# Patient Record
Sex: Male | Born: 1935 | Race: Black or African American | Hispanic: No | Marital: Married | Smoking: Former smoker
Health system: Southern US, Community
[De-identification: ages and names within clinical notes are randomized; demographics above are authoritative.]

## PROBLEM LIST (undated history)

## (undated) DIAGNOSIS — K219 Gastro-esophageal reflux disease without esophagitis: Secondary | ICD-10-CM

## (undated) DIAGNOSIS — R059 Cough, unspecified: Secondary | ICD-10-CM

## (undated) DIAGNOSIS — T8859XA Other complications of anesthesia, initial encounter: Secondary | ICD-10-CM

## (undated) DIAGNOSIS — R05 Cough: Secondary | ICD-10-CM

## (undated) DIAGNOSIS — T4145XA Adverse effect of unspecified anesthetic, initial encounter: Secondary | ICD-10-CM

## (undated) DIAGNOSIS — M1990 Unspecified osteoarthritis, unspecified site: Secondary | ICD-10-CM

## (undated) DIAGNOSIS — E039 Hypothyroidism, unspecified: Secondary | ICD-10-CM

## (undated) DIAGNOSIS — C61 Malignant neoplasm of prostate: Secondary | ICD-10-CM

## (undated) DIAGNOSIS — E079 Disorder of thyroid, unspecified: Secondary | ICD-10-CM

## (undated) DIAGNOSIS — I10 Essential (primary) hypertension: Secondary | ICD-10-CM

## (undated) HISTORY — PX: KNEE ARTHROSCOPY: SHX127

## (undated) HISTORY — PX: CERVICAL SPINE SURGERY: SHX589

## (undated) HISTORY — PX: BACK SURGERY: SHX140

## (undated) HISTORY — PX: SHOULDER SURGERY: SHX246

## (undated) HISTORY — PX: APPENDECTOMY: SHX54

## (undated) HISTORY — PX: TONSILLECTOMY: SUR1361

---

## 1998-03-03 ENCOUNTER — Ambulatory Visit (HOSPITAL_COMMUNITY): Admission: RE | Admit: 1998-03-03 | Discharge: 1998-03-03 | Payer: Self-pay | Admitting: Internal Medicine

## 1999-12-12 ENCOUNTER — Ambulatory Visit (HOSPITAL_COMMUNITY): Admission: RE | Admit: 1999-12-12 | Discharge: 1999-12-12 | Payer: Self-pay | Admitting: Neurosurgery

## 1999-12-12 ENCOUNTER — Encounter: Payer: Self-pay | Admitting: Neurosurgery

## 1999-12-27 ENCOUNTER — Ambulatory Visit (HOSPITAL_COMMUNITY): Admission: RE | Admit: 1999-12-27 | Discharge: 1999-12-27 | Payer: Self-pay | Admitting: Neurosurgery

## 1999-12-27 ENCOUNTER — Encounter: Payer: Self-pay | Admitting: Neurosurgery

## 2000-01-14 ENCOUNTER — Encounter: Payer: Self-pay | Admitting: Neurosurgery

## 2000-01-14 ENCOUNTER — Ambulatory Visit (HOSPITAL_COMMUNITY): Admission: RE | Admit: 2000-01-14 | Discharge: 2000-01-14 | Payer: Self-pay | Admitting: Neurosurgery

## 2000-01-24 ENCOUNTER — Encounter: Payer: Self-pay | Admitting: Neurosurgery

## 2000-01-24 ENCOUNTER — Ambulatory Visit (HOSPITAL_COMMUNITY): Admission: RE | Admit: 2000-01-24 | Discharge: 2000-01-24 | Payer: Self-pay | Admitting: Neurosurgery

## 2000-04-11 ENCOUNTER — Encounter: Payer: Self-pay | Admitting: Neurosurgery

## 2000-04-15 ENCOUNTER — Encounter: Payer: Self-pay | Admitting: Neurosurgery

## 2000-04-15 ENCOUNTER — Inpatient Hospital Stay (HOSPITAL_COMMUNITY): Admission: RE | Admit: 2000-04-15 | Discharge: 2000-04-17 | Payer: Self-pay | Admitting: Neurosurgery

## 2001-01-14 ENCOUNTER — Other Ambulatory Visit: Admission: RE | Admit: 2001-01-14 | Discharge: 2001-01-14 | Payer: Self-pay | Admitting: Gastroenterology

## 2001-01-14 ENCOUNTER — Encounter (INDEPENDENT_AMBULATORY_CARE_PROVIDER_SITE_OTHER): Payer: Self-pay | Admitting: Specialist

## 2001-04-29 ENCOUNTER — Encounter: Payer: Self-pay | Admitting: Neurosurgery

## 2001-04-29 ENCOUNTER — Encounter: Admission: RE | Admit: 2001-04-29 | Discharge: 2001-04-29 | Payer: Self-pay | Admitting: Neurosurgery

## 2003-03-16 ENCOUNTER — Encounter: Payer: Self-pay | Admitting: Neurosurgery

## 2003-03-16 ENCOUNTER — Ambulatory Visit (HOSPITAL_COMMUNITY): Admission: RE | Admit: 2003-03-16 | Discharge: 2003-03-16 | Payer: Self-pay | Admitting: Neurosurgery

## 2003-03-29 ENCOUNTER — Encounter: Admission: RE | Admit: 2003-03-29 | Discharge: 2003-03-29 | Payer: Self-pay | Admitting: Neurosurgery

## 2003-03-29 ENCOUNTER — Encounter: Payer: Self-pay | Admitting: Neurosurgery

## 2003-03-31 ENCOUNTER — Encounter: Payer: Self-pay | Admitting: Neurosurgery

## 2003-03-31 ENCOUNTER — Ambulatory Visit (HOSPITAL_COMMUNITY): Admission: RE | Admit: 2003-03-31 | Discharge: 2003-03-31 | Payer: Self-pay | Admitting: Neurosurgery

## 2003-04-11 ENCOUNTER — Encounter: Payer: Self-pay | Admitting: Neurosurgery

## 2003-04-11 ENCOUNTER — Encounter: Admission: RE | Admit: 2003-04-11 | Discharge: 2003-04-11 | Payer: Self-pay | Admitting: Neurosurgery

## 2003-04-25 ENCOUNTER — Encounter: Payer: Self-pay | Admitting: Internal Medicine

## 2003-04-25 ENCOUNTER — Ambulatory Visit (HOSPITAL_COMMUNITY): Admission: RE | Admit: 2003-04-25 | Discharge: 2003-04-25 | Payer: Self-pay | Admitting: Internal Medicine

## 2003-06-20 ENCOUNTER — Ambulatory Visit (HOSPITAL_COMMUNITY): Admission: RE | Admit: 2003-06-20 | Discharge: 2003-06-20 | Payer: Self-pay | Admitting: Neurology

## 2003-06-29 ENCOUNTER — Encounter: Admission: RE | Admit: 2003-06-29 | Discharge: 2003-06-29 | Payer: Self-pay | Admitting: Neurosurgery

## 2003-07-26 ENCOUNTER — Inpatient Hospital Stay (HOSPITAL_COMMUNITY): Admission: RE | Admit: 2003-07-26 | Discharge: 2003-08-01 | Payer: Self-pay | Admitting: Neurosurgery

## 2003-10-12 ENCOUNTER — Ambulatory Visit (HOSPITAL_COMMUNITY): Admission: RE | Admit: 2003-10-12 | Discharge: 2003-10-12 | Payer: Self-pay | Admitting: Internal Medicine

## 2003-10-18 ENCOUNTER — Ambulatory Visit (HOSPITAL_BASED_OUTPATIENT_CLINIC_OR_DEPARTMENT_OTHER): Admission: RE | Admit: 2003-10-18 | Discharge: 2003-10-18 | Payer: Self-pay | Admitting: Internal Medicine

## 2003-10-27 ENCOUNTER — Inpatient Hospital Stay (HOSPITAL_COMMUNITY): Admission: RE | Admit: 2003-10-27 | Discharge: 2003-10-28 | Payer: Self-pay | Admitting: Urology

## 2003-10-27 ENCOUNTER — Encounter (INDEPENDENT_AMBULATORY_CARE_PROVIDER_SITE_OTHER): Payer: Self-pay | Admitting: Specialist

## 2004-04-10 ENCOUNTER — Ambulatory Visit (HOSPITAL_COMMUNITY): Admission: RE | Admit: 2004-04-10 | Discharge: 2004-04-10 | Payer: Self-pay | Admitting: Gastroenterology

## 2004-11-22 ENCOUNTER — Encounter: Admission: RE | Admit: 2004-11-22 | Discharge: 2004-11-22 | Payer: Self-pay | Admitting: Neurology

## 2005-03-03 ENCOUNTER — Encounter: Admission: RE | Admit: 2005-03-03 | Discharge: 2005-03-03 | Payer: Self-pay | Admitting: Neurology

## 2005-06-25 ENCOUNTER — Encounter: Admission: RE | Admit: 2005-06-25 | Discharge: 2005-06-25 | Payer: Self-pay | Admitting: Internal Medicine

## 2005-07-06 ENCOUNTER — Encounter: Admission: RE | Admit: 2005-07-06 | Discharge: 2005-07-06 | Payer: Self-pay | Admitting: Internal Medicine

## 2005-07-11 ENCOUNTER — Ambulatory Visit (HOSPITAL_COMMUNITY): Admission: RE | Admit: 2005-07-11 | Discharge: 2005-07-12 | Payer: Self-pay | Admitting: Orthopedic Surgery

## 2006-01-14 ENCOUNTER — Encounter: Admission: RE | Admit: 2006-01-14 | Discharge: 2006-01-14 | Payer: Self-pay | Admitting: Anesthesiology

## 2006-05-30 ENCOUNTER — Encounter: Admission: RE | Admit: 2006-05-30 | Discharge: 2006-05-30 | Payer: Self-pay | Admitting: Internal Medicine

## 2006-06-23 ENCOUNTER — Encounter: Admission: RE | Admit: 2006-06-23 | Discharge: 2006-06-23 | Payer: Self-pay | Admitting: Internal Medicine

## 2006-07-16 ENCOUNTER — Encounter: Admission: RE | Admit: 2006-07-16 | Discharge: 2006-07-16 | Payer: Self-pay | Admitting: Internal Medicine

## 2006-07-21 ENCOUNTER — Ambulatory Visit: Payer: Self-pay | Admitting: Internal Medicine

## 2006-08-08 ENCOUNTER — Ambulatory Visit (HOSPITAL_COMMUNITY): Admission: RE | Admit: 2006-08-08 | Discharge: 2006-08-08 | Payer: Self-pay | Admitting: Thoracic Surgery

## 2006-08-11 ENCOUNTER — Ambulatory Visit (HOSPITAL_COMMUNITY): Admission: RE | Admit: 2006-08-11 | Discharge: 2006-08-11 | Payer: Self-pay | Admitting: Thoracic Surgery

## 2006-09-04 ENCOUNTER — Ambulatory Visit: Payer: Self-pay | Admitting: Pulmonary Disease

## 2006-09-04 LAB — CONVERTED CEMR LAB
ALT: 18 units/L (ref 0–40)
AST: 43 units/L — ABNORMAL HIGH (ref 0–37)
Alkaline Phosphatase: 54 units/L (ref 39–117)
BUN: 26 mg/dL — ABNORMAL HIGH (ref 6–23)
Basophils Relative: 0.5 % (ref 0.0–1.0)
Calcium: 9.4 mg/dL (ref 8.4–10.5)
Eosinophils Relative: 0 % (ref 0.0–5.0)
GFR calc Af Amer: 56 mL/min
GFR calc non Af Amer: 46 mL/min
Lymphocytes Relative: 3.7 % — ABNORMAL LOW (ref 12.0–46.0)
Neutro Abs: 26.8 10*3/uL — ABNORMAL HIGH (ref 1.4–7.7)
Platelets: 240 10*3/uL (ref 150–400)
Potassium: 3.9 meq/L (ref 3.5–5.1)
RBC: 4.52 M/uL (ref 4.22–5.81)
Total Protein: 6.8 g/dL (ref 6.0–8.3)
WBC: 30.2 10*3/uL (ref 4.5–10.5)

## 2006-09-18 ENCOUNTER — Ambulatory Visit: Payer: Self-pay | Admitting: Internal Medicine

## 2006-09-18 LAB — CONVERTED CEMR LAB
BUN: 9 mg/dL (ref 6–23)
Basophils Relative: 0.6 % (ref 0.0–1.0)
CO2: 31 meq/L (ref 19–32)
Calcium: 8.7 mg/dL (ref 8.4–10.5)
Chloride: 106 meq/L (ref 96–112)
Eosinophils Relative: 0.4 % (ref 0.0–5.0)
GFR calc non Af Amer: 119 mL/min
Glucose, Bld: 113 mg/dL — ABNORMAL HIGH (ref 70–99)
Platelets: 413 10*3/uL — ABNORMAL HIGH (ref 150–400)
RBC: 3.85 M/uL — ABNORMAL LOW (ref 4.22–5.81)
WBC: 10.6 10*3/uL — ABNORMAL HIGH (ref 4.5–10.5)

## 2006-09-22 ENCOUNTER — Ambulatory Visit: Payer: Self-pay | Admitting: Cardiology

## 2006-09-27 ENCOUNTER — Ambulatory Visit: Payer: Self-pay | Admitting: Cardiothoracic Surgery

## 2006-09-27 ENCOUNTER — Inpatient Hospital Stay (HOSPITAL_COMMUNITY): Admission: EM | Admit: 2006-09-27 | Discharge: 2006-10-06 | Payer: Self-pay | Admitting: Emergency Medicine

## 2006-09-29 ENCOUNTER — Encounter (INDEPENDENT_AMBULATORY_CARE_PROVIDER_SITE_OTHER): Payer: Self-pay | Admitting: Specialist

## 2006-09-30 ENCOUNTER — Encounter: Payer: Self-pay | Admitting: Thoracic Surgery

## 2006-10-08 ENCOUNTER — Ambulatory Visit: Payer: Self-pay | Admitting: Thoracic Surgery

## 2006-10-08 ENCOUNTER — Encounter: Admission: RE | Admit: 2006-10-08 | Discharge: 2006-10-08 | Payer: Self-pay | Admitting: Thoracic Surgery

## 2006-10-14 ENCOUNTER — Ambulatory Visit: Payer: Self-pay | Admitting: Thoracic Surgery

## 2006-10-14 ENCOUNTER — Encounter: Admission: RE | Admit: 2006-10-14 | Discharge: 2006-10-14 | Payer: Self-pay | Admitting: Thoracic Surgery

## 2006-10-15 ENCOUNTER — Inpatient Hospital Stay (HOSPITAL_COMMUNITY): Admission: AD | Admit: 2006-10-15 | Discharge: 2006-10-18 | Payer: Self-pay | Admitting: Thoracic Surgery

## 2006-10-29 ENCOUNTER — Ambulatory Visit: Payer: Self-pay | Admitting: Thoracic Surgery

## 2006-10-29 ENCOUNTER — Encounter: Admission: RE | Admit: 2006-10-29 | Discharge: 2006-10-29 | Payer: Self-pay | Admitting: Thoracic Surgery

## 2006-11-05 ENCOUNTER — Ambulatory Visit: Payer: Self-pay | Admitting: Thoracic Surgery

## 2006-11-05 ENCOUNTER — Encounter: Admission: RE | Admit: 2006-11-05 | Discharge: 2006-11-05 | Payer: Self-pay | Admitting: Thoracic Surgery

## 2006-11-19 ENCOUNTER — Ambulatory Visit: Payer: Self-pay | Admitting: Thoracic Surgery

## 2006-11-19 ENCOUNTER — Encounter: Admission: RE | Admit: 2006-11-19 | Discharge: 2006-11-19 | Payer: Self-pay | Admitting: Thoracic Surgery

## 2006-12-10 ENCOUNTER — Ambulatory Visit: Payer: Self-pay | Admitting: Thoracic Surgery

## 2006-12-10 ENCOUNTER — Encounter: Admission: RE | Admit: 2006-12-10 | Discharge: 2006-12-10 | Payer: Self-pay | Admitting: Thoracic Surgery

## 2006-12-31 ENCOUNTER — Encounter: Admission: RE | Admit: 2006-12-31 | Discharge: 2006-12-31 | Payer: Self-pay | Admitting: Thoracic Surgery

## 2006-12-31 ENCOUNTER — Ambulatory Visit: Payer: Self-pay | Admitting: Thoracic Surgery

## 2007-01-28 ENCOUNTER — Ambulatory Visit: Payer: Self-pay | Admitting: Thoracic Surgery

## 2007-03-25 ENCOUNTER — Ambulatory Visit: Payer: Self-pay | Admitting: Thoracic Surgery

## 2007-03-25 ENCOUNTER — Encounter: Admission: RE | Admit: 2007-03-25 | Discharge: 2007-03-25 | Payer: Self-pay | Admitting: Thoracic Surgery

## 2007-08-13 HISTORY — PX: LUNG SURGERY: SHX703

## 2008-05-24 ENCOUNTER — Encounter: Admission: RE | Admit: 2008-05-24 | Discharge: 2008-08-11 | Payer: Self-pay | Admitting: Internal Medicine

## 2008-06-15 ENCOUNTER — Ambulatory Visit (HOSPITAL_COMMUNITY): Admission: RE | Admit: 2008-06-15 | Discharge: 2008-06-15 | Payer: Self-pay | Admitting: Internal Medicine

## 2008-08-12 HISTORY — PX: EYE SURGERY: SHX253

## 2009-02-24 ENCOUNTER — Inpatient Hospital Stay (HOSPITAL_COMMUNITY): Admission: RE | Admit: 2009-02-24 | Discharge: 2009-02-26 | Payer: Self-pay | Admitting: Neurosurgery

## 2009-04-26 ENCOUNTER — Encounter: Admission: RE | Admit: 2009-04-26 | Discharge: 2009-04-26 | Payer: Self-pay | Admitting: Thoracic Surgery

## 2009-04-26 ENCOUNTER — Ambulatory Visit: Payer: Self-pay | Admitting: Thoracic Surgery

## 2009-07-10 ENCOUNTER — Encounter: Admission: RE | Admit: 2009-07-10 | Discharge: 2009-07-10 | Payer: Self-pay | Admitting: Neurosurgery

## 2009-07-27 ENCOUNTER — Encounter: Admission: RE | Admit: 2009-07-27 | Discharge: 2009-07-27 | Payer: Self-pay | Admitting: Neurosurgery

## 2009-08-17 ENCOUNTER — Encounter: Admission: RE | Admit: 2009-08-17 | Discharge: 2009-08-17 | Payer: Self-pay | Admitting: Neurosurgery

## 2010-04-25 ENCOUNTER — Encounter: Admission: RE | Admit: 2010-04-25 | Discharge: 2010-04-25 | Payer: Self-pay | Admitting: Neurosurgery

## 2010-09-01 ENCOUNTER — Encounter: Payer: Self-pay | Admitting: Internal Medicine

## 2010-09-02 ENCOUNTER — Encounter: Payer: Self-pay | Admitting: Urology

## 2010-09-06 ENCOUNTER — Encounter
Admission: RE | Admit: 2010-09-06 | Discharge: 2010-09-06 | Payer: Self-pay | Source: Home / Self Care | Attending: Orthopaedic Surgery | Admitting: Orthopaedic Surgery

## 2010-10-20 ENCOUNTER — Emergency Department (HOSPITAL_COMMUNITY): Payer: Medicare Other

## 2010-10-20 ENCOUNTER — Inpatient Hospital Stay (HOSPITAL_COMMUNITY)
Admission: EM | Admit: 2010-10-20 | Discharge: 2010-10-26 | DRG: 689 | Disposition: A | Discharge status: Home Health | Payer: Medicare Other | Attending: Internal Medicine | Admitting: Internal Medicine

## 2010-10-20 DIAGNOSIS — R7301 Impaired fasting glucose: Secondary | ICD-10-CM | POA: Diagnosis present

## 2010-10-20 DIAGNOSIS — J189 Pneumonia, unspecified organism: Secondary | ICD-10-CM | POA: Diagnosis present

## 2010-10-20 DIAGNOSIS — D1809 Hemangioma of other sites: Secondary | ICD-10-CM | POA: Diagnosis present

## 2010-10-20 DIAGNOSIS — D638 Anemia in other chronic diseases classified elsewhere: Secondary | ICD-10-CM | POA: Diagnosis present

## 2010-10-20 DIAGNOSIS — Z66 Do not resuscitate: Secondary | ICD-10-CM | POA: Diagnosis present

## 2010-10-20 DIAGNOSIS — R627 Adult failure to thrive: Secondary | ICD-10-CM | POA: Diagnosis present

## 2010-10-20 DIAGNOSIS — E559 Vitamin D deficiency, unspecified: Secondary | ICD-10-CM | POA: Diagnosis present

## 2010-10-20 DIAGNOSIS — N390 Urinary tract infection, site not specified: Principal | ICD-10-CM | POA: Diagnosis present

## 2010-10-20 DIAGNOSIS — E860 Dehydration: Secondary | ICD-10-CM | POA: Diagnosis present

## 2010-10-20 DIAGNOSIS — A498 Other bacterial infections of unspecified site: Secondary | ICD-10-CM | POA: Diagnosis present

## 2010-10-20 DIAGNOSIS — I10 Essential (primary) hypertension: Secondary | ICD-10-CM | POA: Diagnosis present

## 2010-10-20 DIAGNOSIS — E876 Hypokalemia: Secondary | ICD-10-CM | POA: Diagnosis not present

## 2010-10-20 DIAGNOSIS — E039 Hypothyroidism, unspecified: Secondary | ICD-10-CM | POA: Diagnosis present

## 2010-10-20 DIAGNOSIS — N400 Enlarged prostate without lower urinary tract symptoms: Secondary | ICD-10-CM | POA: Diagnosis present

## 2010-10-20 DIAGNOSIS — R42 Dizziness and giddiness: Secondary | ICD-10-CM | POA: Diagnosis not present

## 2010-10-20 DIAGNOSIS — R5381 Other malaise: Secondary | ICD-10-CM | POA: Diagnosis present

## 2010-10-20 DIAGNOSIS — C61 Malignant neoplasm of prostate: Secondary | ICD-10-CM | POA: Diagnosis present

## 2010-10-20 HISTORY — DX: Malignant neoplasm of prostate: C61

## 2010-10-20 LAB — URINALYSIS, ROUTINE W REFLEX MICROSCOPIC
Glucose, UA: NEGATIVE mg/dL
Specific Gravity, Urine: 1.019 (ref 1.005–1.030)
Urobilinogen, UA: 1 mg/dL (ref 0.0–1.0)

## 2010-10-20 LAB — COMPREHENSIVE METABOLIC PANEL
AST: 32 U/L (ref 0–37)
Albumin: 2.8 g/dL — ABNORMAL LOW (ref 3.5–5.2)
BUN: 26 mg/dL — ABNORMAL HIGH (ref 6–23)
Calcium: 8.7 mg/dL (ref 8.4–10.5)
Creatinine, Ser: 1.28 mg/dL (ref 0.4–1.5)
GFR calc Af Amer: >60 mL/min (ref >60)
Total Bilirubin: 1.1 mg/dL (ref 0.3–1.2)
Total Protein: 6.4 g/dL (ref 6.0–8.3)

## 2010-10-20 LAB — CBC
MCH: 28.8 pg (ref 26.0–34.0)
MCHC: 32 g/dL (ref 30.0–36.0)
MCV: 90 fL (ref 78.0–100.0)
Platelets: 179 10*3/uL (ref 150–400)
RBC: 4.1 MIL/uL — ABNORMAL LOW (ref 4.22–5.81)
RDW: 13.7 % (ref 11.5–15.5)

## 2010-10-20 LAB — URINE MICROSCOPIC-ADD ON

## 2010-10-20 LAB — DIFFERENTIAL
Basophils Relative: 0 % (ref 0–1)
Eosinophils Absolute: 0 10*3/uL (ref 0.0–0.7)
Eosinophils Relative: 0 % (ref 0–5)
Lymphs Abs: 0.9 10*3/uL (ref 0.7–4.0)
Monocytes Absolute: 2.6 10*3/uL — ABNORMAL HIGH (ref 0.1–1.0)
Monocytes Relative: 12 % (ref 3–12)
Neutrophils Relative %: 84 % — ABNORMAL HIGH (ref 43–77)

## 2010-10-21 ENCOUNTER — Inpatient Hospital Stay (HOSPITAL_COMMUNITY): Payer: Medicare Other

## 2010-10-22 LAB — PHOSPHORUS: Phosphorus: 2.6 mg/dL (ref 2.3–4.6)

## 2010-10-22 LAB — CBC
MCH: 28.2 pg (ref 26.0–34.0)
Platelets: 190 10*3/uL (ref 150–400)
RBC: 4.32 MIL/uL (ref 4.22–5.81)
RDW: 13.4 % (ref 11.5–15.5)
WBC: 12.5 10*3/uL — ABNORMAL HIGH (ref 4.0–10.5)

## 2010-10-22 LAB — BASIC METABOLIC PANEL
Calcium: 8.4 mg/dL (ref 8.4–10.5)
Chloride: 100 mEq/L (ref 96–112)
Creatinine, Ser: 0.78 mg/dL (ref 0.4–1.5)
GFR calc Af Amer: >60 mL/min (ref >60)
GFR calc non Af Amer: >60 mL/min (ref >60)

## 2010-10-22 LAB — POTASSIUM: Potassium: 3.6 mEq/L (ref 3.5–5.1)

## 2010-10-22 LAB — MAGNESIUM: Magnesium: 2.3 mg/dL (ref 1.5–2.5)

## 2010-10-23 LAB — CBC
MCH: 27.9 pg (ref 26.0–34.0)
MCHC: 31.9 g/dL (ref 30.0–36.0)
Platelets: 208 10*3/uL (ref 150–400)
RDW: 13.6 % (ref 11.5–15.5)

## 2010-10-23 LAB — BASIC METABOLIC PANEL
Calcium: 8.3 mg/dL — ABNORMAL LOW (ref 8.4–10.5)
Creatinine, Ser: 0.77 mg/dL (ref 0.4–1.5)
GFR calc Af Amer: >60 mL/min (ref >60)
GFR calc non Af Amer: >60 mL/min (ref >60)
Sodium: 135 mEq/L (ref 135–145)

## 2010-10-23 LAB — URINE CULTURE: Colony Count: >=100000

## 2010-10-23 LAB — IRON AND TIBC: Iron: 36 ug/dL — ABNORMAL LOW (ref 42–135)

## 2010-10-23 LAB — FERRITIN: Ferritin: 202 ng/mL (ref 22–322)

## 2010-10-23 LAB — HEMOCCULT GUIAC POC 1CARD (OFFICE): Fecal Occult Bld: NEGATIVE

## 2010-10-24 ENCOUNTER — Inpatient Hospital Stay (HOSPITAL_COMMUNITY): Payer: Medicare Other

## 2010-10-24 LAB — RPR: RPR Ser Ql: NONREACTIVE

## 2010-10-24 LAB — BASIC METABOLIC PANEL
BUN: 24 mg/dL — ABNORMAL HIGH (ref 6–23)
Chloride: 103 mEq/L (ref 96–112)
GFR calc non Af Amer: >60 mL/min (ref >60)
Glucose, Bld: 131 mg/dL — ABNORMAL HIGH (ref 70–99)
Potassium: 3.4 mEq/L — ABNORMAL LOW (ref 3.5–5.1)
Sodium: 136 mEq/L (ref 135–145)

## 2010-10-24 LAB — CBC
HCT: 36.5 % — ABNORMAL LOW (ref 39.0–52.0)
MCV: 87.5 fL (ref 78.0–100.0)
Platelets: 225 10*3/uL (ref 150–400)
RBC: 4.17 MIL/uL — ABNORMAL LOW (ref 4.22–5.81)
RDW: 13.6 % (ref 11.5–15.5)
WBC: 8.7 10*3/uL (ref 4.0–10.5)

## 2010-10-25 ENCOUNTER — Encounter (HOSPITAL_COMMUNITY): Payer: Self-pay | Admitting: Radiology

## 2010-10-25 ENCOUNTER — Inpatient Hospital Stay (HOSPITAL_COMMUNITY): Payer: Medicare Other

## 2010-10-25 LAB — BASIC METABOLIC PANEL
BUN: 22 mg/dL (ref 6–23)
CO2: 29 mEq/L (ref 19–32)
Calcium: 8.6 mg/dL (ref 8.4–10.5)
Creatinine, Ser: 1.01 mg/dL (ref 0.4–1.5)
Glucose, Bld: 115 mg/dL — ABNORMAL HIGH (ref 70–99)

## 2010-10-25 LAB — CBC
HCT: 36.6 % — ABNORMAL LOW (ref 39.0–52.0)
Hemoglobin: 11.8 g/dL — ABNORMAL LOW (ref 13.0–17.0)
MCH: 28.6 pg (ref 26.0–34.0)
MCHC: 32.2 g/dL (ref 30.0–36.0)

## 2010-10-25 MED ORDER — IOHEXOL 300 MG/ML  SOLN
80.0000 mL | Freq: Once | INTRAMUSCULAR | Status: AC | PRN
  Administered 2010-10-25: 80 mL via INTRAVENOUS

## 2010-10-27 LAB — CULTURE, BLOOD (ROUTINE X 2)
Culture  Setup Time: 201203111137
Culture  Setup Time: 201203111137
Culture: NO GROWTH

## 2010-10-31 ENCOUNTER — Other Ambulatory Visit: Payer: Self-pay | Admitting: Internal Medicine

## 2010-10-31 DIAGNOSIS — R932 Abnormal findings on diagnostic imaging of liver and biliary tract: Secondary | ICD-10-CM

## 2010-11-03 NOTE — H&P (Signed)
[name], [name]                ACCOUNT NO.:  [id number]  MEDICAL RECORD NO.:  [id number]           PATIENT TYPE:  E  LOCATION:  WLED                         FACILITY:  [facility]  PHYSICIAN:  Calvert Cantor, M.D.     DATE OF BIRTH:  04/01/1936  DATE OF ADMISSION:  10/20/2010 DATE OF DISCHARGE:                             HISTORY & PHYSICAL   PRIMARY CARE PHYSICIAN:  Theressa Millard, MD  PRESENTING COMPLAINT:  Severe generalized weakness.  HISTORY OF PRESENT ILLNESS:  This is a 75 year old male with hypertension, hypothyroidism, anxiety, prostate cancer which is not currently being treated and BPH.  The patient is brought in essentially by his wife who states that he has been very weak.  Weakness has been going on for about a week but has been worse in the past few days. Today he sank down to the floor in the bathroom and then could not get up.  He was brought to the hospital by EMS.  He has also noticed a foul odor to his urine over the past couple of days.  He is found to have a urinary tract infection.  In going over the review of systems, they also mentioned that he has had a cough for the past 2 months and on listening to him I hear crackles in the right lower lobe.  I have ordered a chest x-ray and he is found to have a community-acquired pneumonia as well.  His cough is mostly nonproductive.  He has not had any chest pain.  He is always short of breath on exertion.  Other than the urine being foul- smelling, he has not had any flank pain or suprapubic pain.  PAST MEDICAL HISTORY: 1. Hypertension. 2. Hypothyroidism. 3. Anxiety. 4. Prostate cancer being followed by Dr. Patsi Sears, not yet required     treatment. 5. BPH which I am suspecting since he is on medications for it. 6. He has neuropathy in his feet.  PAST SURGICAL HISTORY: 1. Two mid back surgeries. 2. One C spine surgery for removal of spur. 3. Two right rotator cuff surgeries.  SOCIAL HISTORY:  He was a  smoker, stopped in 1983, does not drink alcohol.  He is married, lives with his wife.  Retired from working in [employer], [employer].  FAMILY HISTORY:  Mother had breast cancer, rheumatoid arthritis, and died of an MI.  Father died of an MI.  ALLERGIES:  No known drug allergies.  MEDICATIONS PER MED REC: 1. Vitamin D2 50,000 units weekly on Friday. 2. VESIcare 10 mg daily. 3. Synthroid 75 mcg Saturday and Sunday only. 4. Synthroid 50 mcg on Monday through Friday. 5. Senna 2 tablets daily at bedtime. 6. Roxicodone 15 mg 2 tablets q.6 h. as needed. 7. Omeprazole 20 mg twice a day. 8. Micardis/HCTZ 80/25, 1 tablet daily. 9. Metoprolol 25 mg twice a day. 10.Ibuprofen 800 mg 2 tablets q.6 h. as needed. 11.Gabapentin 300 mg 4 times a day. 12.Diazepam 5 mg every evening. 13.Benzonatate 100 mg twice a day. 14.Avodart 0.5 mg daily. 15.Ativan 1 mg twice a day. 16.Amlodipine 5 mg daily.  REVIEW OF SYSTEMS:  GENERAL:  No recent weight loss, weight gain.  No frequent headaches.  No fever, chills, or sweats.  Positive for fatigue. HEENT:  He has had cataract surgery.  No blurred vision, double vision. No sore throat, sinus trouble, earache.  RESPIRATORY:  Positive for dry cough and shortness of breath on exertion.  The shortness of breath is chronic.  No wheezing.  CARDIAC:  No chest pain, palpitations, or pedal edema.  No orthopnea.  GI:  He is slightly constipated, has not had a BM in 2 days.  Otherwise no nausea, vomiting, or abdominal pain.  GU:  Foul- smelling urine.  No blood in urine.  HEMATOLOGIC:  No easy bruising. SKIN:  Has discoloration and itching on his upper back according to the wife.  NEUROLOGICAL:  Neuropathy in his feet with numbness and tingling. No history of stroke or seizure.  Occasional numbness and tingling in his fingertips as well.  PSYCHOLOGICAL:  He has anxiety. MUSCULOSKELETAL:  Has chronic mid back pain which has been worse this past week.  His wife has been  applying icy hot patches to it.  PHYSICAL EXAMINATION:  GENERAL:  Elderly man lying in bed in no acute distress. VITAL SIGNS:  Blood pressure 100/59, pulse 89, respiratory rate 16, temperature 99.2, oxygen saturation is 94% on 2 liters of oxygen. HEENT:  Pupils are equal, round, reactive to light.  Extraocular movements are intact.  Conjunctivae is pink.  No scleral icterus.  Oral mucosa is moist.  Oropharynx clear. NECK:  Supple.  No thyromegaly, lymphadenopathy, or carotid bruits. HEART:  Slightly irregular rate and rhythm.  No murmurs, rubs, or gallops. LUNGS:  Crackles in the right lower lung field.  No wheezing.  No rhonchi.  Normal respiratory effort. ABDOMEN:  Soft, nontender, nondistended.  Bowel sounds positive.  No organomegaly. EXTREMITIES:  No cyanosis, clubbing, or edema.  Pedal pulses are difficult to palpate. NEUROLOGICAL:  Cranial nerves II-XII intact.  Strength intact in all 4 extremities. PSYCHOLOGICAL:  He is awake, alert, oriented x3.  Mood and affect normal, slow to answer questions and minimizes his recent problems. Wife is a better historian. SKIN:  Warm, dry.  He has hyperpigmentation on his upper back but no new rash.  BLOOD WORK:  WBC count is 21, hemoglobin 11.8, hematocrit 36.9, platelets 179,000.  Metabolic panel reveals a BUN of 26, creatinine of 1.28.  Rest of his metabolic panel is normal.  UA reveals large leukocyte esterase negative for nitrite, too numerous to count WBCs, and many bacteria, specific gravity 1.019.  There is a small amount of protein at 30 mg per dL and urobilinogen at 1 mg per dL.  Chest x-ray reveals infiltrate in the right perihilar upper lobe area concerning for pneumonia.  There is borderline cardiomegaly and pulmonary vascular congestion.  A small right pleural effusion is also present.  EKG:  EKG reveals PACs and T-wave inversions in leads II, III, and aVF which were not present on an old EKG.  There is also a lot of  artifact.  ASSESSMENT/PLAN: 1. Community-acquired pneumonia.  We will treat this with Avelox. 2. Urinary tract infection.  He has received Cipro in the ER as     mentioned above.  I will be giving him Avelox.  Follow up on urine     culture. 3. Dehydration based on BUN creatinine ratio.  We will give him normal     saline at 100 mL an hour and hold off on giving him more.  I do not  hear any diffuse crackles suggesting any obvious pulmonary edema. 4. Enlarged heart with mild pulmonary congestion.  We will get a 2-D     echo.  Abnormal EKG with ischemic changes noted in lead II, III,     and aVF.  I will start him on a baby aspirin daily.  We will     continue his beta-blocker as tolerated based on blood pressure and     he will need outpatient stress test.  For now I will get a echo     while he is here. 5. Hypotension.  We will need to be careful in giving him his     medications.  I will place holding parameters on them. 6. Benign prostatic hypertrophy. 7. Hypothyroidism.  Check TSH and free T4. 8. Anxiety. 9. The patient would like to be a do not intubate. 10.Deep venous thrombosis prophylaxis with Lovenox.  Time on admission was 70 minutes.     Calvert Cantor, M.D.     SR/MEDQ  D:  10/20/2010  T:  10/20/2010  Job:  [id number]  cc:   Theressa Millard, M.D. Fax: [phone]  Electronically Signed by Calvert Cantor M.D. on 11/03/2010 05:02:55 PM

## 2010-11-07 ENCOUNTER — Ambulatory Visit
Admission: RE | Admit: 2010-11-07 | Discharge: 2010-11-07 | Disposition: A | Discharge status: Home / Self Care | Payer: Medicare Other | Source: Ambulatory Visit | Attending: Internal Medicine | Admitting: Internal Medicine

## 2010-11-07 DIAGNOSIS — R932 Abnormal findings on diagnostic imaging of liver and biliary tract: Secondary | ICD-10-CM

## 2010-11-07 MED ORDER — GADOBENATE DIMEGLUMINE 529 MG/ML IV SOLN
20.0000 mL | Freq: Once | INTRAVENOUS | Status: AC | PRN
  Administered 2010-11-07: 20 mL via INTRAVENOUS

## 2010-11-13 NOTE — Discharge Summary (Signed)
[name], [name]                ACCOUNT NO.:  [id number]  MEDICAL RECORD NO.:  [id number]           PATIENT TYPE:  I  LOCATION:  1318                         FACILITY:  [facility]  PHYSICIAN:  Theressa Millard, M.D.    DATE OF BIRTH:  December 08, 1935  DATE OF ADMISSION:  10/20/2010 DATE OF DISCHARGE:  10/26/2010                              DISCHARGE SUMMARY   ADMITTING DIAGNOSES: 1. Failure to thrive. 2. Weakness. 3. Inability to ambulate.  DISCHARGE DIAGNOSES: 1. Urinary tract infection with Escherichia Coli sensitive to almost     everything. 2. Pneumonitis. 3. Hemangioma versus other lesion of the liver. 4. Recent left shoulder surgery. 5. Hypertension. 6. Prostate cancer, incidentally found on TURP in 2004. 7. Hypothyroidism. 8. Vitamin D deficiency. 9. History of empyema. 10.Impaired fasting glucose. 11.History of colon polyps.  The patient is a 75 year old black male who was admitted with a rather profound weakness.  He was unable to ambulate.  HOSPITAL COURSE:  The patient was brought to the emergency room in evaluation.  He had evidence of pneumonia as well as urinary tract infection.  He was started on broad-spectrum antibiotics.  Over the subsequent several days, his urinary culture grew out E-coli which was pansensitive.  He also was noted to have infiltrate as indicated.  I did notice that he had an old chest x-ray in our office that seemed to sound, by description, the same as his current chest x-ray, so I had the patient undergo a CT scan of the chest.  This indeed showed evidence of a pneumonitis.  I asked Radiology to make comparisons between all studies and the lesions at the present time look new.  Therefore, it was concluded that he does have pneumonia.  Levaquin was thought to be adequate for both illnesses and he was changed to oral Levaquin.  He worked with Physical Therapy and got somewhat stronger and seemed to be able to get about his activities of  daily living at home.  He was discharged in improved condition.  CT scan of the chest did also show a liver lesion that was probably a hemangioma, but MRI was recommended to confirm.  DISCHARGE MEDICATIONS: 1. Levaquin 500 mg daily for 7 days. 2. Amlodipine 5 mg daily. 3. Ativan 1 mg b.i.d. 4. Avodart 0.5 mg daily. 5. Tessalon 100 mg twice daily. 6. Diazepam 5 mg q.h.s. p.r.n. 7. Gabapentin 300 mg 2 capsules 4 times a day. 8. Ibuprofen 800 mg p.r.n. 9. Metoprolol tartrate 25 mg 1 b.i.d. 10.Micardis HCT 80/25 once daily. 11.Omeprazole 20 mg daily. 12.Roxicodone 15 mg 2 tablets q.6 hours. 13.Senokot 2 tablets daily as needed for constipation. 14.Synthroid 0.5 mg 5 times a week. 15.Synthroid 0.075 mg 1 tablet 2 days a week. 16.Vitamin D 50,000 units 1 tablet weekly.  PLAN:  We will call and make an appointment for the patient.  We will also arrange the MRI of his liver.  He will get home health physical therapy and we will need to make arrangements for him to return to regular physical therapy once he is no longer homebound.     Theressa Millard, M.D.  JO/MEDQ  D:  10/28/2010  T:  10/29/2010  Job:  [id number]  Electronically Signed by Theressa Millard M.D. on 11/13/2010 08:09:20 AM

## 2010-11-18 LAB — BASIC METABOLIC PANEL
BUN: 8 mg/dL (ref 6–23)
Calcium: 9.2 mg/dL (ref 8.4–10.5)
GFR calc non Af Amer: >60 mL/min (ref >60)
Glucose, Bld: 108 mg/dL — ABNORMAL HIGH (ref 70–99)
Potassium: 3.9 mEq/L (ref 3.5–5.1)

## 2010-11-18 LAB — CBC
HCT: 40.7 % (ref 39.0–52.0)
Platelets: 155 10*3/uL (ref 150–400)
RDW: 13.9 % (ref 11.5–15.5)

## 2010-12-25 NOTE — H&P (Signed)
NAMEMARYLAND, LUPPINO NO.:  [id number]   MEDICAL RECORD NO.:  [id number]          PATIENT TYPE:  INP   LOCATION:  3025                         FACILITY:  [facility]   PHYSICIAN:  Hilda Lias, M.D.   DATE OF BIRTH:  04/15/1936   DATE OF ADMISSION:  02/24/2009  DATE OF DISCHARGE:                              HISTORY & PHYSICAL   Mr. Detienne is a gentleman, who in the past underwent surgery at the level  of L3-L4.  I had been following him for quite a long while.  Lately, he  had been complaining more stiffing of the neck.  For the past year, he  has some difficulty swallowing.  He had been seen by a  gastroenterologist.  He has barium swallow.  I saw him and after  reviewing barium swallow, we found that indeed the problem with  difficulty swallowing was secondary to large osteophyte.   PAST MEDICAL HISTORY:  Lumbar fusion.   PAST SURGICAL HISTORY:  Rotator cuff surgery.   ALLERGIES:  He is not allergic to medications.   SOCIAL HISTORY:  Negative.   FAMILY HISTORY:  Unremarkable.   REVIEW OF SYSTEMS:  Difficulty swallowing, urinary incontinence, and  thyroid disease.   PHYSICAL EXAMINATION:  HEAD, EARS, NOSE AND THROAT:  Normal.  NECK:  He has a decreased flexibility.  LUNGS:  Clear.  HEART:  Sounds normal.  ABDOMEN:  Normal.  EXTREMITIES:  Normal pulse.  He has a decreased flexibility of lumbar  spine.  NEUROLOGIC:  Normal.  He has no weakness.   The x-rays showed that he has a large osteophyte all the way from C2  down to C7-T1 with displacement of the esophagus and trachea.   CLINICAL IMPRESSION:  Dysphagia secondary to osteophyte, cervical from  C2-C7.   RECOMMENDATIONS:  The patient is being admitted for surgery.  The  procedure would be to remove the osteophyte and to allow him to swallow  barium.  He knows the risks such as infection, perforation of the  esophagus, no improvement whatsoever, and need for surgery.     ______________________________  Hilda Lias, M.D.     EB/MEDQ  D:  02/24/2009  T:  02/25/2009  Job:  [id number]

## 2010-12-25 NOTE — Letter (Signed)
March 25, 2007     Re:  CAYDIN, YEATTS                DOB:  [birth year]   Dear Rosanne Ashing:   I saw Mr. Voiles back in the office today.  He is scheduled for a  physical exam with you next week.  I got a chest x-ray today that showed  complete resolution of the right lower lobe empyema.  There is just a  small scar in that area.  His blood pressure is 152/82, pulse 64,  respirations 18.  Saturations were 98%.  He still has a raspy voice and  I suggested he discuss this with you.  I told him to continue on the  Mucinex and the Tessalon Perles for his cough as well as his secretions  and that you would decide when to stop them.   I will see him back again if he has any future problems.   ADDRESSEE:  Theressa Millard, M.D.  [address]  [place], [place]  [address]   Ines Bloomer, M.D.  Electronically Signed   DPB/MEDQ  D:  03/25/2007  T:  03/26/2007  Job:  [id number]

## 2010-12-25 NOTE — Op Note (Signed)
NAMEGARET, HOOTON                ACCOUNT NO.:  [id number]   MEDICAL RECORD NO.:  [id number]          PATIENT TYPE:  INP   LOCATION:  3025                         FACILITY:  [facility]   PHYSICIAN:  Hilda Lias, M.D.   DATE OF BIRTH:  07/08/1936   DATE OF PROCEDURE:  02/24/2009  DATE OF DISCHARGE:                               OPERATIVE REPORT   PREOPERATIVE DIAGNOSES:  Osteophyte at, C2, C7, and T1 and dysphagia.   POSTOPERATIVE DIAGNOSES:  Osteophyte at, C2, C7, and T1 and dysphagia.   PROCEDURE:  Removal of C2-T1 osteophyte.   SURGEON:  Hilda Lias, MD   CLINICAL HISTORY:  Mr. Mabey is a gentleman who has chronic  osteoarthritis.  For several years, he has been having difficulty  swallowing which is getting worse progressively.  He has a fusion by me  several years ago.  He has barium swallow which showed that there was  quite obvious distortion of the barium mostly in the upper esophagus.  The x-rays showed that he had a large osteophyte displacing the  esophagus and the trachea from C2-T1.  Surgery was advised.   PROCEDURE:  The patient was taken to the OR and after intubation, the  left side of the neck was cleaned with DuraPrep.  The patient had  somewhat stiffness up to the point that his head was completely  straight.  We had to use some supports to put the head to rest in the  bed.  Then, a longitudinal incision was made and retraction was  achieved.  We were straight to the cervical area.  Indeed, there was a  large osteophyte about 2-3 cm in front of the cervical spine.  It came  all the way down from C2 down to thoracic 1.  Using the rongeur as well  as the drill, we were able to remove the osteophyte to smooth the  cervical spine to his normal anatomy.  At the end, we had good  decompression.  There was plenty of space for the trachea and esophagus.  X-rays showed that the removal of osteophyte was achieved.  Then, we  used a mix of Gelfoam as bone wax to achieve  hemostasis in the bone.  Nevertheless, although we achieved good hemostasis, a drain was left in  the precervical area and the wound was closed with Vicryl and Steri-  Strips.           ______________________________  Hilda Lias, M.D.     EB/MEDQ  D:  02/24/2009  T:  02/25/2009  Job:  [id number]

## 2010-12-25 NOTE — Letter (Signed)
April 26, 2009   Theressa Millard, MD  [address]  [place], [place] [address]   Re:  [name], [name]                DOB:  [birth year]   Dear Rosanne Ashing,   I saw the patient back in the office today is complaining of chronic  hoarseness and as well as a productive cough.  We had previously treated  him with Jerilynn Som, and I told him to continue that.  A chest x-  ray today showed no evidence of recurrence of any type of empyema or  infection.  He recently had surgery of his neck by Dr. Jeral Fruit.  His  lungs were clear to auscultation and percussion.  His blood pressure is  116/68, pulse 64, respirations 18, sats were 97%.  My only suggestion if  it continues to bother him, he might be beneficial to see Dr. Sherene Sires,  again to be worked up for an allergic bronchitis or something of that  nature.  I will be happy to see him again, if he has any future thoracic  surgical problems.   Ines Bloomer, M.D.  Electronically Signed   DPB/MEDQ  D:  04/26/2009  T:  04/27/2009  Job:  [id number]

## 2010-12-25 NOTE — Letter (Signed)
January 28, 2007   Theressa Millard, M.D.  [address] [facility] [place] [address]   Re:  KEN, BONN                DOB:  [birth year]   Dear Rosanne Ashing:   I saw Mr. Aschoff back in the office today.  He still has a cough which he  says is better with Mucinex.  I told him to continue his Mucinex.   His lungs were clear to auscultation and percussion. Heart:  Regular  sinus rhythm. His blood pressure was still up at 161/87, pulse 52.  Respirations 18, sats were 95%.   I gave him a refill for Lopressor 25 mg twice a day.  I plan to see him  back again in a couple of months with a chest x-ray.   Ines Bloomer, M.D.  Electronically Signed   DPB/MEDQ  D:  01/28/2007  T:  01/28/2007  Job:  [id number]

## 2010-12-28 NOTE — Discharge Summary (Signed)
NAMEWAQAS, BRUHL                ACCOUNT NO.:  [id number]   MEDICAL RECORD NO.:  [id number]          PATIENT TYPE:  INP   LOCATION:  2028                         FACILITY:  [facility]   PHYSICIAN:  Rowe Clack, P.A.-C. DATE OF BIRTH:  [birth year]   DATE OF ADMISSION:  09/27/2006  DATE OF DISCHARGE:  10/06/2006                               DISCHARGE SUMMARY   HISTORY OF PRESENT ILLNESS:  The patient is a 75 year old black male  with a past medical history significant for pneumonia diagnosed in  January 2008 treated as an outpatient, loculated right pleural effusion  per CT scan of the chest on August 22, 2006, hypertension,  hypothyroidism and anxiety who presented with the above complaint of  cough and weakness with confusion.  The patient states that he went to  [facility] on September 04, 2006, and was diagnosed with  pneumonia and following an IM injection of antibiotics he was prescribed  Avalox for which he took for 6 days.  He subsequently followed up with  Dr. Sherene Sires on September 18, 2006, and per Dr. Thurston Hole E-chart note he  deferred further management of the right middle lobe process to Dr.  Edwyna Shell.  He also indicated that the patient was complaining of a  nonproductive cough and he discontinued his ACE inhibitor and put him on  an ARB.  He states that since September 18, 2006, his symptoms have  progressively worsened.  His cough is productive of yellowish phlegm.  His weakness has worsened as well.  The patient's wife also states that  the patient has been incoherent and appeared to have developed some  swelling in his ankles.  He was admitted through the ER to the [facility] for further evaluation and treatment.   PAST MEDICAL HISTORY:  As stated above.   MEDICATIONS:  1. Gabapentin 600 mg q.6 hours.  2. Synthroid 50 mcg daily.  3. Ativan 1 mg t.i.d.  4. Diazide 5 mg daily.  5. Ibuprofen 800 mg q.6 hours.  6. Micardis 80/25 mg once daily.   ALLERGIES:  No known drug allergies.   OCCUPATION:  He quit tobacco in 1983.  He denies alcohol.   FAMILY HISTORY:  His mother had colon and breast cancer.  His father had  diabetes.   REVIEW OF SYSTEMS:  Please see the history and physical.   PHYSICAL EXAMINATION:  Please see the history and physical.   PLAN:  Patient was admitted with right lower lobe pneumonia with  loculated right pleural effusion.  Cultures were obtained.  He was  placed on empiric antibiotics.  He was referred to Dr. Edwyna Shell and  scheduled for an appointment Wednesday February 20th, but since he was  hospitalized a consult was obtained with Dr. Donata Clay.  Dr. Donata Clay  saw the patient and he was felt to have a progressive right lower lobe  pneumonia with empyema requiring decortication.  The case was discussed  with Dr. Edwyna Shell who subsequently scheduled the case procedure.  On  September 29, 2006, the patient was taken to the operating room where  he  underwent the following procedures:  Right thoracotomy with drainage of  empyema and decortication.  He tolerated this procedure well and was  taken to the Postanesthesia Care Unit in stable condition.   POSTOPERATIVE COURSE:  The patient has showed a stable improvement over  time.  All routine lines and monitors and drainage devices have been  discontinued in the standard fashion.  Cultures have grown  microaerophilic strep.  His final antibiotic is Rocephin which he will  continue as an outpatient for 2 additional weeks.  He was seen in the  hospital on October 06, 2006, and deemed to be acceptable for discharge  at that time by Dr. Edwyna Shell.   MEDICATIONS ON DISCHARGE:  1. Tylox 1-2 every 6 hours as needed.  2. Synthroid 50 mcg daily.  3. Gabapentin 600 mg 4 times daily.  4. Micardis/hydrochlorothiazide 80/25 one-half tablet daily.  5. Lopressor 25 mg twice daily.  6. Rocephin 1 g q.24 hours x14 days per the home nurse.   INSTRUCTIONS:  The patient  received written instructions in regard to  medications, activity, diet, wound care and followup.   FOLLOWUP:  Will include Dr. Edwyna Shell in the office in one week with a  chest x-ray.   FINAL DIAGNOSIS:  Right empyema.   OTHER DIAGNOSES:  As previously per the history.   LABS:  Most recent labs showed the electrolytes on October 05, 2006, to  be within normal limits.  White blood cell count on October 05, 2006,  was 9.4.  Hemoglobin and hematocrit 10 and 30 respectively.      Rowe Clack, P.A.-C.     Sherryll Burger  D:  10/06/2006  T:  10/06/2006  Job:  [id number]   cc:   Charlaine Dalton. Sherene Sires, MD, [facility]  Theressa Millard, M.D.

## 2010-12-28 NOTE — Assessment & Plan Note (Signed)
[facility]                             PULMONARY OFFICE NOTE   [name], [name]                       MRN:          [id number]  DATE:09/04/2006                            DOB:          [birth year]    HISTORY OF PRESENT ILLNESS:  This is a 75 year old African-American male  patient of Dr. Thurston Hole that was recently seen for pulmonary consultation  on July 21, 2006 for hemoptysis and abnormal chest x-ray.  Patient  was found to have a right cavitary nodule in the right lung with ongoing  hemoptysis.  The patient subsequently underwent a CT scan and showed a  new mass measuring 2.4 x 3 cm that appeared somewhat necrotic with no  associated adenopathy.  Patient was sent over to Dr. Edwyna Shell.  Dr.  Scheryl Darter notes are not available on today's visit; however, the patient  and his wife report that he has recently seen Dr. Edwyna Shell on December 26  and August 14, 2006.  He underwent a PET scan and an MRI.  PET scan on  August 08, 2006 showed no evidence for hypermetabolic uptake within  the medial right lower lobe pulmonary lesion and was felt that this was  a nonneoplastic process.  A MRI of the brain on August 11, 2006 was no  evidence of intracranial metastatic disease and no acute infarct.  The  patient presents today for an acute office visit complaining over the  last two days, he has developed a productive cough with thick yellow  sputum and weakness.  Patient denies any hemoptysis, chest pain,  orthopnea, PND, or leg swelling.  Patient's wife does report that  patient seems somewhat confused today on two separate occasions after  waking up.  The patient was given some Tussionex, which he has been  taking over the last week for a recurrent dry cough that has been going  on for the last two months.  The patient also has a chronic pain  syndrome and is seen at the pain clinic and is on several pain  medications as well.   PAST MEDICAL HISTORY:   Reviewed.   CURRENT MEDICATIONS:  Reviewed.   PHYSICAL EXAMINATION:  GENERAL:  Patient is an elderly male in no acute  distress.  VITAL SIGNS:  Temperature 99.1, blood pressure 142/82, pulse recheck is  89.  O2 saturation is 92% on room air.  HEENT:  Unremarkable.  Oral mucosa is pink and moist.  NECK:  Supple without adenopathy.  No JVD.  LUNGS:  The lung sounds reveal some coarse rhonchi bilaterally,  increased on the right.  CARDIAC:  Regular rate without murmur, rub or gallop.  ABDOMEN:  Soft and nontender.  No hepatosplenomegaly.  No guarding or  rebound noted.  EXTREMITIES:  Warm without any calf tenderness, cyanosis, clubbing, or  edema.  NEURO:  Patient is alert and oriented x3.  No focal deficits are  detected.   DATA:  Chest x-ray, there is no comparison films at today's visit.  X-  ray does appear to have a right mass in the right middle lower lobe.  There is some patchy opacities on the left lower lobe and some slight  increased markings on the right middle to upper lobe.   IMPRESSION:  Left-sided community-acquired pneumonia.  In-depth  conversation with patient and his wife.  Patient does wish to go home  and not be hospitalized at this time.  Patient has been recommended to  receive Rocephin 1 gm injection, begin Avelox 400 mg daily x7 days, add  in Mucinex DM twice daily.  Discontinue Tussionex, which could be  contributing to his confusion along with his other pain medications.  Will undergo a CBC with diff and a CMET today.  Patient is advised if  symptoms do not improve or worsen, patient will need hospitalization  tomorrow for IV antibiotics.  As long as symptoms improve, patient will  return here in a one-week followup with Dr. Sherene Sires or sooner if needed.  Would follow up chest x-ray.   Patient's case and x-ray were discussed with Dr. Maple Hudson with the agreed  plan of treatment as above.      Rubye Oaks, NP  Electronically Signed      Charlaine Dalton.  Sherene Sires, MD, [facility]  Electronically Signed   TP/MedQ  DD: 09/05/2006  DT: 09/05/2006  Job #: [id number]

## 2010-12-28 NOTE — Op Note (Signed)
NAMEMYNOR, WITKOP                ACCOUNT NO.:  [id number]   MEDICAL RECORD NO.:  [id number]          PATIENT TYPE:  OIB   LOCATION:  5015                         FACILITY:  [facility]   PHYSICIAN:  [name], M.D. DATE OF BIRTH:  November 03, 1935   DATE OF PROCEDURE:  07/11/2005  DATE OF DISCHARGE:                                 OPERATIVE REPORT   PREOPERATIVE DIAGNOSIS:  Chronic left acromioclavicular degenerative  arthritis with stage III impingement and delaminated degenerative rotator  cuff tear involving supraspinatus and infraspinatus tendons retracted  approximately 3-4 cm.   POSTOPERATIVE DIAGNOSIS:  Chronic left acromioclavicular degenerative  arthritis with stage III impingement and delaminated degenerative rotator  cuff tear involving supraspinatus and infraspinatus tendons retracted  approximately 3-4 cm.   OPERATION:  1.  Diagnostic arthroscopy, left shoulder.  2.  Open subacromial decompression with bursectomy, coracoacromial ligament      release and extensive acromioplasty with removal of osteophytes along      the articular surface of acromioclavicular joint.  3.  Open distal clavicle resection.  4.  Reconstruction of chronic delaminated retracted supraspinatus and      infraspinatus rotator cuff tear.   OPERATING SURGEON:  [name] Fitch. Sypher, MD   ASSISTANT:  [name] Rusk PA-C.   ANESTHESIA:  General endotracheal supplemented by interscalene block, left  upper extremity.   SUPERVISING ANESTHESIOLOGIST:  [name] Ben, MD   INDICATIONS:  [name] is a 75 year old gentleman referred through  the courtesy of Dr. Thyra [name] for evaluation and management of a painful  left shoulder.  [name] had been treating [name] for chronic spinal  pain.  During the course of his treatment, he noted left shoulder pain.  Dr.  Vear [name] of ordered an MRI which documented a retracted rotator cuff tear on  the left.   [name] sought an upper extremity  orthopedic consult at [facility].   He was evaluated in the office during a detailed consultation in September  2006.  At that time, we reviewed his clinical examination, plain films and  MRI documenting a very substantial retracted delaminated rotator cuff tear.   We recommended proceeding with reconstruction.   Preoperatively, he was seen for an anesthesia consultation by Dr. Jairo [name] due to a history of very difficult intubation.  [name] has a  history of extensive cervical spine degenerative disk disease and esophageal  motility problems.   After a thorough review of the medical records, it was decided that he  should be cared for at [facility] proper rather the [facility], given his complex multiple medical problems and his history of  difficult intubation.   In addition, we obtained a screening consultation from his primary care  physician, Dr. Benjaman [name], during which he advised that [name] was a  proper candidate for general anesthesia.   After informed consent, [name] is brought to the operating room at this  time.   PROCEDURE:  [name] was brought to the operating room and placed in  supine position on the operating  table.   Following a placement of a interscalene block on the left by Dr. Jean [name] in  the holding area, anesthesia was complete in the left forequarter.   [name] was transported to room 4 and placed in supine position upon the  operating table.   General endotracheal anesthesia was induced with the aid of the video  endoscope under the direct supervision of Dr. Jean [name].   [name] was then carefully positioned in the beach-chair position with  padded torso and headholder designed for shoulder arthroscopy.  The entire  left upper extremity and forequarter were prepped with DuraPrep followed by  draping with impervious arthroscopy drapes.   The shoulder was distended with 20 mL of sterile  saline with a spinal needle  brought in anteriorly, followed by introduction of the arthroscope through a  standard posterior portal.   Diagnostic arthroscopy revealed intact hyaline and articular cartilage  surfaces on the glenoid and humeral head.  The labrum was in good condition.  The biceps origin was satisfactory.  The biceps tendon was normal through  the rotator interval.   A significantly retracted delaminated degenerative tear of the supraspinatus  and infraspinatus tendons was identified and documented with a digital  camera.   The scope was then removed from the glenohumeral joint and placed in the  subacromial space.   There was significant bursitis and a very prominent AC joint noted.   At this point, we elected to proceed directly to reconstruction of the  rotator cuff through open technique.   A 6-cm incision was fashioned from the distal clavicle across the anterior  acromion.  The anterior third of the deltoid was elevated off the distal  clavicle and the acromion followed by release of the capsule of AC joint.   The distal 18 mm of clavicle were removed with oscillating saw and the  oscillating saw was used to contour the acromion to a type 1 morphology with  removal of a large anterior osteophyte and removal of an osteophyte along  the medial portion of the acromion forming the articular surface of the AC  joint.   A hand rasp was used to bevel the edge anteriorly and laterally.   The delaminated rotator cuff tear was inspected.   The tear was thoroughly debrided of all necrotic-appearing tendon and  resected to a triangular defect.   A pair of #2 FiberWire sutures were used in a grasping technique to gather  the tendon and to close 80% of the defect.   The tendon was then advanced laterally to decorticated greater tuberosity  that was lowered in profile with the use of a power bur approximately 4 mm. The width of the decorticated areas was more than 4  cm.   Two bio-corkscrew anchors were placed along the medial foot print.  One was  used to secure the central portion of the infraspinatus and the second used  to secure the central portion of the supraspinatus.   The grasping sutures were then placed with Merlinda Frederick through-bone technique  to advance the rotator cuff laterally and inset in satisfactory manner.  A  final finishing suture was used to smooth the repair.   The anterior third of the deltoid was then repaired to the acromion with  periosteal sutures and through [facility] joint capsule sutures.  The dead space  created by the distal clavicle resection was closed with trapezius and  deltoid muscle gathered with mattress sutures of #2 FiberWire.   The deltoid split laterally  was repaired with mattress suture of 0 Vicryl.   The wound was then repaired with subdermal sutures of 2-0 Vicryl and  intradermal 3-0 Prolene.   There were no apparent complications.  [name] tolerated surgery and  anesthesia well.  He was transferred to the recovery room with stable signs.   He will be discharged to the care of either his family, if he is stable in  the postoperative period, or will be admitted to 5000 orthopedic floor for  24-hour observation with appropriate analgesics in the form of IV PCA  morphine and  p.o. and IV Dilaudid.   We anticipate Ancef 1 g IV q.8 h. as prophylactic antibiotic if he is  admitted and if he goes home, we will provide oral Keflex x4 days.      [name] Fitch [name], M.D.  Electronically Signed     RVS/MEDQ  D:  07/11/2005  T:  07/12/2005  Job:  [id number]   cc:   Theressa Millard, M.D.  Fax: [phone]   Mark L. [name], M.D.  Fax: [phone]

## 2010-12-28 NOTE — Assessment & Plan Note (Signed)
[facility]                             PULMONARY OFFICE NOTE   [name], [name]                       MRN:          [id number]  DATE:09/18/2006                            DOB:          April 13, 1936    This is a pulmonary extended followup office visit.   HISTORY:  [age]-year-old black male seen on January 24 with acute  worsening dyspnea, weakness and right-sided chest discomfort associated  with dyspnea with minimal activity and marked elevation of white count,  when he was seen by the nurse practitioner on January 24 and given a 7-  day course of Avelox.  He says he is almost back to normal except for  feeling a little bit weak.  He denies any pleuritic pain, fevers,  chills, sweats, orthopnea, PND or leg swelling.   MEDICATIONS:  His medications were reviewed in detail on the face sheet  column dated September 18, 2006.  Note that he is on ACE inhibitors and  cleared his throat frequently during the interview and exam.  However,  he was not able to produce any mucus at all.   PHYSICAL EXAMINATION:  His blood pressure is 140/86.  HEENT:  He had no evidence of excessive postnasal drainage or  cobblestoning.  He was quite hoarse, however.  NECK:  Supple without cervical adenopathy or tenderness.  The trachea  was midline without thyromegaly.  LUNGS:  Lung fields revealed decreased breath sounds at the right base,  overall air movement was adequate with no wheezing on inspiratory or  expiratory maneuvers.  CARDIAC:  There is a regular rhythm without murmur, gallop or rub.  ABDOMEN:  Soft, benign.  EXTREMITIES:  Warm without calf tenderness, cyanosis, clubbing or edema.   Chest x-ray continues to show density in the right middle lobe and lower  lobe, for which I was concerned a tumor might be present and for which  Dr. Edwyna Shell has already arranged for a followup.   IMPRESSION:  His acute deterioration is probably related to a pneumonia  associated with leukocytosis, which was treated with Avelox  appropriately.  I continue to be concerned that there is a malignancy  involving the right base, but understand Dr. Edwyna Shell has appropriate  followup already scheduled.   His fatigue may be related to the underlying problem above, but since it  was acutely related to what appeared to be infection, I am going to  repeat a CBC today (since he had such marked leukocytosis), a BMET  (because he had a slight elevation in BUN and creatinine) and a sed  rate.   In terms of the throat clearing that he did here in the office, it was  more irritating to his wife than it is to him, but probably represents  an ACE-inhibitor effect and I recommended a trial of Micardis 80/25 one-  half daily in the place of lisinopril until he sees Dr. [name], his  primary physician of record, in the office within the next 6 weeks (6  weeks of samples given).   RECOMMENDATION:  I will defer the management of  his right middle lobe  and right lower lobe process to Dr. Edwyna Shell, and see the patient back  here on a p.r.n. basis.   Add:  ESR is over 100 indicating an active process in the right base  either empyema, tumor, or less likely, BOOP - I ordered a CT of the  Chest and suggested we move the followup by [place] to the day after the  scan is done if possible.     Charlaine Dalton. Sherene Sires, MD, [facility]  Electronically Signed    MBW/MedQ  DD: 09/18/2006  DT: 09/19/2006  Job #: [id number]   cc:   Ines Bloomer, M.D.  Theressa Millard, M.D.

## 2010-12-28 NOTE — Op Note (Signed)
[place]  Patient:    MINGO, SIEGERT                       MRN: [id number] Proc. Date: 04/15/00 Adm. Date:  [id number] Attending:  Danella Penton                           Operative Report  PREOPERATIVE DIAGNOSIS:  Lumbar stenosis, L3-4, 4-5 with a chronic S1 radiculopathy secondary to hypertrophied facet.  POSTOPERATIVE DIAGNOSIS:  Lumbar stenosis, L3-4, 4-5 with a chronic S1 radiculopathy secondary to hypertrophied facet.  OPERATION PERFORMED:  Bilateral L3-4-5 laminectomy, foraminotomy with decompression of the nerve root as well as the dural sac.  Midas Rex.  SURGEON:  Tanya Nones. Jeral Fruit, M.D.  ASSISTANT:  Hewitt Shorts, M.D.  ANESTHESIA:  INDICATIONS FOR PROCEDURE:  Mr. Eddinger is a 75 year old gentleman obese who had been followed by me since 1997 with a chronic S1 radiculopathy.  Lately he is having difficulty walking with pain down to both legs.  X-ray showed that he has a block at the level of 3-4, 4-5 as well as hypertrophy of the facet. Surgery was advised.  The patient knew of the risks such as infection, cerebrospinal fluid leak, worsening of the pain, need for further surgery, weakness.  DESCRIPTION OF PROCEDURE:  The patient was taken to the operating room and it was quite difficult to intubate him.  It took at least 45 minutes to reintubate.  Finally he was positioned in a prone manner and the back was prepped with Betadine.  A midline incision from L3 to S1 was made.  Muscle was retracted laterally.  Then with the Stille rongeur, we removed the spinous process of 3, 4, and 5.  With the Midas Rex drill, we did bilateral laminectomy, removing the lamina as well as thick yellow ligament. Decompression was achieved and then with the foraminotomy to decompress the L3, L4 and L5 as well as the S1 nerve root.  Medial facetectomy at 5-1 on the right side was done.  At the end we had plenty of room for the dural sac  as well as the nerve root.  X-rays showed that indeed we were in the right area. From then on hemostasis was done with bipolar.  Fentanyl and Depo-Medrol were left in the pleural space and the wound was closed with Vicryl and Steri-Strips.  The patient did well. DD:  04/15/00 TD:  04/15/00 Job: [id number]

## 2010-12-28 NOTE — Consult Note (Signed)
[name], [name] NO.:  [id number]   MEDICAL RECORD NO.:  [id number]          PATIENT TYPE:  INP   LOCATION:  1405                         FACILITY:  [facility]   PHYSICIAN:  Kerin Perna, M.D.  DATE OF BIRTH:  Feb 24, 1936   DATE OF CONSULTATION:  09/27/2006  DATE OF DISCHARGE:                                 CONSULTATION   PRIMARY CARE PHYSICIAN:  Theressa Millard, M.D., [facility] at [facility]  Internal Medicine.   THORACIC SURGEON:  Ines Bloomer, M.D., [facility].   CONSULTANT:  Kerin Perna, M.D., [facility].   REASON FOR CONSULTATION:  Right lower lobe pneumonia and empyema.   CHIEF COMPLAINT:  Weakness, confusion, and edema.   HISTORY OF PRESENT ILLNESS:  I was asked to evaluate this 75 year old  black male, ex-smoker, for thoracic surgical intervention for recently  diagnosed right lower lobe and empyema by CT scan performed recently.  The patient has been followed by the pulmonary service since the Fall  for initially a density in the right lower lobe of the lung which was  evaluated by PET scan and found to be negative for hypermetabolic  activity which progressed to right lower lobe infiltrate, consolidation,  and now loculated effusion consistent with empyema.  He has had multiple  courses of oral antibiotics which have temporarily improved his  symptoms.  He was scheduled to see Dr. Edwyna Shell in the office in the next  four days but his wife brought him to the emergency department at [facility] due to progressive weakness, confusion, and inability to  care for the patient safely at home.  He denies pleural chest pain at  this point, although he has had some in the past.  He denies recent  hemoptysis, but he has had some hemoptysis in the past 2-3 months.  He  is currently on the hospital service on IV antibiotics (azithromycin and  Rocephin).  And, on admission he was also found to be  hypokalemic with a  potassium of 2.6 and was cultured before he was placed on antibiotic.  Because of his new diagnosis of empyema, a thoracic surgical evaluation  was requested.   PAST MEDICAL HISTORY:  1. Hypertension.  2. Degenerative arthritis of the spine, requiring laminectomy and      previous shoulder, rotator cuff surgery.  3. NO KNOWN DRUG ALLERGIES.  4. Adenocarcinoma of the prostate, Gleason grade 7, followed by Dr.      Patsi Sears with normal PSAs recently.  5. Liver hemangioma by CT scan.  6. Hypothyroid on medication.  7. Anxiety disorder.  8. Recent malnutrition and edema due to probable hypoalbuminemia.   MEDICATIONS:  He is on:  1. Protonix 40 mg daily.  2. Synthroid 0.05 mg daily.  3. Gabapentin 600 mg t.i.d.  4. Ativan 1 mg t.i.d. p.r.n.  5. Micardis 80/25 mg daily.  6. Tylenol p.r.n. fever/pain.   SOCIAL HISTORY:  The patient is retired and married.  He worked in [employer].  He stopped smoking years ago (1983).  He does  not use  alcohol.   FAMILY HISTORY:  Negative for a history of lung cancer.  Positive for a  history of colon and breast cancer.   REVIEW OF SYSTEMS:  He has had weight loss of 10-15 pounds over the past  few months.  He denies any active dental problems, and he sees a dentist  regularly.  There was no history of aspiration or difficulty swallowing.  His wife states he has had difficulty with intubation for his elective  operations on his spine and shoulder.  There is no history of cardiac  disease.  A CT scan shows no significant pericardial effusion.  There is  no history of hepatitis, jaundice, or blood per rectum, or peptic ulcer  disease.  A CT scan performed this month did show a benign appearing  hemangioma of the liver.  Urologic review is positive for prostate  cancer but it appears to be under control with a normal PSA level  recently.  VASCULAR:  Negative for DVT, claudication, or TIA.  HEMATOLOGIC:  Positive for anemia  on presentation with a hematocrit of  32% but no bleeding disorder.  NEUROLOGIC:  Negative for stroke or  seizure but he has had altered mental status with some confusion.  An  MRI of the brain was performed when he was evaluated for possible lung  cancer and this showed no evidence of lesions other than some small  vessel disease consistent with age progression.   PHYSICAL EXAMINATION:  VITAL SIGNS:  The patient is 6 feet 2 inches,  weighs 228 pounds, blood pressure was 140/80, pulse 98 sinus tach,  oxygen saturation 85% on room air, now 93% on 4 liters, temperature is  100.3.  GENERAL APPEARANCE:  Is that of an elderly black male who appears  chronically ill and slightly confused.  HEENT:  Normocephalic.  Dentition is excellent.  Pharynx clear.  NECK:  Without JVD, mass, or carotid bruit.  There are no palpable  supraclavicular or cervical lymph nodes.  LUNGS:  Breath sounds are diminished at the right base, clear on the  left.  CARDIAC:  With a regular rhythm without S3, gallop, or murmur, or  friction rub.  ABDOMEN:  Soft without organomegaly or tenderness or pulsatile mass.  EXTREMITIES:  Reveal no clubbing, cyanosis, or tenderness.  There is 2+  ankle edema.  Peripheral pulses are 2+ in the upper extremities, 1+ in  the lower extremities.  NEUROLOGIC:  Nonfocal.   LABORATORY DATA:  I reviewed his recent CT scan of the chest which shows  right lower lobe consolidation with loculated empyemas both posterior  and anterior.  He is hypokalemic with a potassium on presentation of 2.6  which has been supplemented.  His white count is normal at 8,000.  His  hematocrit is low at 32%.  And, his glucose is normal at 120.   IMPRESSION AND PLAN:  The patient will need right VATS-decortication and  possible bronchoscopy.  Antibiotics have been started.  We will prepare  the patient for a decortication in the next few days to be scheduled by Dr. Edwyna Shell who will see the patient in the  morning.   Thank you for the consultation.      Kerin Perna, M.D.  Electronically Signed     PV/MEDQ  D:  09/27/2006  T:  09/27/2006  Job:  [id number]   cc:   Theressa Millard, M.D.  Fax: [phone]   [facility] Office

## 2010-12-28 NOTE — Discharge Summary (Signed)
[place]  Patient:    [name], [name]                       MRN: [id number] Adm. Date:  [id number] Disc. Date: 04/17/00 Attending:  Danella Penton                           Discharge Summary  ADMISSION DIAGNOSIS:  Lumbar stenosis with hypertrophy of the facet at right L5-S1.  FINAL DIAGNOSIS:  Lumbar stenosis with hypertrophy of the facet at right L5-S1.  CLINICAL HISTORY:  Mr. Mesta was admitted because of neurogenic claudication. I have seen him before because of right S1 radiculopathy.  Surgery was advised after the findings in the myelogram.  LABORATORY:  Normal.  HOSPITAL COURSE:  The patient was taken to surgery on April 15, 2000, and extensive bilateral 3, 4, 5 laminectomy, facetectomy in the right 5-1 partially was done.  The patient today is doing really well and to my surprise, he is ready to go home today.  CONDITION ON DISCHARGE: Improved.  MEDICATIONS:  Percocet and Diazepam.  DIET:  He was encouraged to lose weight.  ACTIVITY: He is not to drive for at least a week.  FOLLOW-UP:  He is to be seen by me in three weeks. DD:  04/17/00 TD:  04/17/00 Job: [id number]

## 2010-12-28 NOTE — H&P (Signed)
[place]. [facility]  Patient:    [name], [name]                       MRN: [id number] Adm. Date:  [id number] Attending:  Danella Penton                         History and Physical  HISTORY OF PRESENT ILLNESS:  Mr. Persing is a gentleman who had been seen by me in my office for many years.  The first time I saw him was almost four years ago, when he came complaining of chronic right leg pain, and it was found that he has a hypertrophy of the facet at the level of 5-1.  The patient had _____ but now he had been complaining of back pain with radiation down to both legs, with claudication.  He feels that the pain in the right leg is worse than the left one.  Associated with this, he complains of numbness in the hands, and it was found that he has a bilateral carpal tunnel syndrome.  Because of persistent pain, the patient had a lumbar myelogram, which shows a severe case of stenosis at the level of 3-4, 4-5, and 5-1.  Because of the findings, the patient is being admitted today for lumbar decompression.  PAST MEDICAL HISTORY:  Bilateral carpal tunnel syndrome.  He has had appendectomy.  ALLERGIES:  He is not allergic to any medications.  SOCIAL HISTORY:  Does not drink, does not smoke.  MEDICATIONS:  The patient is taking _____ medication and medication for allergies.  FAMILY HISTORY:  Unremarkable.  REVIEW OF SYSTEMS:  Positive for back and leg pain and also bilateral carpal tunnel syndrome proved by nerve conduction velocities.  PHYSICAL EXAMINATION:  HEENT:  Normal.  NECK:  Normal.  LUNGS:  Clear.  HEART:  Heart sounds normal.  ABDOMEN:  Normal.  EXTREMITIES:  Normal pulses.  NEUROLOGIC:  Mental status was normal.  Cranial nerves normal.  Strength 5/5 except that I can break dorsally flexed on both feet as well as the quadriceps.  Reflexes 2+, no Lhermittes, straight leg raising is positive bilaterally about 60 degrees.  He has a  decrease of flexibility of the lumbar spine.  He has a Tinels sign bilaterally in both hands.  DIAGNOSTIC EVALUATION:  The myelogram shows that he has a severe case of lumbar stenosis at the level of 3-4 with complete block at the level of 4-5. Also, he has a hypertrophy of the fact at the level of 5-1 on the right side.  CLINICAL IMPRESSION: 1. Lumbar stenosis with hypertrophy of the fact at 5-1 right. 2. Carpal tunnel syndrome.  RECOMMENDATIONS:  The patient is being admitted for decompression at the level of 3, 4, 5, and medial facetectomy.  He knows about the risks, such as infection, CSF leak, worsening pain, paralysis, need for further surgery, which might require fusion. DD:  04/15/00 TD:  04/15/00 Job: [id number]

## 2010-12-28 NOTE — Assessment & Plan Note (Signed)
[facility]                             PULMONARY OFFICE NOTE   TREVYON, SWOR                       MRN:          [id number]  DATE:07/21/2006                            DOB:          1935/10/13    PULMONARY CONSULTATION:   REASON FOR CONSULTATION:  Hemoptysis with abnormal chest x-ray.   HISTORY:  This is a 75 year old black male remote smoker with new-onset  low-grade hemoptysis 2 months ago associated with vague right-sided  chest discomfort that is improved after treatment with antibiotics but  for which he has had follow-up chest x-ray suggesting a persistent  cavitary nodule in the right lung.  He has had minimal ongoing  hemoptysis over the last several weeks with no history of an abrupt  illness to suggest pneumonia such as acute fever, purulent sputum,  chills, etc.  He denies any unintended weight loss or recent dental work  or unusual exposure history.   PAST MEDICAL HISTORY:  1. Hypertension.  2. Remote back and shoulder surgery.  3. The patient states that he had traces of cancer cells at the time      of a prostate surgery but has had seen no evidence of cancer on      multiple visits since then over the last several years.   ALLERGIES:  None known.   MEDICATIONS:  Centrum, Synthroid, Senokot, Avodart, iron, lisinopril,  gabapentin.   SOCIAL HISTORY:  He quit smoking in 1983.  He is retired with no unusual  travel, pet or hobby exposure.   FAMILY HISTORY:  Reported in detail and significant for the absence of  respiratory diseases or lung cancer.  His mother had colon and breast  cancer.   REVIEW OF SYSTEMS:  Taken in detail on the worksheet and significant for  the problems as outlined above.   PHYSICAL EXAMINATION:  GENERAL:  This is a pleasant, well-preserved,  ambulatory white male who appears his stated age.  VITAL SIGNS:  He is afebrile with stable vital signs.  HEENT:  Oropharynx is clear.  Dentition is  intact.  Nasal turbinates  normal.  Ear canals clear bilaterally.  NECK:  Supple without cervical adenopathy or tenderness.  Trachea is  midline.  No thyromegaly.  LUNGS:  Lung fields perfectly clear bilaterally to auscultation and  percussion.  CARDIAC:  There is a regular rhythm without murmur, gallop, or rub.  ABDOMEN:  Soft, benign.  No palpable organomegaly, mass or tenderness.  EXTREMITIES:  Warm without calf tenderness, cyanosis, clubbing or edema.   Hemoglobin saturation 98% on room air.   Chest CT scans were reviewed from July 16, 2006, back through  June 22, 2005.  He clearly has a new mass measuring 2.4 x 3.0 cm,  which appears necrotic with no associated adenopathy.  He also has a  stable right hepatic lobe lesion that has not changed since November  2006.   IMPRESSION:  New peripheral lung mass in a patient who has a remote  smoking history and did not have this lesion by CT scan through this  same area in November  2006, strongly indicates the likelihood of  bronchogenic carcinoma, probably a well-differentiated squamous cell  cancer, especially given the fact that the only symptom has been low-  grade hemoptysis with no antecedent history to suggest pneumonia,  pulmonary embolism, etc.   I therefore recommended proceeding directly with an excisional biopsy in  the form of lobectomy, which the patient would appear to easily tolerate  based on his residual lung function, which appears clinically quite  good.   I discussed the various options with the patient and explained why doing  a biopsy would not actually change the approach to the problem, and he  did appear to understand and accept the need to proceed with surgery.  I  have taken the liberty of scheduling for this purpose with Ines Bloomer, M.D., at his earliest available appointment, although I think it  would be reasonable to wait until after the holidays if Dr. Edwyna Shell  agrees.     Charlaine Dalton.  Sherene Sires, MD, [facility]  Electronically Signed    MBW/MedQ  DD: 07/21/2006  DT: 07/22/2006  Job #: [id number]   cc:   Theressa Millard, M.D.  Ines Bloomer, M.D.

## 2010-12-28 NOTE — Op Note (Signed)
NAME:  [name], [name]                          ACCOUNT NO.:  [id number]   MEDICAL RECORD NO.:  [id number]                   PATIENT TYPE:  INP   LOCATION:  3006                                 FACILITY:  [facility]   PHYSICIAN:  Hilda Lias, M.D.                DATE OF BIRTH:  May 20, 1936   DATE OF PROCEDURE:  07/26/2003  DATE OF DISCHARGE:                                 OPERATIVE REPORT   PREOPERATIVE DIAGNOSIS:  Degenerative disk disease multiple levels, worst at  the level 3-4 with chronic L3 radiculopathy, status post lumbar laminectomy.   POSTOPERATIVE DIAGNOSIS:  Degenerative disk disease multiple levels, worst  at the level 3-4 with chronic L3 radiculopathy, status post lumbar  laminectomy.   OPERATION PERFORMED:  L3-L4 decompression, lysis of adhesions and removal of  L3 facet, total L3-L4 diskectomy, interbody fusion with allograft and  autograft.  Posterolateral fusion.  Pedicle screw L3-L4. C-arm. Cell Saver.   SURGEON:  Hilda Lias, M.D.   ASSISTANT:  Hewitt Shorts, M.D.   ANESTHESIA:  General.   INDICATIONS FOR PROCEDURE:  The patient was admitted because of back pain  with radiation to muscles at both sides.  The patient has a history of  degenerative disk disease and had been followed here for almost five years.  The degenerative disk disease progressed.  The patient has had previous  lumbar laminectomy.  Now the x-ray showed worsening at the level of 3-4.  Surgery was advised at this level.  The patient had above and below  degenerative disk disease but the clinical one was 3-4.  The risks were  explained in the history and physical.   DESCRIPTION OF PROCEDURE:  The patient was taken to the operating room and a  midline incision from L2 down to L3-L4 made.  Muscle was retracted  laterally.  The most difficult part of the procedure was to localize the  dura mater.  The patient had quite a bit of adhesion.  We went laterally and  we removed the facet of  L3. From then on we worked our way from laterally to  midline.  We identified the L3-L4 nerve root.  Lysis was accomplished and  foraminotomy was done with plenty of room for both L3 and L4 nerve roots.  Having done this, we retracted medially and we entered the disk space.  The  disk space was quite narrow and we worked our way until we were able to  introduce an 11 mm  spacer.  From then on the end plate was curetted.  Then  with help of a C-arm we introduced two pieces of bone graft 10 x 26.  Medially and posterolaterally we used allograft and autograft.  Then we went  laterally. We identified the transverse processes of 3 and 4. Then with a C-  arm we found the pedicles 3 and 4.  Pedicle and screw were inserted followed  by a tap and then 6.5 x 45 pedicle screws.  This was done at the level 3-4  bilaterally.  Then we went laterally.  We removed the periosteum of 3-4.  Then with auto and allograft, we filled up the space.  Then a __________ was  used between L3 and L4.  Fibrin glue was used in the epidural space.  Having  done this, the area was irrigated.  Lateral C-spine as well as AP showed  good position.  From then on, the wound was closed with Vicryl and Steri-  Strips.                                              Hilda Lias, M.D.   EB/MEDQ  D:  07/26/2003  T:  07/26/2003  Job:  [id number]

## 2010-12-28 NOTE — Consult Note (Signed)
NAME:  [name], GLASSCOCK                          ACCOUNT NO.:  [id number]   MEDICAL RECORD NO.:  [id number]                   PATIENT TYPE:  INP   LOCATION:  3006                                 FACILITY:  [facility]   PHYSICIAN:  Sigmund I. Patsi Sears, M.D.         DATE OF BIRTH:  1935/09/29   DATE OF CONSULTATION:  07/29/2003  DATE OF DISCHARGE:                                   CONSULTATION   HISTORY OF PRESENT ILLNESS:  A 75 year old African-American male who  underwent L3-L4 decompression, total L3-L4 diskectomy, and fusion with  allograft and autograft with pedicle screw L3-L4.  The patient states Foley  catheter was removed yesterday, and he has had trouble voiding since then.  He feels a strong urge to urinate but is only able to dribble when he goes  to the bathroom.  He states this has never happened to him in the past, and  he has had no BPH problems.  He had a complete physical about six months ago  with Dr. Earl Gala and states that his prostate exam and PSA were  within  normal limits.   PAST MEDICAL HISTORY:  1. Appendectomy.  2. Decompression of the right L5-S1.  3. Surgery for carpal tunnel syndrome.   CURRENT MEDICATIONS:  1. Ferrous sulfate 325 mg p.o. b.i.d.  2. Loratadine 2 mg p.o. daily.  3. Neurontin 300 mg p.o. t.i.d.  4. Lisinopril 10 mg p.o. daily.  5. Hydrochlorothiazide 12.5 mg p.o. daily.  6. Levothyroxine 15 mcg p.o. daily except Saturday and Sunday with 75 mcg on     Saturday and Sunday.   ALLERGIES:  No known drug allergies.   REVIEW OF SYSTEMS:  Noncontributory other than as stated in the HPI.   PHYSICAL EXAMINATION:  Unremarkable. Bladder is not distended.  (The patient  states he was just in and out catheterized.)   IMPRESSION:  Bladder outlet obstruction postoperatively.   PLAN:  We will start the patient on Flomax.  We will give him a double dose  now, then 0.4 mg p.o. daily.  Observe the patient for dizziness due to  double dose Flomax, and we  will also continue to in and out catheterize him  p.r.n.  We will see how the patient does over the next 24 hours with  voiding.     Terri Piedra, N.P.                         Sigmund I. Patsi Sears, M.D.    HB/MEDQ  D:  07/29/2003  T:  07/30/2003  Job:  [id number]

## 2010-12-28 NOTE — Assessment & Plan Note (Signed)
[facility]                                 ON-CALL NOTE   [name], [name]                         MRN:          [id number]  DATE:09/27/2006                            DOB:          February 08, 1936    I received a call from [name] wife stating that [name] was  somewhat lethargic this morning and had worsening swelling in his legs.  He was also having worsening breathing. He apparently had a CAT scan of  his chest done on February 11, and on review of this, it appears that he  has a loculated effusion on the right. He has also been having episodes  of coughing. I had immediately suggested to [name] wife that she  call 911 and have him brought to the nearest hospital for further  evaluation, and she said that as soon as she was done with the phone  conversation with me she would proceed to do this.     Coralyn Helling, MD  Electronically Signed    VS/MedQ  DD: 09/27/2006  DT: 09/27/2006  Job #: [id number]   cc:   Ines Bloomer, M.D.  Charlaine Dalton. Sherene Sires, MD, FCCP

## 2010-12-28 NOTE — Op Note (Signed)
[name], [name]                ACCOUNT NO.:  [id number]   MEDICAL RECORD NO.:  [id number]          PATIENT TYPE:  INP   LOCATION:  2307                         FACILITY:  [facility]   PHYSICIAN:  Ines Bloomer, M.D. DATE OF BIRTH:  [birth year]   DATE OF PROCEDURE:  DATE OF DISCHARGE:                               OPERATIVE REPORT   PREOPERATIVE DIAGNOSIS:  Probable empyema, right chest.   POSTOPERATIVE DIAGNOSIS:  Chronic empyema, right chest.   SURGEON:  Ines Bloomer, M.D.   ANESTHESIA:  General anesthesia.   This patient was admitted with loculated effusions on the right.  Had  previously had a medial basilar right lower lobe lesion that we have  been following and it had been negative on PET.  It appeared to be kind  of inflammatory.  He came to the hospital with weakness and an elevated  white count, brought to the operating room and had difficult intubation  so a single lumen tube was inserted.  The video bronchoscope was passed  through the endotracheal tube.  The carina was in the midline.  The left  mainstem, left upper lobe and left lower lobe orifices were normal.  The  right mainstem, right upper lobe and right lower lobe orifices were  normal except for the basilar segment where there is extrinsic  narrowing.  Cytologies and cultures taken from this area and the patient  had a large amount of pus in the tracheobronchial tree with very  purulent secretions.  The video bronchoscope was removed.   The patient was turned in the right lateral thoracotomy position and  prepped and draped in the usual sterile manner.  Two trocar sites were  made in the anterior and posterior axillary line of eighth intercostal  space.  Two incisions were made and an attempt was made 2 trocars and we  could not insert it into the pleural cavity because of marked scarring.  So a posterolateral thoracotomy was made over the fifth intercostal  space and dissected down and we decided to go  into the sixth intercostal  space, dividing the latissimus and reflecting the serratus anteriorly.  The sixth intercostal space was entered and we immediately evacuated 400-  500 mL of frank pus.  This was sent for culture and gram stain.  We then  connected the 2 lower trocar sites with cath and started doing a  decortication of the right lower lobe that was markedly adhesed with  thickened tissue.  We were able to free it up enough to get it  reexpanded and then anteriorly, the right middle lobe was freed up  enough to get reexpanded.  The right lower lobe was then dissected off  the diaphragm and get reexpanded.  Several areas of necrotic lung were  resected with the EZ 45 stapler and multiple pleural biopsies were used  and sent for pathologic examination.  After this had been, then anterior  and posterior 36 chest tubes were placed at the trocar sites and tied in  place with 0 silk.  A right angle chest tube was inserted between these  two with another stab wound and sutured in place with 0 silk.  A  Marcaine block was done in the usual fashion.  A single On-Q was  inserted pericostally, subpericostally and then the chest was closed  with 5  pericostals running through the seventh rib and passing around the sixth  rib, #1 Vicryl in the muscle layer, 2-0 Vicryl in the subcutaneous  tissues and Ethicon skin clips.  The patient was returned to the  recovery room in serious condition.      Ines Bloomer, M.D.  Electronically Signed     DPB/MEDQ  D:  09/29/2006  T:  09/30/2006  Job:  [id number]   cc:   Charlaine Dalton. Sherene Sires, MD, [facility]  Theressa Millard, M.D.

## 2010-12-28 NOTE — Discharge Summary (Signed)
[name], [name]                ACCOUNT NO.:  [id number]   MEDICAL RECORD NO.:  [id number]          PATIENT TYPE:  INP   LOCATION:  5714                         FACILITY:  [facility]   PHYSICIAN:  Jerold Coombe, P.A.DATE OF BIRTH:  06-30-1936   DATE OF ADMISSION:  10/15/2006  DATE OF DISCHARGE:  10/18/2006                               DISCHARGE SUMMARY   ADMISSION DIAGNOSES:  1. Deep vein thrombosis, right subclavian and axillary vein with      history of right peripherally inserted central catheter.  2. History of right empyema status post drainage.  3. Constipation.   DISCHARGE/SECONDARY DIAGNOSES:  1. 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  2. History of right empyema status post drainage.  3. Constipation, improved.  4. Hypertension.  5. Degenerative arthritis.  6. Adenocarcinoma of the prostate.  7. Hypothyroidism.  8. Anxiety.  9. Pneumonia treated in January 2008.  10.History of laminectomy and previous rotator cuff surgery.  11.History of liver hemangioma.   HOSPITAL COURSE:  [name] was admitted to [facility] for deep  vein thrombosis of his right upper extremity.  He was started on  Coumadin with a Lovenox bridge.  Coumadin teaching has been ordered.  His PICC line was discontinued.  His antibiotics was converted from IV  Rocephin to oral Augmentin.  At hospitalization, he did report  constipation and was started on Peri-Colace and Dulcolax with   BRIEF HISTORY AND PHYSICAL:  [name] is a 75 year old black male who  is a former smoker who had right lower lobe pneumonia then developed an  empyema.  He ultimately underwent a right thoracotomy with drainage of  the empyema and decortication.  He was discharged home on February 25  with a two-week course of IV Rocephin via right PICC.  At the time of  dictation,  blood cultures have been negative with pleural cultures  showing few micro aerophilic streptococci and AFB fungal cultures have  been negative to date.  Of note, he has also been followed by the  pulmonary service for density in the right lower lobe, but that has been  negative for hyper embolic activity on PET scan.   On October 15, 2006, he saw Dr. Edwyna Shell in followup and complained of  swelling in his right arm.  He had been discharged home with an empyema  tube, and it had been drainage well, and his chest x-ray was stable.  A  Doppler study of his right upper extremity was done showing deep vein  thrombosis of the right subclavian and axillary vein.  It was felt that  he should be admitted to [facility] for anticoagulation and  removal of his PICC line.   HOSPITAL COURSE:  [name] was admitted to the hospital for deep vein  thrombosis of the right upper extremity.  He was started on Coumadin  with a Lovenox bridge.  Coumadin 2 mg has been ordered.  His  PICC line  was discontinued.  His antibiotics was converted from IV Rocephin to  oral Augmentin.  At hospitalization, he did report constipation and was  started on Peri-Colace and Dulcolax, but ultimately required Fleets  enema.  He had positive results.  There is no known complication  following removal of his PICC line, and he primarily remained  hospitalized to see how he responds to Coumadin.  At the time of this  dictation, his INR is only 1.2.  He has received two doses of 7.5 and is  to receive a 10-mg dose of Coumadin on March 7.  As his INR appears to  be increasing appropriately, it is anticipated that he will be ready for  discharge home on October 18, 2006.  Based on his a.m. INR result, it will  be determined at that time whether he will need a few doses of Lovenox  as well until he becomes therapeutic.  [facility] been  [facility] for further monitoring of his empyema too as well as to  administer Lovenox  injections as needed.   DISCHARGE MEDICATIONS:  1. Neurontin 600 mg p.o. q.i.d.  2. Synthroid 50 mcg daily.  3. Micardis/HCTZ 25 mg 1/2 tablet daily.  4. Lopressor 25 mg b.i.d.  5. Roxicodone 1 to 2 tablets every 6 hours as needed.  6. Ativan 1 mg t.i.d. as needed.  7. Augmentin 875 mg b.i.d. x7 more days.  8. Coumadin daily, final dose to be determined at the time of      discharge based on his INR results.  We will determine if he needs      a few more doses of Lovenox at that time as well.   DISCHARGE INSTRUCTIONS:  He is to avoid heavy lifting.  He may resume  his soft diet.  He should increase his activity slowly and may take a  sponge bath while his chest tube is in place.  He should follow up with  Dr. Edwyna Shell in approximately one week with a chest x-ray.  Our office  will contact him regarding specific appointment date and time.  [facility] has been asked to draw up PT/INR lab work every three days and  send these results to Dr. Edwyna Shell.      Jerold Coombe, P.A.     AWZ/MEDQ  D:  10/17/2006  T:  10/18/2006  Job:  [id number]   cc:   Ines Bloomer, M.D.  Charlaine Dalton. Sherene Sires, MD, [facility]  Theressa Millard, M.D.

## 2010-12-28 NOTE — H&P (Signed)
NAME:  PHILLIPS, GOULETTE                          ACCOUNT NO.:  [id number]   MEDICAL RECORD NO.:  [id number]                   PATIENT TYPE:  INP   LOCATION:  2899                                 FACILITY:  [facility]   PHYSICIAN:  Hilda Lias, M.D.                DATE OF BIRTH:  November 24, 1935   DATE OF ADMISSION:  07/26/2003  DATE OF DISCHARGE:                                HISTORY & PHYSICAL   HISTORY OF PRESENT ILLNESS:  Mr. [name] is a gentleman who underwent  decompression at the level of L5-S1 back in 2001.  Lately, he has come to my  office complaining of back pain with radiation down to both legs up to the  point that he feels that he is getting worse, having difficulty going up and  down steps.  He feels that the pain is different from the one he had before  in 2001.  Because of that, we went ahead and we did a cervical and lumbar  myelogram and because of the findings, the patient wants to proceed with  surgery.   PAST MEDICAL HISTORY:  1. Appendectomy.  2. Surgery for both carpal tunnel syndromes.  3. Decompression of the right L5-S1.   ALLERGIES:  None.   SOCIAL HISTORY:  Negative.   MEDICATIONS:  He is taking some medication for allergies.   FAMILY HISTORY:  Family history unremarkable.   REVIEW OF SYSTEMS:  Review of systems positive for back pain and bilateral  leg pain.   PHYSICAL EXAMINATION:  HEENT:  Head, eyes, ears, nose and throat normal.  NECK:  Neck normal.  LUNGS:  Lungs clear.  HEART:  Heart sounds normal.  EXTREMITIES:  Normal.  NEUROLOGICAL:  Mental status and cranial nerves normal.  Strength 5/5,  except for weakening of both quadriceps.  Sensation:  He complains of  numbness on top of both thighs and legs.  Femoral strength maneuver is  positive bilaterally.  Straight leg raise is positive bilaterally at 80  degrees.   IMAGING STUDIES:  The lumbar spine x-ray shows a severe case of degenerative  disk disease, especially at the level of 3-4.  Three  years ago, that level  was completely normal and now the patient has quite a bit of degeneration at  the level of 3-4 with compromise of the L3 nerve roots bilaterally,  especially worse in the left one.  In the cervical spine, he has a central  herniated disk at the level of C6 and 7, but that is completely  asymptomatic.   As part of the workup, he had a barium swallow which showed normal mobility  of the esophagus.   CLINICAL IMPRESSION:  1. Degenerative disk disease at the level of 3-4 with bilateral L3     radiculopathy, left worse than the right one.  2. Central disk at the level of C6-C7.   RECOMMENDATION:  The patient is being admitted  for L3-L4 decompressive  laminectomy with interbody fusion, posterolateral fusion with pedicle  screws.  The surgery was fully explained to him including the risks such as  damage to the vessels of the abdomen, CSF leak, infection, no improvement  whatsoever, no correction of the pain, need for further surgery which might  involve the same level or levels above and below, although they are at the  present time negative.  The patient declined more opinion.                                                Hilda Lias, M.D.    EB/MEDQ  D:  07/26/2003  T:  07/26/2003  Job:  [id number]

## 2010-12-28 NOTE — Op Note (Signed)
NAME:  DEREN, DEGRAZIA                          ACCOUNT NO.:  [id number]   MEDICAL RECORD NO.:  [id number]                   PATIENT TYPE:  OBV   LOCATION:  0378                                 FACILITY:  [facility]   PHYSICIAN:  Sigmund I. Patsi Sears, M.D.         DATE OF BIRTH:  1935/08/19   DATE OF PROCEDURE:  10/27/2003  DATE OF DISCHARGE:                                 OPERATIVE REPORT   PREOPERATIVE DIAGNOSIS:  Chronic urinary retention.   POSTOPERATIVE DIAGNOSIS:  Chronic urinary retention.   OPERATION:  TURP.   SURGEON:  Sigmund I. Patsi Sears, M.D.   ANESTHESIA:  Spinal.   PREPARATION:  After appropriate preanesthesia, the patient is brought to the  operating room and placed on the operating table in the dorsal supine  position where spinal anesthetic was introduced.  He was then re-placed in  the dorsal lithotomy position, where the pubis was prepped with Betadine  solution and draped in the usual fashion.   REVIEW OF HISTORY:  The patient is a 75 year old black male, status post  decompression of L5-S1, in 2001, and again repeat surgery in 2005.  Postoperatively, the patient developed chronic urinary retention despite  double dose Flomax and addition of Urecholine therapy.  The patient has  failed multiple voiding trials.  He is now for TURP and suprapubic tube for  possible chronic retention.   DESCRIPTION OF PROCEDURE:  With the patient in the dorsal lithotomy  position, cystoscopy revealed a large to median lobe with bilobar BPH.  The  veru was maintained and viewed throughout the resection.  The ureteral  orifices were identified, and clear efflux was seen from both orifices.  There was trabeculation but no definite cellule formation.  There were no  bladder stone, tumor, or diverticular formation.   Resection was begun of the median lobe was begun at the 7 o'clock position  and carried to the 5 o'clock.  Resection of the prostate was then started at  the 11 o'clock  position and carried to the 7 o'clock position and from the 1  o'clock to the 5 o'clock positions.  Following this, bleeding was  identified, particularly from the right lateral wall of the prostate which  was extensively cauterized.  A 24 Symplastic three-way Foley catheter was  passed with traction and three-way irrigation.  At the beginning of the  case, cystoscopy was accomplished and a Microvasive suprapubic tube placed  without difficulty.  This was done under direct vision and was closed and  placed to a closed valve.   The patient was taken to the recovery room in good condition where bleeding  will be monitored and possible Amicar started.                                               Sigmund  Bridget Hartshorn, M.D.    SIT/MEDQ  D:  10/27/2003  T:  10/27/2003  Job:  [id number]

## 2010-12-28 NOTE — Discharge Summary (Signed)
NAME:  [name], [name]                          ACCOUNT NO.:  [id number]   MEDICAL RECORD NO.:  [id number]                   PATIENT TYPE:  INP   LOCATION:  3006                                 FACILITY:  [facility]   PHYSICIAN:  Hilda Lias, M.D.                DATE OF BIRTH:  10-01-1935   DATE OF ADMISSION:  07/26/2003  DATE OF DISCHARGE:  08/01/2003                                 DISCHARGE SUMMARY   ADMISSION DIAGNOSES:  1. Degenerative disk disease with spondylolisthesis at the level of 3-4.  2. Urinary retention.   POSTOPERATIVE DIAGNOSES:  1. Degenerative disk disease with spondylolisthesis at the level of 3-4.  2. Urinary retention.   CLINICAL HISTORY:  The patient was admitted because of back pain with  radiation to both legs.  In the past, he underwent fusion.  The x-ray showed  degenerative disk disease at the level of 3-4.  Surgery was advised.   LABORATORY:  Normal.   COURSE IN THE HOSPITAL:  The patient was taken to surgery and a C3-4  diskectomy with fusion was done.  The patient did well.  He developed some  urinary retention which was taken care of by the urologist.  On the day of  admission, the patient was walking with no difficulty and he was ready to go  home.   CONDITION ON DISCHARGE:  Improving.   DISCHARGE MEDICATIONS:  Percocet.   DIET:  Regular.   ACTIVITY:  Not to drive for at least two weeks.   FOLLOWUP:  To be seen by me in four weeks or before as needed.                                                Hilda Lias, M.D.    EB/MEDQ  D:  09/09/2003  T:  09/10/2003  Job:  [id number]

## 2010-12-28 NOTE — H&P (Signed)
[name], [name]                ACCOUNT NO.:  [id number]   MEDICAL RECORD NO.:  [id number]          PATIENT TYPE:  INP   LOCATION:  1405                         FACILITY:  [facility]   PHYSICIAN:  Kela Millin, M.D.DATE OF BIRTH:  12/29/1935   DATE OF ADMISSION:  09/27/2006  DATE OF DISCHARGE:                              HISTORY & PHYSICAL   PRIMARY CARE PHYSICIAN:  Dr. Theressa Millard.   CHIEF COMPLAINT:  Cough with weakness and confusion.   HISTORY OF PRESENT ILLNESS:  The patient is a pleasant 75 year old black  male with past medical history significant for pneumonia diagnosed in  January 2008 treated as outpatient, loculated right pleural effusion per  CT scan of chest on August 22, 2006, hypertension, hypothyroidism and  anxiety who presents with above complaints. The patient states that he  went to the [facility] January 24 and was diagnosed with  pneumonia, and following a IM injection at the office of antibiotics, he  was prescribed Avelox which he took for 6 days. He subsequently followed  up with Dr. Sherene Sires on September 18, 2006, and per Dr. Foye Clock note, he  deferred further management of the right middle lobe process to Dr.  Edwyna Shell. He also indicated that the patient was complaining of a  nonproductive cough, and he discontinued his ACE inhibitor and put him  on an ARB. He states that since February 7 his symptoms have  progressively worsened - his cough is productive of yellowish phlegm,  his weakness has worsened. The patient's wife also states that the  patient has been incoherent and appeared to have developed some swelling  his ankle areas. The patient denies shortness of breath, PND, fevers,  chest pain, nausea, vomiting, dysuria, melena, hemoptysis and diarrhea  and no hematochezia.   The patient was seen in the ER, and the chest x-ray revealed increased  right pleural effusion and right lower lobe infiltrate. He was also  afebrile in the ER  with a T-max of 101.9 rectally. Lab work also  revealed some electrolyte abnormalities, and he is admitted for further  evaluation and management.   PAST MEDICAL HISTORY:  As stated above.   MEDICATIONS:  1. Gabapentin 600 mg q.6h.  2. Synthroid 50 mcg daily.  3. Ativan 1 mg t.i.d.  4. Diazide 5 mg.  5. Ibuprofen 800 mg q.6h.  6. Micardis 80/25 mg.   ALLERGIES:  No known drug allergies..   SOCIAL HISTORY:  He quit tobacco in 1983. He denies alcohol.   FAMILY HISTORY:  His mother had colon and breast cancer. His father had  diabetes.   REVIEW OF SYSTEMS:  As per HPI.   PHYSICAL EXAMINATION:  In general, the patient is an elderly black male,  acutely ill-appearing in no respiratory distress with nasal cannula  oxygen on.  VITAL SIGNS:  Max temperature in ER 101.9 rectally, current temperature  100.1 orally. Pulse 98, respiratory rate of 17, O2 saturation 100% on O2  - initially 85%.  HEENT:  PERRL. EOMI. Slightly dry mucous membranes. No oral exudates.  Sclerae anicteric.  NECK:  Supple.  No adenopathy and no JVD. No carotid bruits are  appreciated.  LUNGS:  Decreased breath sounds in the right lower lung field. No  wheezes and no crackles.  CARDIOVASCULAR:  Regular rate and rhythm. Normal S1 and S2.  ABDOMEN:  Soft, bowel sounds present, nontender, nondistended. No  organomegaly and no masses palpable.  EXTREMITIES:  No cyanosis and no edema appreciated.  NEUROLOGICAL:  He is alert and oriented x3, appropriate. Cranial nerves  II-XII grossly intact. Nonfocal exam.   LABORATORY DATA:  CT scan of chest from February 11:  Loculated right  pleural collections with enhancement favoring empyema. Mediastinal  adenopathy likely reactive per radiologist. Chest x-ray - increased  right effusion and right lower lobe atelectasis/consolidation per  radiology. His white cell count is 8.6 with a hemoglobin of 10.3,  hematocrit 31.1, platelet count 383, neutrophil count 85%. Sodium 138   with a potassium of 2.8, chloride 100, CO2 32, glucose of 113, BUN 8,  creatinine 0.62, calcium 8.4. His total protein is 6.7, albumin 1.9, AST  23, ALT 25. Urinalysis is negative for infection.   ASSESSMENT AND PLAN:  1. Right lower lobe pneumonia with loculated right pleural effusion -      as discussed above, will obtain cultures, placed on empiric      antibiotics. The patient was to follow up with Dr. Edwyna Shell on      Wednesday, October 01, 2006. I have consulted [facility], Dr. Donata Clay,      to see patient.  2. Hypokalemia - replace potassium.  3. Hypertension - continue outpatient medications.  4. Hypothyroidism - continue Synthroid.  5. History of anxiety - continue Ativan.      Kela Millin, M.D.  Electronically Signed     ACV/MEDQ  D:  09/28/2006  T:  09/28/2006  Job:  [id number]   cc:   Theressa Millard, M.D.  Fax: [phone]

## 2010-12-28 NOTE — H&P (Signed)
NAMEDEKENDRICK, UZELAC NO.:  [id number]   MEDICAL RECORD NO.:  [id number]          PATIENT TYPE:  INP   LOCATION:  5714                         FACILITY:  [facility]   PHYSICIAN:  Ines Bloomer, M.D. DATE OF BIRTH:  10/30/1935   DATE OF ADMISSION:  10/15/2006  DATE OF DISCHARGE:                              HISTORY & PHYSICAL   HISTORY OF PRESENT ILLNESS:  This is a 75 year old black male who is a  former smoker who has had right lower lobe pneumonia and then developed  an empyema.  He was treated with rib resection and insertion of chest  empyema tubes. He has been followed by the pulmonary service for a  density in the right lower lobe that has been negative for hyper  __________  of activity on PET.  He also had an episode of recent  hospitalization for weakness and shortness of breath.  After his rib  resection and drainage of his empyema.  A PICC line was inserted and he  was discharged on Rocephin.  He came into the office today complaining  of swollen right arm.  His empyema tube was draining well and his chest  x-ray was stable but actually would move to the anterior empyema tube.  Venous Doppler showed deep venous thrombosis of the right subclavian and  axillary vein.  He has been admitted for anticoagulation for removal of  the PICC line.   PAST MEDICAL HISTORY:  Significant for:  1. Hypertension.  2. Degenerative arthritis.  3. Had a laminectomy.  4. Previous rotator cuff surgery.  5. He has a Gleason 7 adenocarcinoma of the prostate.  6. He has liver hemangioma.  7. Hypothyroid.  8. Anxiety disorder.   MEDICATIONS:  Include:  1. Protonix 40 mg a day.  2. Ativan 1 mg t.i.d.  3. Micardis 80/25 daily.  4. Synthroid 0.05 daily.   SOCIAL HISTORY:  Married, retired, worked in [employer].  Quit  smoking in 1983.  He does not drink alcohol on a regular basis.   FAMILY HISTORY:  Positive for cancer, negative for cardiac disease.   REVIEW OF  SYSTEMS:  He has had a recent weight loss in the last month.  CARDIAC:  No angina or atrial fibrillation.  PULMONARY:  See history of  present illness.  GI:  No nausea, vomiting, constipation, or diarrhea.  GU:  See past medical history.  Positive for prostate cancer.  VASCULAR:  No claudication or TIAs.  DVT: See history of present illness.  HEMATOLOGICAL:  No problems with bleeding but has anemia.  NEUROLOGICAL:  No headaches, blackouts or seizures.  Recent MRI was negative.  SKIN:  Without lesions.   PHYSICAL EXAMINATION:  Blood pressure 140/70.  Pulse 88.  Respirations  20.  Saturating at 98%.  HEENT:  Head is atraumatic.  Eyes:  Pupils equal, round and reactive to  light and accommodation.  Extraocular movements are normal.  Ears:  Tympanic membranes are intact.  Nose:  No septal deviation.  Mouth  without lesions.  NECK:  Supple without thyromegaly.  There is no carotid bruits.  Supraclavicular or axillary adenopathy.  CHEST:  There is right empyema tube.  HEART:  Regular sinus rhythm, no murmurs.  ABDOMEN:  There is no hepatosplenomegaly.  EXTREMITIES:  Pulse is 2+, no clubbing or edema.  There is swelling of  the right arm.  There is a PICC line in the right arm.   IMPRESSION:  1. Deep vein thrombosis, right subclavian vein and axillary vein.  2. History of empyema status post drainage.  3. Hypertension.  4. Degenerative arthritis.  5. Adenocarcinoma of the prostate.  6. Hypothyroidism.   PLAN:  Admission to the hospital with Lovenox and Coumadin.  Removal of  PICC line.      Ines Bloomer, M.D.  Electronically Signed     DPB/MEDQ  D:  10/14/2006  T:  10/15/2006  Job:  [id number]

## 2011-12-02 ENCOUNTER — Emergency Department (HOSPITAL_COMMUNITY): Payer: Medicare Other

## 2011-12-02 ENCOUNTER — Inpatient Hospital Stay (HOSPITAL_COMMUNITY)
Admission: EM | Admit: 2011-12-02 | Discharge: 2011-12-04 | DRG: 093 | Disposition: A | Discharge status: Home / Self Care | Payer: Medicare Other | Attending: Internal Medicine | Admitting: Internal Medicine

## 2011-12-02 ENCOUNTER — Other Ambulatory Visit: Payer: Self-pay

## 2011-12-02 ENCOUNTER — Encounter (HOSPITAL_COMMUNITY): Payer: Self-pay

## 2011-12-02 DIAGNOSIS — R05 Cough: Secondary | ICD-10-CM | POA: Diagnosis present

## 2011-12-02 DIAGNOSIS — D72829 Elevated white blood cell count, unspecified: Secondary | ICD-10-CM | POA: Diagnosis present

## 2011-12-02 DIAGNOSIS — G929 Unspecified toxic encephalopathy: Principal | ICD-10-CM | POA: Diagnosis present

## 2011-12-02 DIAGNOSIS — I10 Essential (primary) hypertension: Secondary | ICD-10-CM

## 2011-12-02 DIAGNOSIS — E039 Hypothyroidism, unspecified: Secondary | ICD-10-CM

## 2011-12-02 DIAGNOSIS — R4182 Altered mental status, unspecified: Secondary | ICD-10-CM | POA: Diagnosis present

## 2011-12-02 DIAGNOSIS — R509 Fever, unspecified: Secondary | ICD-10-CM | POA: Diagnosis present

## 2011-12-02 DIAGNOSIS — R059 Cough, unspecified: Secondary | ICD-10-CM | POA: Diagnosis present

## 2011-12-02 DIAGNOSIS — Z8546 Personal history of malignant neoplasm of prostate: Secondary | ICD-10-CM

## 2011-12-02 DIAGNOSIS — Z87891 Personal history of nicotine dependence: Secondary | ICD-10-CM

## 2011-12-02 DIAGNOSIS — G92 Toxic encephalopathy: Principal | ICD-10-CM | POA: Diagnosis present

## 2011-12-02 HISTORY — DX: Essential (primary) hypertension: I10

## 2011-12-02 HISTORY — DX: Disorder of thyroid, unspecified: E07.9

## 2011-12-02 LAB — DIFFERENTIAL
Basophils Absolute: 0 10*3/uL (ref 0.0–0.1)
Basophils Relative: 0 % (ref 0–1)
Eosinophils Absolute: 0 10*3/uL (ref 0.0–0.7)
Eosinophils Relative: 0 % (ref 0–5)
Lymphocytes Relative: 2 % — ABNORMAL LOW (ref 12–46)

## 2011-12-02 LAB — TROPONIN I: Troponin I: <0.3 ng/mL (ref <0.30)

## 2011-12-02 LAB — URINALYSIS, ROUTINE W REFLEX MICROSCOPIC
Bilirubin Urine: NEGATIVE
Hgb urine dipstick: NEGATIVE
Nitrite: NEGATIVE
Specific Gravity, Urine: 1.017 (ref 1.005–1.030)
pH: 8 (ref 5.0–8.0)

## 2011-12-02 LAB — RAPID URINE DRUG SCREEN, HOSP PERFORMED
Benzodiazepines: POSITIVE — AB
Cocaine: NOT DETECTED

## 2011-12-02 LAB — CBC
Hemoglobin: 12.9 g/dL — ABNORMAL LOW (ref 13.0–17.0)
MCH: 29.3 pg (ref 26.0–34.0)
MCV: 88.2 fL (ref 78.0–100.0)
Platelets: 154 10*3/uL (ref 150–400)
RBC: 4.41 MIL/uL (ref 4.22–5.81)
RDW: 14 % (ref 11.5–15.5)
WBC: 15.5 10*3/uL — ABNORMAL HIGH (ref 4.0–10.5)

## 2011-12-02 LAB — COMPREHENSIVE METABOLIC PANEL
ALT: 11 U/L (ref 0–53)
AST: 21 U/L (ref 0–37)
Albumin: 3.4 g/dL — ABNORMAL LOW (ref 3.5–5.2)
CO2: 21 mEq/L (ref 19–32)
Calcium: 9.3 mg/dL (ref 8.4–10.5)
Sodium: 135 mEq/L (ref 135–145)
Total Protein: 6.5 g/dL (ref 6.0–8.3)

## 2011-12-02 LAB — CREATININE, SERUM: Creatinine, Ser: 0.94 mg/dL (ref 0.50–1.35)

## 2011-12-02 LAB — POCT I-STAT, CHEM 8
Chloride: 106 mEq/L (ref 96–112)
Creatinine, Ser: 0.9 mg/dL (ref 0.50–1.35)
Glucose, Bld: 131 mg/dL — ABNORMAL HIGH (ref 70–99)
HCT: 42 % (ref 39.0–52.0)
Potassium: 3.7 mEq/L (ref 3.5–5.1)
Sodium: 140 mEq/L (ref 135–145)

## 2011-12-02 LAB — LACTIC ACID, PLASMA: Lactic Acid, Venous: 1.5 mmol/L (ref 0.5–2.2)

## 2011-12-02 LAB — CK TOTAL AND CKMB (NOT AT ARMC): Relative Index: 1.4 (ref 0.0–2.5)

## 2011-12-02 LAB — PROTIME-INR: Prothrombin Time: 14.6 seconds (ref 11.6–15.2)

## 2011-12-02 MED ORDER — ALBUTEROL SULFATE (5 MG/ML) 0.5% IN NEBU: 2.5000 mg | INHALATION_SOLUTION | Freq: Four times a day (QID) | RESPIRATORY_TRACT | Status: DC | PRN

## 2011-12-02 MED ORDER — ACETAMINOPHEN 325 MG PO TABS: 650.0000 mg | ORAL_TABLET | Freq: Four times a day (QID) | ORAL | Status: DC | PRN

## 2011-12-02 MED ORDER — AMLODIPINE BESYLATE 10 MG PO TABS
10.0000 mg | ORAL_TABLET | Freq: Every day | ORAL | Status: DC
  Administered 2011-12-03 – 2011-12-04 (×2): 10 mg via ORAL
  Filled 2011-12-02 (×2): qty 1

## 2011-12-02 MED ORDER — ONDANSETRON HCL 4 MG/2ML IJ SOLN: 4.0000 mg | Freq: Four times a day (QID) | INTRAMUSCULAR | Status: DC | PRN

## 2011-12-02 MED ORDER — LEVOTHYROXINE SODIUM 50 MCG PO TABS
50.0000 ug | ORAL_TABLET | ORAL | Status: DC
  Administered 2011-12-03 – 2011-12-04 (×2): 50 ug via ORAL
  Filled 2011-12-02 (×3): qty 1

## 2011-12-02 MED ORDER — VANCOMYCIN HCL IN DEXTROSE 1-5 GM/200ML-% IV SOLN
1000.0000 mg | Freq: Two times a day (BID) | INTRAVENOUS | Status: DC
  Administered 2011-12-03: 1000 mg via INTRAVENOUS
  Filled 2011-12-02: qty 200

## 2011-12-02 MED ORDER — ACETAMINOPHEN 650 MG RE SUPP: 650.0000 mg | Freq: Four times a day (QID) | RECTAL | Status: DC | PRN

## 2011-12-02 MED ORDER — VANCOMYCIN HCL IN DEXTROSE 1-5 GM/200ML-% IV SOLN
1000.0000 mg | Freq: Once | INTRAVENOUS | Status: AC
  Administered 2011-12-02: 1000 mg via INTRAVENOUS
  Filled 2011-12-02: qty 200

## 2011-12-02 MED ORDER — LEVOTHYROXINE SODIUM 75 MCG PO TABS: 75.0000 ug | ORAL_TABLET | ORAL | Status: DC

## 2011-12-02 MED ORDER — ACETAMINOPHEN 325 MG PO TABS
ORAL_TABLET | ORAL | Status: AC
  Filled 2011-12-02: qty 2

## 2011-12-02 MED ORDER — ENOXAPARIN SODIUM 40 MG/0.4ML ~~LOC~~ SOLN
40.0000 mg | SUBCUTANEOUS | Status: DC
  Administered 2011-12-02 – 2011-12-03 (×2): 40 mg via SUBCUTANEOUS
  Filled 2011-12-02 (×3): qty 0.4

## 2011-12-02 MED ORDER — ACETAMINOPHEN 325 MG PO TABS
650.0000 mg | ORAL_TABLET | Freq: Once | ORAL | Status: AC
  Administered 2011-12-02: 650 mg via ORAL

## 2011-12-02 MED ORDER — SODIUM CHLORIDE 0.9 % IV SOLN
INTRAVENOUS | Status: DC
  Administered 2011-12-02: 19:00:00 via INTRAVENOUS

## 2011-12-02 MED ORDER — SENNA 8.6 MG PO TABS
2.0000 | ORAL_TABLET | Freq: Every day | ORAL | Status: DC
  Administered 2011-12-02 – 2011-12-03 (×2): 17.2 mg via ORAL
  Filled 2011-12-02 (×3): qty 2

## 2011-12-02 MED ORDER — VITAMIN D (ERGOCALCIFEROL) 1.25 MG (50000 UNIT) PO CAPS
50000.0000 [IU] | ORAL_CAPSULE | ORAL | Status: DC
  Administered 2011-12-03: 50000 [IU] via ORAL
  Filled 2011-12-02 (×2): qty 1

## 2011-12-02 MED ORDER — PIPERACILLIN-TAZOBACTAM 3.375 G IVPB
3.3750 g | Freq: Once | INTRAVENOUS | Status: AC
  Administered 2011-12-02: 3.375 g via INTRAVENOUS
  Filled 2011-12-02: qty 50

## 2011-12-02 MED ORDER — FLUTICASONE PROPIONATE 50 MCG/ACT NA SUSP
2.0000 | Freq: Every day | NASAL | Status: DC
Start: 2011-12-03 — End: 2011-12-04
  Administered 2011-12-03 – 2011-12-04 (×2): 2 via NASAL
  Filled 2011-12-02: qty 16

## 2011-12-02 MED ORDER — LORAZEPAM 1 MG PO TABS
1.0000 mg | ORAL_TABLET | Freq: Two times a day (BID) | ORAL | Status: DC
  Administered 2011-12-02 – 2011-12-04 (×4): 1 mg via ORAL
  Filled 2011-12-02 (×4): qty 1

## 2011-12-02 MED ORDER — PANTOPRAZOLE SODIUM 40 MG PO TBEC
40.0000 mg | DELAYED_RELEASE_TABLET | Freq: Every day | ORAL | Status: DC
  Administered 2011-12-03 – 2011-12-04 (×2): 40 mg via ORAL
  Filled 2011-12-02: qty 1

## 2011-12-02 MED ORDER — DUTASTERIDE 0.5 MG PO CAPS
0.5000 mg | ORAL_CAPSULE | Freq: Every day | ORAL | Status: DC
  Administered 2011-12-03 – 2011-12-04 (×2): 0.5 mg via ORAL
  Filled 2011-12-02 (×2): qty 1

## 2011-12-02 MED ORDER — BENZONATATE 100 MG PO CAPS
100.0000 mg | ORAL_CAPSULE | Freq: Two times a day (BID) | ORAL | Status: DC
  Administered 2011-12-02 – 2011-12-04 (×4): 100 mg via ORAL
  Filled 2011-12-02 (×5): qty 1

## 2011-12-02 MED ORDER — IMIPRAMINE HCL 25 MG PO TABS
25.0000 mg | ORAL_TABLET | Freq: Two times a day (BID) | ORAL | Status: DC
  Administered 2011-12-02 – 2011-12-04 (×4): 25 mg via ORAL
  Filled 2011-12-02 (×5): qty 1

## 2011-12-02 MED ORDER — PIPERACILLIN-TAZOBACTAM 3.375 G IVPB: 3.3750 g | Freq: Four times a day (QID) | INTRAVENOUS | Status: DC

## 2011-12-02 MED ORDER — PIPERACILLIN-TAZOBACTAM 3.375 G IVPB
3.3750 g | Freq: Three times a day (TID) | INTRAVENOUS | Status: DC
  Filled 2011-12-02 (×2): qty 50

## 2011-12-02 MED ORDER — SODIUM CHLORIDE 0.45 % IV SOLN: INTRAVENOUS | Status: DC

## 2011-12-02 MED ORDER — PIPERACILLIN-TAZOBACTAM 3.375 G IVPB
3.3750 g | Freq: Three times a day (TID) | INTRAVENOUS | Status: DC
  Administered 2011-12-03 – 2011-12-04 (×5): 3.375 g via INTRAVENOUS
  Filled 2011-12-02 (×10): qty 50

## 2011-12-02 MED ORDER — OXYCODONE HCL 5 MG PO TABS
10.0000 mg | ORAL_TABLET | Freq: Four times a day (QID) | ORAL | Status: DC | PRN
  Administered 2011-12-02 – 2011-12-04 (×4): 10 mg via ORAL
  Filled 2011-12-02 (×4): qty 2

## 2011-12-02 MED ORDER — DARIFENACIN HYDROBROMIDE ER 15 MG PO TB24
15.0000 mg | ORAL_TABLET | Freq: Every day | ORAL | Status: DC
  Administered 2011-12-03 – 2011-12-04 (×2): 15 mg via ORAL
  Filled 2011-12-02 (×2): qty 1

## 2011-12-02 MED ORDER — ONDANSETRON HCL 4 MG PO TABS: 4.0000 mg | ORAL_TABLET | Freq: Four times a day (QID) | ORAL | Status: DC | PRN

## 2011-12-02 NOTE — Consult Note (Addendum)
Referring Physician: Jeraldine Loots    Chief Complaint: Difficulty with gait, slurred speech  HPI: [name] is an 76 y.o. male who was at his baseline when he was left by his wife this morning.  When neighbors saw him trying to get in his car he was barely able to walk.  They noted that he had slurred speech as well.  EMS was called.  Patient was brought in as a code stroke.  Initial NIHSS of 5.  LSN: [id number] tPA Given: No: Not felt to be a stroke  Past Medical History  Diagnosis Date  . Prostate cancer     hx of prostate ca  . Hypertension   . Thyroid disease     Past Surgical History  Procedure Date  . Back surgery     History reviewed. No pertinent family history. Social History:  reports that he has quit smoking. He does not have any smokeless tobacco history on file. He reports that he does not drink alcohol or use illicit drugs.  Allergies: No Known Allergies  Medications: I have reviewed the patient's current medications. Prior to Admission: Unkown   ROS: History obtained from wife  General ROS: negative for - chills, fatigue, fever, night sweats, weight gain or weight loss Psychological ROS: negative for - behavioral disorder, hallucinations, memory difficulties, mood swings or suicidal ideation Ophthalmic ROS: negative for - blurry vision, double vision, eye pain or loss of vision ENT ROS: negative for - epistaxis, nasal discharge, oral lesions, sore throat, tinnitus or vertigo Allergy and Immunology ROS: negative for - hives or itchy/watery eyes Hematological and Lymphatic ROS: negative for - bleeding problems, bruising or swollen lymph nodes Endocrine ROS: negative for - galactorrhea, hair pattern changes, polydipsia/polyuria or temperature intolerance Respiratory ROS: chronic cough Cardiovascular ROS: negative for - chest pain, dyspnea on exertion, edema or irregular heartbeat Gastrointestinal ROS: negative for - abdominal pain, diarrhea, hematemesis,  nausea/vomiting or stool incontinence Genito-Urinary ROS: cough incontinence Musculoskeletal ROS: negative for - joint swelling or muscular weakness Neurological ROS: as noted in HPI Dermatological ROS: negative for rash and skin lesion changes  Physical Examination: Blood pressure 136/65, pulse 83, temperature 102.9 F (39.4 C), temperature source Oral, resp. rate 14, SpO2 96.00%.  Neurologic Examination: Mental Status: Alert, oriented, thought content appropriate.  Speech fluent without evidence of aphasia.  Able to follow 3 step commands without difficulty. Cranial Nerves: II: visual fields grossly normal, pupils equal, round, reactive to light and accommodation III,IV, VI: ptosis not present, extra-ocular motions intact bilaterally V,VII: smile symmetric, facial light touch sensation normal bilaterally VIII: hearing normal bilaterally IX,X: gag reflex present XI: trapezius strength/neck flexion strength normal bilaterally XII: tongue strength normal  Motor: Right : Upper extremity   5-/5    Left:     Upper extremity   5-/5  Lower extremity   4+/5    Lower extremity   4+/5 Tone and bulk:normal tone throughout; no atrophy noted  Edema in the bilateral lower extremities Sensory: Pinprick and light touch intact throughout, bilaterally Deep Tendon Reflexes: 1+ in the upper extremities and absent in the lower extremities Plantars: Right: mute   Left: mute Cerebellar: normal finger-to-nose and normal heel-to-shin test   No results found for this or any previous visit (from the past 48 hour(s)). Ct Head Wo Contrast  12/02/2011  *RADIOLOGY REPORT*  Clinical Data: Code stroke.  Unsteady gait.  Confusion.  CT HEAD WITHOUT CONTRAST  Technique:  Contiguous axial images were obtained from the base of the  skull through the vertex without contrast.  Comparison: None.  Findings: The brain shows generalized atrophy.  There are chronic small vessel changes affecting the hemispheric white matter.   No sign of acute infarction, mass lesion, hemorrhage, hydrocephalus or extra-axial collection.  The calvarium is unremarkable.  Sinuses, middle ears and mastoids are clear.  IMPRESSION:  Brain atrophy.  No acute or focal finding.  Critical Value/emergent results were called by telephone at the time of interpretation on 12/02/2011  at 1322 hours  to  Dr. Jeraldine Loots, who verbally acknowledged these results.  Original Report Authenticated By: Thomasenia Sales, M.D.    Assessment: 76 y.o. male presenting with difficulty with gait and slurred speech.  No focality noted on exam.  CT shows no acute changes.  Patient febrile on initial intake.  Acute infarct unlikely at this time.  Suspect infection as cause of decrease in functional ability.    Stroke Risk Factors - hypertension  Plan: 1. No further neurologic intervention is recommended at this time.  If further questions arise, please call or page at that time.  Thank you for allowing neurology to participate in the care of this patient.  Case discussed with Dr. Jeraldine Loots.  Thana Farr, MD [facility] [phone] 12/02/2011  1:43 PM

## 2011-12-02 NOTE — ED Notes (Signed)
Floor unable to take report at this time.

## 2011-12-02 NOTE — ED Notes (Signed)
Family at bedside. 

## 2011-12-02 NOTE — ED Provider Notes (Signed)
History     CSN: [id number]  Arrival date & time 12/02/11  1303   First MD Initiated Contact with Patient 12/02/11 1318      Chief Complaint  Patient presents with  . Code Stroke     HPI The patient presents with concerns of altered mental status.  The patient presents with his wife who relates some of the history of present illness.  The patient was last seen normal approximately 5 hours prior to presentation.  Just prior to presentation the patient was witnessed to be having difficulty ambulating, with an inability to transfer from the ground into a car.  Per report he also was speaking in a nondiscernible manner.  This report is per neighbors.  The patient's wife notes that when she returned to find the patient he was speaking clearly, though she denies any slurring.  She states that he also appeared listless.  The patient denies any unilateral weakness, confusion.  He states that he has been fatigued over the past 2 days, and continues to have a chronic cough.  He denies any pain. Past Medical History  Diagnosis Date  . Prostate cancer     hx of prostate ca  . Hypertension   . Thyroid disease     Past Surgical History  Procedure Date  . Back surgery     History reviewed. No pertinent family history.  History  Substance Use Topics  . Smoking status: Former Games developer  . Smokeless tobacco: Not on file  . Alcohol Use: No      Review of Systems  Constitutional:       Per HPI, otherwise negative  HENT:       Per HPI, otherwise negative  Eyes: Negative.   Respiratory:       Per HPI, otherwise negative  Cardiovascular:       Per HPI, otherwise negative  Gastrointestinal: Negative for vomiting.  Genitourinary: Negative.   Musculoskeletal:       Per HPI, otherwise negative  Skin: Negative.   Neurological: Negative for syncope.    Allergies  Review of patient's allergies indicates no known allergies.  Home Medications  No current outpatient prescriptions on  file.  BP 136/65  Pulse 83  Temp(Src) 102.9 F (39.4 C) (Oral)  Resp 14  SpO2 96%  Physical Exam  Nursing note and vitals reviewed. Constitutional: He is oriented to person, place, and time. He appears well-developed. No distress.  HENT:  Head: Normocephalic and atraumatic.  Eyes: Conjunctivae and EOM are normal.  Cardiovascular: Normal rate and regular rhythm.   Pulmonary/Chest: Effort normal. No stridor. No respiratory distress.  Abdominal: He exhibits no distension.  Musculoskeletal: He exhibits no edema.  Neurological: He is alert and oriented to person, place, and time. He has normal strength. He displays no atrophy and no tremor. No cranial nerve deficit or sensory deficit. He exhibits normal muscle tone. He displays a negative Romberg sign. He displays no seizure activity. Coordination normal.       Patient has some difficulty with the exact date, but is otherwise appropriately awake and alert. Strength and alternatives is 5/5. No dysmetria or dysdiadochokinesia  Skin: Skin is warm and dry.  Psychiatric: He has a normal mood and affect.    ED Course  Procedures (including critical care time)  Labs Reviewed  CBC - Abnormal; Notable for the following:    WBC 15.5 (*)    All other components within normal limits  DIFFERENTIAL - Abnormal; Notable for the following:  Neutrophils Relative 92 (*)    Neutro Abs 14.3 (*)    Lymphocytes Relative 2 (*)    Lymphs Abs 0.4 (*)    All other components within normal limits  COMPREHENSIVE METABOLIC PANEL - Abnormal; Notable for the following:    Glucose, Bld 129 (*)    Albumin 3.4 (*)    GFR calc non Af Amer 85 (*)    All other components within normal limits  GLUCOSE, CAPILLARY - Abnormal; Notable for the following:    Glucose-Capillary 129 (*)    All other components within normal limits  POCT I-STAT, CHEM 8 - Abnormal; Notable for the following:    Glucose, Bld 131 (*)    All other components within normal limits   PROTIME-INR  APTT  CK TOTAL AND CKMB  TROPONIN I  URINE RAPID DRUG SCREEN (HOSP PERFORMED)  URINALYSIS, ROUTINE W REFLEX MICROSCOPIC  CULTURE, BLOOD (ROUTINE X 2)  CULTURE, BLOOD (ROUTINE X 2)  LACTIC ACID, PLASMA   Ct Head Wo Contrast  12/02/2011  *RADIOLOGY REPORT*  Clinical Data: Code stroke.  Unsteady gait.  Confusion.  CT HEAD WITHOUT CONTRAST  Technique:  Contiguous axial images were obtained from the base of the skull through the vertex without contrast.  Comparison: None.  Findings: The brain shows generalized atrophy.  There are chronic small vessel changes affecting the hemispheric white matter.  No sign of acute infarction, mass lesion, hemorrhage, hydrocephalus or extra-axial collection.  The calvarium is unremarkable.  Sinuses, middle ears and mastoids are clear.  IMPRESSION:  Brain atrophy.  No acute or focal finding.  Critical Value/emergent results were called by telephone at the time of interpretation on 12/02/2011  at 1322 hours  to  Dr. Jeraldine Loots, who verbally acknowledged these results.  Original Report Authenticated By: Thomasenia Sales, M.D.   CT reviewed by me and I discussed these findings with radiology.  No diagnosis found.  Cardiac monitor 81 sinus rhythm normal Pulse oximetry 97% room air normal    Date: 12/02/2011  Rate: 79  Rhythm: normal sinus rhythm  QRS Axis: normal  Intervals: normal  ST/T Wave abnormalities: nonspecific T wave changes  Conduction Disutrbances:PVC  Narrative Interpretation:   Old EKG Reviewed: unchanged  CXR: no acute findings, reviewed by me  MDM  This elderly male presents as a code stroke due to concerns of reported disequilibrium and altered mental status.  On my exam the patient is in no distress with unremarkable vital signs.  The patient has mild delay with date recollection, but is otherwise neurovascularly nearly appropriate.  Upon the patient's arrival, the care was coordinated with the neurologic service.  Code stroke was  called off.  The patient's initial exam is most notable for fever of greater than 102.  Absent evidence of source, there is some concern for viral syndrome.  The patient will be admitted for further evaluation and management. Given the patient's denial of chest pain, dyspnea, dysuria, abdominal pain, nausea, vomiting, diarrhea there is low suspicion for cardiac, pulmonary, GI, GU bacterial infection, but blood cultures are pending. Due to the fever, he did receive an empiric dose of Vancomycin and Zosyn in the emergency department.      Gerhard Munch, MD 12/02/11 1500

## 2011-12-02 NOTE — ED Notes (Signed)
Per EMS, wife last saw pt normal at 8am, at noon neighbor saw him trying to get in car, unable to follow directions, not mentating well

## 2011-12-02 NOTE — H&P (Signed)
PCP:  Darnelle Bos, MD, MD   DOA:  12/02/2011  1:00 PM  Chief Complaint:  Altered mental status  HPI: 76 years old African American man was brought into the ER today as a  Code stroke and he was noted by his neighbor to be confused and unable to get into his car. He was also noted to have an episode of slurring of speech. EMS was called and he was brought into the ER. CT head showed no acute abnormalities, patient was seen by neurology and code stroke was called off. He denies any headache, blurring of vision, neck pain or stiffness, muscle weakness or numbness. The ED he  was noted to be febrile with temperature of 102, as per wife patient has chronic cough for years with no recent worsening, no chills or fever at home. Patient also denied any dysuria or flank pain, he denies any diarrhea or abdominal pain. Workup in the ED was unremarkable. Patient was given a dose of vancomycin and Zosyn, blood cultures were sent and we were consulted to admit for further management.  Allergies: No Known Allergies  Prior to Admission medications   Medication Sig Start Date End Date Taking? Authorizing Provider  amLODipine (NORVASC) 5 MG tablet Take 10 mg by mouth daily. Hold if SBP <100   Yes Historical Provider, MD  benzonatate (TESSALON) 100 MG capsule Take 100 mg by mouth 2 (two) times daily.   Yes Historical Provider, MD  dutasteride (AVODART) 0.5 MG capsule Take 0.5 mg by mouth daily.   Yes Historical Provider, MD  fluticasone (FLONASE) 50 MCG/ACT nasal spray Place 2 sprays into the nose daily.   Yes Historical Provider, MD  imipramine (TOFRANIL) 25 MG tablet Take 25 mg by mouth 2 (two) times daily.   Yes Historical Provider, MD  levothyroxine (SYNTHROID, LEVOTHROID) 50 MCG tablet Take 50 mcg by mouth See admin instructions. Take on Mondays, Tuesdays, Wednesdays, Thursdays, and Fridays.   Yes Historical Provider, MD  levothyroxine (SYNTHROID, LEVOTHROID) 75 MCG tablet Take 75 mcg by mouth See  admin instructions. Take on Saturdays and Sundays only.   Yes Historical Provider, MD  LORazepam (ATIVAN) 1 MG tablet Take 1 mg by mouth 2 (two) times daily.   Yes Historical Provider, MD  omeprazole (PRILOSEC) 20 MG capsule Take 20 mg by mouth 2 (two) times daily.   Yes Historical Provider, MD  oxyCODONE (ROXICODONE) 15 MG immediate release tablet Take 30 mg by mouth every 6 (six) hours as needed. For pain.   Yes Historical Provider, MD  senna (SENOKOT) 8.6 MG TABS Take 2 tablets by mouth at bedtime.   Yes Historical Provider, MD  solifenacin (VESICARE) 10 MG tablet Take 10 mg by mouth daily.   Yes Historical Provider, MD  Vitamin D, Ergocalciferol, (DRISDOL) 50000 UNITS CAPS Take 50,000 Units by mouth every 7 (seven) days. Patient tries to take on Mondays or Tuesdays.   Yes Historical Provider, MD    Past Medical History  Diagnosis Date  . Prostate cancer     hx of prostate ca  . Hypertension   . Thyroid disease     Past Surgical History  Procedure Date  . Back surgery     Social History: Lives with wife reports that he has quit smoking. He does not have any smokeless tobacco history on file. He reports that he does not drink alcohol or use illicit drugs.   Review of Systems: As above in history of present illness Constitutional: Denies fever, chills, diaphoresis,  appetite change and fatigue.  HEENT: Denies photophobia, eye pain, redness, hearing loss, ear pain, congestion, sore throat, rhinorrhea, sneezing, mouth sores, trouble swallowing, neck pain, neck stiffness and tinnitus.   Respiratory: Denies SOB, DOE,  chest tightness,  and wheezing.   Cardiovascular: Denies chest pain, palpitations and leg swelling.  Gastrointestinal: Denies nausea, vomiting, abdominal pain, diarrhea, constipation, blood in stool and abdominal distention.  Genitourinary: Denies dysuria, urgency, frequency, hematuria, flank pain and difficulty urinating.   Neurological: Denies dizziness, seizures,  syncope, weakness, light-headedness, numbness and headaches.     Physical Exam:  Filed Vitals:   12/02/11 1321 12/02/11 1445 12/02/11 1449 12/02/11 1450  BP: 136/65  135/63   Pulse: 83  82   Temp: 102.9 F (39.4 C) 101.8 F (38.8 C)  101.8 F (38.8 C)  TempSrc: Oral   Core (Comment)  Resp: 14  16   SpO2: 96%  100%     Constitutional: Vital signs reviewed.  Patient is in no acute distress ,cooperative with exam. Alert and oriented x3. Speech is is low but not slurred. Eyes: PERRL, EOMI, conjunctivae normal, No scleral icterus.  Neck: Supple, Trachea midline normal ROM, No JVD, mass, thyromegaly, or carotid bruit present.  Cardiovascular: RRR, S1 normal, S2 normal, no MRG, pulses symmetric and intact bilaterally Pulmonary/Chest: CTAB, no wheezes, rales, or rhonchi Abdominal: Soft. Non-tender, non-distended, bowel sounds are normal, no masses, organomegaly, or guarding present.  Ext: no edema and no cyanosis, pulses palpable bilaterally (DP and PT) Hematology: no cervical, inginal, or axillary adenopathy.  Neurological: A&O x3, Strenght is normal and symmetric bilaterally, cranial nerve II-XII are grossly intact, no focal motor deficit, sensory intact to light touch bilaterally.    Labs on Admission:  Results for orders placed during the hospital encounter of 12/02/11 (from the past 48 hour(s))  PROTIME-INR     Status: Normal   Collection Time   12/02/11  1:29 PM      Component Value Range Comment   Prothrombin Time 14.6  11.6 - 15.2 (seconds)    INR 1.12  0.00 - 1.49    APTT     Status: Normal   Collection Time   12/02/11  1:29 PM      Component Value Range Comment   aPTT 28  24 - 37 (seconds)   CBC     Status: Abnormal   Collection Time   12/02/11  1:29 PM      Component Value Range Comment   WBC 15.5 (*) 4.0 - 10.5 (K/uL)    RBC 4.67  4.22 - 5.81 (MIL/uL)    Hemoglobin 13.9  13.0 - 17.0 (g/dL)    HCT 96.0  45.4 - 09.8 (%)    MCV 88.2  78.0 - 100.0 (fL)    MCH 29.8   26.0 - 34.0 (pg)    MCHC 33.7  30.0 - 36.0 (g/dL)    RDW 11.9  14.7 - 82.9 (%)    Platelets 154  150 - 400 (K/uL)   DIFFERENTIAL     Status: Abnormal   Collection Time   12/02/11  1:29 PM      Component Value Range Comment   Neutrophils Relative 92 (*) 43 - 77 (%)    Neutro Abs 14.3 (*) 1.7 - 7.7 (K/uL)    Lymphocytes Relative 2 (*) 12 - 46 (%)    Lymphs Abs 0.4 (*) 0.7 - 4.0 (K/uL)    Monocytes Relative 6  3 - 12 (%)    Monocytes Absolute  0.9  0.1 - 1.0 (K/uL)    Eosinophils Relative 0  0 - 5 (%)    Eosinophils Absolute 0.0  0.0 - 0.7 (K/uL)    Basophils Relative 0  0 - 1 (%)    Basophils Absolute 0.0  0.0 - 0.1 (K/uL)   COMPREHENSIVE METABOLIC PANEL     Status: Abnormal   Collection Time   12/02/11  1:29 PM      Component Value Range Comment   Sodium 135  135 - 145 (mEq/L)    Potassium 3.8  3.5 - 5.1 (mEq/L)    Chloride 102  96 - 112 (mEq/L)    CO2 21  19 - 32 (mEq/L)    Glucose, Bld 129 (*) 70 - 99 (mg/dL)    BUN 16  6 - 23 (mg/dL)    Creatinine, Ser 1.91  0.50 - 1.35 (mg/dL)    Calcium 9.3  8.4 - 10.5 (mg/dL)    Total Protein 6.5  6.0 - 8.3 (g/dL)    Albumin 3.4 (*) 3.5 - 5.2 (g/dL)    AST 21  0 - 37 (U/L)    ALT 11  0 - 53 (U/L)    Alkaline Phosphatase 64  39 - 117 (U/L)    Total Bilirubin 0.6  0.3 - 1.2 (mg/dL)    GFR calc non Af Amer 85 (*) >90 (mL/min)    GFR calc Af Amer >90  >90 (mL/min)   CK TOTAL AND CKMB     Status: Normal   Collection Time   12/02/11  1:29 PM      Component Value Range Comment   Total CK 110  7 - 232 (U/L)    CK, MB 1.5  0.3 - 4.0 (ng/mL)    Relative Index 1.4  0.0 - 2.5    TROPONIN I     Status: Normal   Collection Time   12/02/11  1:29 PM      Component Value Range Comment   Troponin I <0.30  <0.30 (ng/mL)   GLUCOSE, CAPILLARY     Status: Abnormal   Collection Time   12/02/11  1:39 PM      Component Value Range Comment   Glucose-Capillary 129 (*) 70 - 99 (mg/dL)   URINE RAPID DRUG SCREEN (HOSP PERFORMED)     Status: Abnormal    Collection Time   12/02/11  1:55 PM      Component Value Range Comment   Opiates NONE DETECTED  NONE DETECTED     Cocaine NONE DETECTED  NONE DETECTED     Benzodiazepines POSITIVE (*) NONE DETECTED     Amphetamines NONE DETECTED  NONE DETECTED     Tetrahydrocannabinol NONE DETECTED  NONE DETECTED     Barbiturates NONE DETECTED  NONE DETECTED    URINALYSIS, ROUTINE W REFLEX MICROSCOPIC     Status: Normal   Collection Time   12/02/11  1:58 PM      Component Value Range Comment   Color, Urine YELLOW  YELLOW     APPearance CLEAR  CLEAR     Specific Gravity, Urine 1.017  1.005 - 1.030     pH 8.0  5.0 - 8.0     Glucose, UA NEGATIVE  NEGATIVE (mg/dL)    Hgb urine dipstick NEGATIVE  NEGATIVE     Bilirubin Urine NEGATIVE  NEGATIVE     Ketones, ur NEGATIVE  NEGATIVE (mg/dL)    Protein, ur NEGATIVE  NEGATIVE (mg/dL)    Urobilinogen, UA 1.0  0.0 - 1.0 (mg/dL)    Nitrite NEGATIVE  NEGATIVE     Leukocytes, UA NEGATIVE  NEGATIVE  MICROSCOPIC NOT DONE ON URINES WITH NEGATIVE PROTEIN, BLOOD, LEUKOCYTES, NITRITE, OR GLUCOSE <1000 mg/dL.  POCT I-STAT, CHEM 8     Status: Abnormal   Collection Time   12/02/11  2:06 PM      Component Value Range Comment   Sodium 140  135 - 145 (mEq/L)    Potassium 3.7  3.5 - 5.1 (mEq/L)    Chloride 106  96 - 112 (mEq/L)    BUN 15  6 - 23 (mg/dL)    Creatinine, Ser 1.61  0.50 - 1.35 (mg/dL)    Glucose, Bld 096 (*) 70 - 99 (mg/dL)    Calcium, Ion 0.45  1.12 - 1.32 (mmol/L)    TCO2 24  0 - 100 (mmol/L)    Hemoglobin 14.3  13.0 - 17.0 (g/dL)    HCT 40.9  81.1 - 91.4 (%)   LACTIC ACID, PLASMA     Status: Normal   Collection Time   12/02/11  2:48 PM      Component Value Range Comment   Lactic Acid, Venous 1.5  0.5 - 2.2 (mmol/L)     Radiological Exams on Admission: Ct Head Wo Contrast  12/02/2011  *RADIOLOGY REPORT*  Clinical Data: Code stroke.  Unsteady gait.  Confusion.  CT HEAD WITHOUT CONTRAST  Technique:  Contiguous axial images were obtained from the base of  the skull through the vertex without contrast.  Comparison: None.  Findings: The brain shows generalized atrophy.  There are chronic small vessel changes affecting the hemispheric white matter.  No sign of acute infarction, mass lesion, hemorrhage, hydrocephalus or extra-axial collection.  The calvarium is unremarkable.  Sinuses, middle ears and mastoids are clear.  IMPRESSION:  Brain atrophy.  No acute or focal finding.  Critical Value/emergent results were called by telephone at the time of interpretation on 12/02/2011  at 1322 hours  to  Dr. Jeraldine Loots, who verbally acknowledged these results.  Original Report Authenticated By: Thomasenia Sales, M.D.   Dg Chest Port 1 View  12/02/2011  *RADIOLOGY REPORT*  Clinical Data: Altered mental status.  Slurred speech.  Difficulty walking.  PORTABLE CHEST - 1 VIEW  Comparison: 10/21/2010 and chest CT dated 10/25/2010  Findings: The markings are slightly accentuated in both bases, but improved since the prior studies.  The infiltrate seen in the right upper lobe on the prior CT scan has resolved.  There is mild cardiomegaly with mild pulmonary vascular prominence.  IMPRESSION: Improved aeration at the bases and in the right upper lobe since the prior study.  Original Report Authenticated By: Gwynn Burly, M.D.    Assessment/Plan Principal Problem:  *Altered mental status: Unclear etiology, possibly secondary to toxic metabolic encephalopathy precipitated by viral syndrome Active Problems:  Fever: Urinalysis unremarkable, chest x-ray showed improvement. White blood count is elevated  Chronic Cough : History of empyema  status post drainage  HTN (hypertension: on amlodipine  Hypothyroidism, on Synthroid Anxiety: on Ativan Plan: Admit to telemetry Given fever and leukocytosis I would continue with vancomycin and Zosyn for now with low threshold to discontinue if no source of infection identified . Blood cultures sent from the ED. Mental status apparently  improved, neurology, and did no further neurological interventions at this time. If no improvement, consider MRI brain.Check TSH. Continue with home medication for other  chronic medical problems . Full code DVT prophylaxis with subcutaneous Lovenox Voice  message left for Dr. Earl Gala to assume patient's care  in the a.m.   Time Spent on Admission: Approximately  45 minutes  Glorian Mcdonell 12/02/2011, 4:12 PM

## 2011-12-02 NOTE — Code Documentation (Signed)
Code stroke called at 1239 Patient arrival 1303 edp exam 1303 Stroke team arrival 1246 lsn 0800 Pt arrival in CT 1305 Phlebotomist arrival 1303 CT read by neurologist  1310  LSN by wife this morning at 0800 before she left the house. Per EMS, neighbors noticed pt with difficulty getting into vehicle and called EMS. Upon arrival to hospital pt. With temp of 102.9. NIH 5, weakness bilat, and unable to state month/confusion. Code stroke cancelled. Eval deferred to EDP.

## 2011-12-02 NOTE — Progress Notes (Addendum)
ANTIBIOTIC CONSULT NOTE - INITIAL  Pharmacy Consult for Vancomycin Indication: Empiric coverage in setting of fever of unknown origin  No Known Allergies  Patient Measurements: Height: 6\' 1"  (185.4 cm) Weight: 210 lb (95.255 kg) IBW/kg (Calculated) : 79.9    Vital Signs: Temp: 98.8 F (37.1 C) (04/22 1742) Temp src: Oral (04/22 1742) BP: 118/67 mmHg (04/22 1742) Pulse Rate: 66  (04/22 1742) Intake/Output from previous day:   Intake/Output from this shift:    Labs:  Basename 12/02/11 1406 12/02/11 1329  WBC -- 15.5*  HGB 14.3 13.9  PLT -- 154  LABCREA -- --  CREATININE 0.90 0.83   Estimated Creatinine Clearance: 82.6 ml/min (by C-G formula based on Cr of 0.9). No results found for this basename: VANCOTROUGH:2,VANCOPEAK:2,VANCORANDOM:2,GENTTROUGH:2,GENTPEAK:2,GENTRANDOM:2,TOBRATROUGH:2,TOBRAPEAK:2,TOBRARND:2,AMIKACINPEAK:2,AMIKACINTROU:2,AMIKACIN:2, in the last 72 hours   Microbiology: No results found for this or any previous visit (from the past 720 hour(s)).  Medical History: Past Medical History  Diagnosis Date  . Prostate cancer     hx of prostate ca  . Hypertension   . Thyroid disease     Assessment: 76 y.o. M to start Vancomycin in addition to Zosyn for empiric coverage in the setting of fevers of unknown origin. Wt: 95.2 kg, SCr 0.9, CrCl~60-70 ml/min.   Goal of Therapy:  Vancomycin trough level 15-20 mcg/ml  Plan:  1. Vancomycin 1g IV every 12 hours 2. Will adjust Zosyn to 3.375g IV every 8 hours 3. Will continue to follow renal function, culture results, LOT, and antibiotic de-escalation plans   Georgina Pillion, PharmD, BCPS Clinical Pharmacist Pager: [phone] 12/02/2011 5:57 PM

## 2011-12-03 ENCOUNTER — Inpatient Hospital Stay (HOSPITAL_COMMUNITY): Payer: Medicare Other

## 2011-12-03 DIAGNOSIS — R4182 Altered mental status, unspecified: Secondary | ICD-10-CM

## 2011-12-03 LAB — BASIC METABOLIC PANEL
CO2: 25 mEq/L (ref 19–32)
Chloride: 108 mEq/L (ref 96–112)
Potassium: 3.4 mEq/L — ABNORMAL LOW (ref 3.5–5.1)
Sodium: 140 mEq/L (ref 135–145)

## 2011-12-03 LAB — CBC
MCV: 88.2 fL (ref 78.0–100.0)
Platelets: 155 10*3/uL (ref 150–400)
RBC: 4.42 MIL/uL (ref 4.22–5.81)
WBC: 13.9 10*3/uL — ABNORMAL HIGH (ref 4.0–10.5)

## 2011-12-03 MED ORDER — MAGNESIUM HYDROXIDE 400 MG/5ML PO SUSP
30.0000 mL | ORAL | Status: DC | PRN
  Administered 2011-12-03 – 2011-12-04 (×2): 30 mL via ORAL
  Filled 2011-12-03 (×2): qty 30

## 2011-12-03 NOTE — Progress Notes (Signed)
Foley catheter removed at 10:45 12/03/11 per criteria.  No charted insertion of foley catheter.  Will continue to monitor output.  Nolon Nations

## 2011-12-03 NOTE — Progress Notes (Signed)
12-03-11 UR completed. Ronny Flurry RN BSN

## 2011-12-03 NOTE — Progress Notes (Signed)
Subjective: Patient admitted secondary to mental status change with confusion, fever and leukocytosis with no source of infection identified at initial evaluation. He does have a h/o prostate cancer, hypothyroidism, low vitamin D and h/o chronic cough.  Mr. Bluestein has no clear recollection of yesterday's events leading to his hospitalization. This AM he feels OK with no focal complaints. He has chronic back pain but denies any acceleration of pain over the past several days. He has had no dysuria, no productive cough, no abdominal pain and odes not recall feeling febrile.   Objective: Lab:  Lab Results  Component Value Date   WBC 13.9* 12/03/2011   HGB 13.0 12/03/2011   HCT 39.0 12/03/2011   MCV 88.2 12/03/2011   PLT 155 12/03/2011   BMET    Component Value Date/Time   NA 140 12/02/2011 1406   K 3.7 12/02/2011 1406   CL 106 12/02/2011 1406   CO2 21 12/02/2011 1329   GLUCOSE 131* 12/02/2011 1406   BUN 15 12/02/2011 1406   CREATININE 0.94 12/02/2011 1853   CALCIUM 9.3 12/02/2011 1329   GFRNONAA 81* 12/02/2011 1853   GFRAA >90 12/02/2011 1853      Imaging:no new imaging  Physical Exam: Filed Vitals:   12/03/11 0400  BP: 128/65  Pulse: 78  Temp: 99.1 F (37.3 C)  Resp: 18  WNWD AA man in no distress who is cooperative with exam.  HEENT - C&S clear Neck - supple Nodes - no cervical nodes Cor - 2+ radial pulse, RRR, no murmurs Pulm - normal respirations w/o increased WOB. Feint crackles at left base but no wheezing Abd - BS+, soft, no guarding or rebound Derm - no rash on face, neck arms, torso. Neuro - A&O x 3, CN II-XII grossly intact. MAE.     Assessment/Plan: 1. ID - patient with possible delerium 4/22 with temp to 102 and leukocytosis to 17.8. He is doing better with temp down to 99.1. AM lab is pending. Day #2 Zosyn/vanco. Suspect bacterial infection with left shift on WBC. Source of infection not identified.  Plan - continue zoysn but d/c vancomycin           Repeat CXR  now that he is hydrated - in setting of infection and mildly abnormal breath sounds.  2. Hypothyroid- Lab Results  Component Value Date   TSH 0.325* 10/20/2010  ' Plan - add TSH to blood in lab  3. Chronic back pain - stable on present home regimen  4. Cardiac - no evidence of cardiac disease  Plan - d/c/ telemetry   Illene Regulus 12/03/2011, 6:50 AM

## 2011-12-04 LAB — CBC
HCT: 39.3 % (ref 39.0–52.0)
Hemoglobin: 13.4 g/dL (ref 13.0–17.0)
MCV: 88.3 fL (ref 78.0–100.0)
RBC: 4.45 MIL/uL (ref 4.22–5.81)
WBC: 10.2 10*3/uL (ref 4.0–10.5)

## 2011-12-04 LAB — DIFFERENTIAL
Eosinophils Relative: 1 % (ref 0–5)
Lymphocytes Relative: 15 % (ref 12–46)
Lymphs Abs: 1.5 10*3/uL (ref 0.7–4.0)
Monocytes Absolute: 0.9 10*3/uL (ref 0.1–1.0)

## 2011-12-04 MED ORDER — CEFUROXIME AXETIL 250 MG PO TABS: 250.0000 mg | ORAL_TABLET | Freq: Two times a day (BID) | ORAL | Status: AC

## 2011-12-04 MED ORDER — MAGNESIUM HYDROXIDE 400 MG/5ML PO SUSP: 30.0000 mL | ORAL | Status: AC | PRN

## 2011-12-04 NOTE — Discharge Summary (Signed)
NAMEMEET, [place] NO.:  [id number]  MEDICAL RECORD NO.:  [id number]  LOCATION:  3005                         FACILITY:  [facility]  PHYSICIAN:  Rosalyn Gess. Illana Nolting, MD  DATE OF BIRTH:  28-Mar-1936  DATE OF ADMISSION:  12/02/2011 DATE OF DISCHARGE:  12/04/2011                              DISCHARGE SUMMARY   ADMITTING DIAGNOSES: 1. Acute mental status change. 2. Leukocytosis at admission. 3. Fever at admission. 4. History of chronic cough. 5. History of hypertension. 6. History of hypothyroid disease.  DISCHARGE DIAGNOSES: 1. Fever and leukocytosis of unknown origin. 2. Fever, resolved. 3. Chronic cough. 4. Hypertension. 5. Hypothyroid disease.  CONSULTANTS:  None.  PROCEDURES: 1. CT scan of the head without contrast on the day of admission, which     was unremarkable with no acute abnormalities noted. 2. A chest x-ray on the day of admission, which showed improved     aeration of the bases in his right upper lobe since his previous     study on March, 15, 2012. 3. PA and lateral chest x-ray December 03, 2011, which remained clear     with no acute cardiopulmonary process.  HISTORY OF PRESENT ILLNESS:  Mr. Oehlert is a delightful 76 year old African American gentleman who had a significant change in mental status, being confused and unable to get into his car.  He was noted to have slurred speech.  Neighbors call EMS who brought the patient to hospital.  He was called a code stroke.  CT of the head showed no acute abnormalities.  The patient was seen emergently by Neurology who felt that the patient did not have evidence of a stroke and code stroke was cancelled.  In the emergency department, the patient was noted to have a temperature of 102 Fahrenheit.  He has a chronic cough and had no recent worsening.  He denied any chills or fever at home.  The patient had no signs of urinary tract infection with no dysuria or flank pain.  He had no GI symptoms.   Specifically, no diarrhea or abdominal pain.  Because of fever, delirium, and leukocytosis, the patient was admitted and started on vancomycin and Zosyn.  Please see the H and P for past medical history, family history, and social history.  HOSPITAL COURSE:  Fever with leukocytosis.  The patient probably presented with delirium with high fever.  He had an elevated white count at the time of admission at 15,500, which rose to a maximum of 17,800 later in the day on the day of admission.  The patient did respond to therapy with defervesce and stable hemodynamics.  His white count continued to drop to 13,900 on the December 03, 2011.  On the day of discharge, white count was 10,200 with a normal differential. Differential at admission was actually significant for left shift with 92% segs, 2% lymphs, 6% monocytes.  The patient's x-rays were normal. The patient did report that on the December 03, 2011, he did develop a productive cough suggesting he may have had a bacterial bronchitis.  The rest of the studies and examinations were normal.  With the patient having defervesced with his white count having  returned to normal, with his mental status being cleared with no other active complaints, he is stable and ready for discharge home to complete an outpatient course of antibiotics.  DISCHARGE EXAMINATION:  VITAL SIGNS:  Temperature was 99.5, blood pressure 147/73, pulse was 81, respirations 18, O2 sats 94% on room air. GENERAL:  This is a well-nourished, heavyset, overweight African American gentleman in no acute distress. HEENT:  Conjunctivae and sclerae were clear. NECK:  Supple. NODES:  No adenopathy was appreciated in the submental, cervical, or supraclavicular regions. CHEST:  The patient had no CVA tenderness.  No deformities. PULMONARY:  The patient had good breath sounds, with no rales, no wheezes, no rhonchi.  No increased work of breathing. CARDIOVASCULAR:  2+ radial pulses.   Precordium was quiet.  He had a regular rate and rhythm. ABDOMEN:  Protuberant, soft, with positive bowel sounds, and no tenderness.  FINAL LABORATORY:  On the day of discharge, white count was 10,200, hemoglobin was 13.4 g, platelet count 152,000.  Final chemistries from December 03, 2011, with a sodium of 140, potassium 3.4, chloride 108, CO2 of 25, BUN is 17, creatinine 0.83.  Final chest x-ray is noted.  DISPOSITION:  The patient is to be discharged home.  He will resume all of his home medications.  He will continue on Ceftin 250 mg b.i.d. for an additional 5 days of therapy.  FOLLOWUP:  The patient is advised to call [facility], for a followup appointment in 7-10 days.  However, if he is feeling particularly well and wants to wait for Dr. [name] to return from vacation in 2 weeks, that would be acceptable as long as he continues to feel well.  The patient's condition at time of discharge dictation is stable and improved.     Rosalyn Gess Nanako Stopher, MD     MEN/MEDQ  D:  12/04/2011  T:  12/04/2011  Job:  [id number]

## 2011-12-08 LAB — CULTURE, BLOOD (ROUTINE X 2)
Culture  Setup Time: 201304222159
Culture: NO GROWTH

## 2012-10-02 ENCOUNTER — Other Ambulatory Visit: Payer: Self-pay | Admitting: Neurology

## 2012-10-02 DIAGNOSIS — G629 Polyneuropathy, unspecified: Secondary | ICD-10-CM

## 2012-10-06 ENCOUNTER — Ambulatory Visit
Admission: RE | Admit: 2012-10-06 | Discharge: 2012-10-06 | Disposition: A | Discharge status: Home / Self Care | Payer: Medicare Other | Source: Ambulatory Visit | Attending: Neurology | Admitting: Neurology

## 2012-10-06 VITALS — BP 168/62 | HR 60

## 2012-10-06 DIAGNOSIS — G629 Polyneuropathy, unspecified: Secondary | ICD-10-CM

## 2012-10-06 NOTE — Progress Notes (Signed)
Blood drawn for serum for labs for LP; from left [facility] space without difficulty; site unremarkable.  dd

## 2012-10-06 NOTE — Progress Notes (Signed)
Discharge instructions explained. Pt states understanding

## 2012-10-09 LAB — CSF CULTURE W GRAM STAIN

## 2012-10-14 LAB — CSF PANEL II
Glucose, CSF: 67 mg/dL (ref 43–76)
Myelin Basic Protein: <2 mcg/L (ref 0.0–4.0)
RBC Count, CSF: 2 cu mm — ABNORMAL HIGH
Total Protein, CSF: 47 mg/dL — ABNORMAL HIGH (ref 15–45)
Tube #: 4
WBC, CSF: 1 cu mm (ref 0–5)

## 2012-11-05 NOTE — Progress Notes (Signed)
Quick Note:  So far, CSF studies are unremarkable. Fungal cultures negative. Protein slightly above normal at 47, but in isolation a nonspecific finding. ______

## 2012-11-24 ENCOUNTER — Ambulatory Visit: Payer: Self-pay | Admitting: Neurology

## 2012-12-01 ENCOUNTER — Ambulatory Visit: Payer: Self-pay | Admitting: Neurology

## 2012-12-22 ENCOUNTER — Other Ambulatory Visit: Payer: Self-pay | Admitting: Neurosurgery

## 2012-12-22 DIAGNOSIS — R1310 Dysphagia, unspecified: Secondary | ICD-10-CM

## 2012-12-23 ENCOUNTER — Other Ambulatory Visit: Payer: Self-pay | Admitting: Neurosurgery

## 2012-12-23 DIAGNOSIS — M549 Dorsalgia, unspecified: Secondary | ICD-10-CM

## 2012-12-30 ENCOUNTER — Ambulatory Visit
Admission: RE | Admit: 2012-12-30 | Discharge: 2012-12-30 | Disposition: A | Discharge status: Home / Self Care | Payer: Medicare Other | Source: Ambulatory Visit | Attending: Neurosurgery | Admitting: Neurosurgery

## 2012-12-30 VITALS — BP 157/60 | HR 60

## 2012-12-30 DIAGNOSIS — M549 Dorsalgia, unspecified: Secondary | ICD-10-CM

## 2012-12-30 MED ORDER — MEPERIDINE HCL 100 MG/ML IJ SOLN
75.0000 mg | Freq: Once | INTRAMUSCULAR | Status: AC
  Administered 2012-12-30: 75 mg via INTRAMUSCULAR

## 2012-12-30 MED ORDER — IOHEXOL 180 MG/ML  SOLN
20.0000 mL | Freq: Once | INTRAMUSCULAR | Status: AC | PRN
  Administered 2012-12-30: 20 mL via INTRATHECAL

## 2012-12-30 MED ORDER — ONDANSETRON HCL 4 MG/2ML IJ SOLN
4.0000 mg | Freq: Once | INTRAMUSCULAR | Status: AC
  Administered 2012-12-30: 4 mg via INTRAMUSCULAR

## 2012-12-30 MED ORDER — DIAZEPAM 5 MG PO TABS
5.0000 mg | ORAL_TABLET | Freq: Once | ORAL | Status: AC
  Administered 2012-12-30: 5 mg via ORAL

## 2013-01-06 ENCOUNTER — Ambulatory Visit
Admission: RE | Admit: 2013-01-06 | Discharge: 2013-01-06 | Disposition: A | Discharge status: Home / Self Care | Payer: Medicare Other | Source: Ambulatory Visit | Attending: Neurosurgery | Admitting: Neurosurgery

## 2013-01-06 DIAGNOSIS — R1310 Dysphagia, unspecified: Secondary | ICD-10-CM

## 2013-01-14 ENCOUNTER — Other Ambulatory Visit (HOSPITAL_COMMUNITY): Payer: Self-pay | Admitting: Internal Medicine

## 2013-01-14 DIAGNOSIS — R1310 Dysphagia, unspecified: Secondary | ICD-10-CM

## 2013-01-14 DIAGNOSIS — T17900D Unspecified foreign body in respiratory tract, part unspecified causing asphyxiation, subsequent encounter: Secondary | ICD-10-CM

## 2013-01-19 ENCOUNTER — Ambulatory Visit (HOSPITAL_COMMUNITY)
Admission: RE | Admit: 2013-01-19 | Discharge: 2013-01-19 | Disposition: A | Discharge status: Home / Self Care | Payer: Medicare Other | Source: Ambulatory Visit | Attending: Internal Medicine | Admitting: Internal Medicine

## 2013-01-19 ENCOUNTER — Other Ambulatory Visit: Payer: Self-pay | Admitting: Neurosurgery

## 2013-01-19 DIAGNOSIS — T17900D Unspecified foreign body in respiratory tract, part unspecified causing asphyxiation, subsequent encounter: Secondary | ICD-10-CM

## 2013-01-19 DIAGNOSIS — R1310 Dysphagia, unspecified: Secondary | ICD-10-CM

## 2013-01-19 DIAGNOSIS — R1312 Dysphagia, oropharyngeal phase: Secondary | ICD-10-CM | POA: Insufficient documentation

## 2013-01-19 DIAGNOSIS — M549 Dorsalgia, unspecified: Secondary | ICD-10-CM

## 2013-01-19 NOTE — Procedures (Signed)
Objective Swallowing Evaluation: Modified Barium Swallowing Study  Patient Details  Name: [name] MRN: [id number] Date of Birth: 09-Jul-1936  Today's Date: 01/19/2013 Time: 1100-1130 SLP Time Calculation (min): 30 min  Past Medical History:  Past Medical History  Diagnosis Date  . Prostate cancer     hx of prostate ca  . Hypertension   . Thyroid disease    Past Surgical History:  Past Surgical History  Procedure Laterality Date  . Back surgery     HPI:  Pt is a 76 year old male arriving for an outpatient MBS due to finding on recent esophagram of silent aspiration. Pt also c/o globus, gravelly/wet vocal quality, GERD and throat clearing. He has a history of removal of cervical bone spurs via anterior route approx 3 years ago resulting in acute dysphagia that has since resolved. He denies pna. He plans for further lumbar surgery in the future.      Assessment / Plan / Recommendation Clinical Impression  Dysphagia Diagnosis: Moderate pharyngeal phase dysphagia Clinical impression: Pt presents with a mild oropharyngeal dysphagia with silent aspiration of liquids during and after the swallow. The pt has the appearance of a small osteophyte at C3/4 that gently impedes esophageal deflection, making laryngeal closure incomplete during the swallow and trapping residuals in valleculae. The pt penetrates to the cords during the swallow. Penetrate and residue then fall to and past cords post swallow without sensation from pt. There is also mild weakness throughout base of tongue and hyolaryngeal complex. Question if tight UES could be impacting mobility as well. When the pt is instructed to swallow hard, he is able to completely deflect epiglottis, increase closure of vestibule, open UES and transit bolus with less residuals.  A throat clear and second swallow expels penetrate. Given pts good response to effortful swallow, suspect strengthening exercises will help pt compensate for deficits  and prepare for upcoming surgey. Pt will be at high risk for aspiration post back surgery if he is bed bound and receiving pain medication. Discussed with pt. Hopeful that he will pursue outpatient SLP therapy.     Treatment Recommendation  Defer treatment plan to SLP at (Comment) (outpatient)    Diet Recommendation Regular;Thin liquid   Liquid Administration via: Cup;No straw Medication Administration: Whole meds with puree Supervision: Patient able to self feed Compensations: Slow rate;Small sips/bites;Multiple dry swallows after each bite/sip;Clear throat intermittently;Hard cough after swallow;Effortful swallow Postural Changes and/or Swallow Maneuvers: Seated upright 90 degrees;Upright 30-60 min after meal    Other  Recommendations Oral Care Recommendations: Patient independent with oral care   Follow Up Recommendations  Outpatient SLP    Frequency and Duration        Pertinent Vitals/Pain NA    SLP Swallow Goals     General HPI: Pt is a 77 year old male arriving for an outpatient MBS due to finding on recent esophagram of silent aspiration. Pt also c/o globus, gravelly/wet vocal quality, GERD and throat clearing. He has a history of removal of cervical bone spurs via anterior route approx 3 years ago with resulting, acute dysphagia that has since resolved. He denies pna. He plans for further lumbar surgery in the future.  Type of Study: Modified Barium Swallowing Study Reason for Referral: Objectively evaluate swallowing function Previous Swallow Assessment: esophagram - silent aspiration, no definite stricture, study discontinued.  Diet Prior to this Study: Regular;Thin liquids Temperature Spikes Noted: N/A Respiratory Status: Room air History of Recent Intubation: No Behavior/Cognition: Alert;Cooperative;Pleasant mood Oral Cavity -  Dentition: Adequate natural dentition Oral Motor / Sensory Function: Within functional limits Self-Feeding Abilities: Able to feed  self Patient Positioning: Upright in chair Baseline Vocal Quality: Hoarse;Wet Volitional Cough: Strong Volitional Swallow: Able to elicit Anatomy: Other (Comment) (appearance of smaller osteophyte at C3/4) Pharyngeal Secretions: Not observed secondary MBS    Reason for Referral Objectively evaluate swallowing function   Oral Phase Oral Preparation/Oral Phase Oral Phase: WFL   Pharyngeal Phase Pharyngeal Phase Pharyngeal Phase: Impaired Pharyngeal - Nectar Pharyngeal - Nectar Cup: Reduced epiglottic inversion;Reduced airway/laryngeal closure;Reduced tongue base retraction;Reduced laryngeal elevation;Reduced anterior laryngeal mobility;Penetration/Aspiration during swallow;Penetration/Aspiration after swallow;Trace aspiration;Pharyngeal residue - valleculae Penetration/Aspiration details (nectar cup): Material enters airway, remains ABOVE vocal cords and not ejected out;Material enters airway, CONTACTS cords and not ejected out;Material enters airway, passes BELOW cords and not ejected out despite cough attempt by patient Pharyngeal - Thin Pharyngeal - Thin Cup: Reduced epiglottic inversion;Reduced airway/laryngeal closure;Reduced tongue base retraction;Reduced laryngeal elevation;Reduced anterior laryngeal mobility;Penetration/Aspiration during swallow;Penetration/Aspiration after swallow;Trace aspiration;Pharyngeal residue - valleculae Penetration/Aspiration details (thin cup): Material enters airway, remains ABOVE vocal cords and not ejected out;Material enters airway, CONTACTS cords and not ejected out;Material enters airway, passes BELOW cords without attempt by patient to eject out (silent aspiration) Pharyngeal - Solids Pharyngeal - Puree: Reduced epiglottic inversion;Reduced airway/laryngeal closure;Reduced tongue base retraction;Reduced laryngeal elevation;Reduced anterior laryngeal mobility;Pharyngeal residue - valleculae Penetration/Aspiration details (puree): Material does not enter  airway Pharyngeal - Regular: Reduced epiglottic inversion;Reduced airway/laryngeal closure;Reduced tongue base retraction;Reduced laryngeal elevation;Reduced anterior laryngeal mobility;Pharyngeal residue - valleculae Penetration/Aspiration details (regular): Material does not enter airway  Cervical Esophageal Phase    GO    Cervical Esophageal Phase Cervical Esophageal Phase: Impaired Cervical Esophageal Phase - Comment Cervical Esophageal Comment: Tight UES, trace residuals. Esophageal sweep shows mild distal stasis. Pill passed to stomach.     Functional Assessment Tool Used: clinical judgement Functional Limitations: Swallowing Swallow Current Status ([id number]): At least 20 percent but less than 40 percent impaired, limited or restricted Swallow Goal Status [phone]): At least 20 percent but less than 40 percent impaired, limited or restricted Swallow Discharge Status [phone]): At least 20 percent but less than 40 percent impaired, limited or restricted   [facility], MA CCC-SLP [phone]  Claudine Mouton 01/19/2013, 12:20 PM

## 2013-01-25 ENCOUNTER — Other Ambulatory Visit: Payer: Self-pay | Admitting: Neurosurgery

## 2013-01-25 ENCOUNTER — Ambulatory Visit
Admission: RE | Admit: 2013-01-25 | Discharge: 2013-01-25 | Disposition: A | Discharge status: Home / Self Care | Payer: Medicare Other | Source: Ambulatory Visit | Attending: Neurosurgery | Admitting: Neurosurgery

## 2013-01-25 VITALS — BP 166/60 | HR 48

## 2013-01-25 DIAGNOSIS — M549 Dorsalgia, unspecified: Secondary | ICD-10-CM

## 2013-01-25 MED ORDER — IOHEXOL 180 MG/ML  SOLN
1.0000 mL | Freq: Once | INTRAMUSCULAR | Status: AC | PRN
  Administered 2013-01-25: 1 mL via EPIDURAL

## 2013-01-25 MED ORDER — METHYLPREDNISOLONE ACETATE 40 MG/ML INJ SUSP (RADIOLOG
120.0000 mg | Freq: Once | INTRAMUSCULAR | Status: AC
  Administered 2013-01-25: 120 mg via EPIDURAL

## 2013-02-11 ENCOUNTER — Ambulatory Visit: Payer: Medicare Other

## 2013-02-21 ENCOUNTER — Other Ambulatory Visit: Payer: Self-pay | Admitting: Neurosurgery

## 2013-02-21 DIAGNOSIS — M545 Low back pain: Secondary | ICD-10-CM

## 2013-02-24 ENCOUNTER — Ambulatory Visit
Admission: RE | Admit: 2013-02-24 | Discharge: 2013-02-24 | Disposition: A | Discharge status: Home / Self Care | Payer: Medicare Other | Source: Ambulatory Visit | Attending: Neurosurgery | Admitting: Neurosurgery

## 2013-02-24 ENCOUNTER — Other Ambulatory Visit: Payer: Self-pay | Admitting: Neurosurgery

## 2013-02-24 DIAGNOSIS — M545 Low back pain: Secondary | ICD-10-CM

## 2013-02-24 MED ORDER — IOHEXOL 180 MG/ML  SOLN
1.0000 mL | Freq: Once | INTRAMUSCULAR | Status: AC | PRN
  Administered 2013-02-24: 1 mL via EPIDURAL

## 2013-02-24 MED ORDER — METHYLPREDNISOLONE ACETATE 40 MG/ML INJ SUSP (RADIOLOG
120.0000 mg | Freq: Once | INTRAMUSCULAR | Status: AC
  Administered 2013-02-24: 120 mg via EPIDURAL

## 2013-04-02 ENCOUNTER — Ambulatory Visit: Payer: Medicare Other | Attending: Internal Medicine

## 2013-04-05 ENCOUNTER — Other Ambulatory Visit: Payer: Self-pay | Admitting: Neurosurgery

## 2013-04-05 DIAGNOSIS — M5136 Other intervertebral disc degeneration, lumbar region: Secondary | ICD-10-CM

## 2013-04-07 ENCOUNTER — Other Ambulatory Visit: Payer: Self-pay | Admitting: Neurosurgery

## 2013-04-09 ENCOUNTER — Ambulatory Visit
Admission: RE | Admit: 2013-04-09 | Discharge: 2013-04-09 | Disposition: A | Discharge status: Home / Self Care | Payer: Medicare Other | Source: Ambulatory Visit | Attending: Neurosurgery | Admitting: Neurosurgery

## 2013-04-09 ENCOUNTER — Other Ambulatory Visit: Payer: Self-pay | Admitting: Neurosurgery

## 2013-04-09 VITALS — BP 175/66 | HR 58

## 2013-04-09 DIAGNOSIS — M5136 Other intervertebral disc degeneration, lumbar region: Secondary | ICD-10-CM

## 2013-04-09 MED ORDER — IOHEXOL 180 MG/ML  SOLN
1.0000 mL | Freq: Once | INTRAMUSCULAR | Status: AC | PRN
  Administered 2013-04-09: 1 mL via EPIDURAL

## 2013-04-09 MED ORDER — METHYLPREDNISOLONE ACETATE 40 MG/ML INJ SUSP (RADIOLOG
120.0000 mg | Freq: Once | INTRAMUSCULAR | Status: AC
  Administered 2013-04-09: 120 mg via EPIDURAL

## 2013-04-15 ENCOUNTER — Ambulatory Visit: Payer: Medicare Other

## 2013-04-23 ENCOUNTER — Ambulatory Visit: Payer: Medicare Other

## 2013-05-05 ENCOUNTER — Encounter (HOSPITAL_COMMUNITY): Payer: Self-pay | Admitting: Pharmacy Technician

## 2013-05-06 ENCOUNTER — Other Ambulatory Visit: Payer: Self-pay | Admitting: Neurosurgery

## 2013-05-06 DIAGNOSIS — M5136 Other intervertebral disc degeneration, lumbar region: Secondary | ICD-10-CM

## 2013-05-07 NOTE — Pre-Procedure Instructions (Addendum)
[name]  05/07/2013   Your procedure is scheduled on: 05/18/13  Report to [facility] short stay admitting at 530AM.  Call this number if you have problems the morning of surgery: [phone]   Remember:   Do not eat food or drink liquids after midnight.   Take these medicines the morning of surgery with A SIP OF WATER: amlodipine,metoprolol, cymbalta, avodart, imipramine,synthroid        STOP ibuprofen 05/13/13   Do not wear jewelry, make-up or nail polish.  Do not wear lotions, powders, or perfumes. You may wear deodorant.  Do not shave 48 hours prior to surgery. Men may shave face and neck.  Do not bring valuables to the hospital.  [facility] is not responsible                  for any belongings or valuables.               Contacts, dentures or bridgework may not be worn into surgery.  Leave suitcase in the car. After surgery it may be brought to your room.  For patients admitted to the hospital, discharge time is determined by your                treatment team.               Patients discharged the day of surgery will not be allowed to drive  home.  Name and phone number of your driver:   Special Instructions: Shower using CHG 2 nights before surgery and the night before surgery.  If you shower the day of surgery use CHG.  Use special wash - you have one bottle of CHG for all showers.  You should use approximately 1/3 of the bottle for each shower.   Please read over the following fact sheets that you were given: Pain Booklet, Coughing and Deep Breathing, Blood Transfusion Information, MRSA Information and Surgical Site Infection Prevention

## 2013-05-10 ENCOUNTER — Encounter (HOSPITAL_COMMUNITY): Payer: Self-pay

## 2013-05-10 ENCOUNTER — Ambulatory Visit (HOSPITAL_COMMUNITY)
Admission: RE | Admit: 2013-05-10 | Discharge: 2013-05-10 | Disposition: A | Discharge status: Home / Self Care | Payer: Medicare Other | Source: Ambulatory Visit | Attending: Anesthesiology | Admitting: Anesthesiology

## 2013-05-10 ENCOUNTER — Other Ambulatory Visit (HOSPITAL_COMMUNITY): Payer: Self-pay | Admitting: *Deleted

## 2013-05-10 ENCOUNTER — Encounter (HOSPITAL_COMMUNITY)
Admission: RE | Admit: 2013-05-10 | Discharge: 2013-05-10 | Disposition: A | Discharge status: Home / Self Care | Payer: Medicare Other | Source: Ambulatory Visit | Attending: Neurosurgery | Admitting: Neurosurgery

## 2013-05-10 DIAGNOSIS — Z01818 Encounter for other preprocedural examination: Secondary | ICD-10-CM | POA: Insufficient documentation

## 2013-05-10 DIAGNOSIS — Z01812 Encounter for preprocedural laboratory examination: Secondary | ICD-10-CM | POA: Insufficient documentation

## 2013-05-10 HISTORY — DX: Other complications of anesthesia, initial encounter: T88.59XA

## 2013-05-10 HISTORY — DX: Hypothyroidism, unspecified: E03.9

## 2013-05-10 HISTORY — DX: Cough: R05

## 2013-05-10 HISTORY — DX: Unspecified osteoarthritis, unspecified site: M19.90

## 2013-05-10 HISTORY — DX: Adverse effect of unspecified anesthetic, initial encounter: T41.45XA

## 2013-05-10 HISTORY — DX: Gastro-esophageal reflux disease without esophagitis: K21.9

## 2013-05-10 HISTORY — DX: Cough, unspecified: R05.9

## 2013-05-10 LAB — TYPE AND SCREEN
ABO/RH(D): A POS
Antibody Screen: NEGATIVE

## 2013-05-10 LAB — BASIC METABOLIC PANEL
BUN: 16 mg/dL (ref 6–23)
CO2: 27 mEq/L (ref 19–32)
Calcium: 9 mg/dL (ref 8.4–10.5)
Glucose, Bld: 115 mg/dL — ABNORMAL HIGH (ref 70–99)
Potassium: 3.7 mEq/L (ref 3.5–5.1)
Sodium: 139 mEq/L (ref 135–145)

## 2013-05-10 LAB — CBC
MCV: 89.8 fL (ref 78.0–100.0)
Platelets: 165 10*3/uL (ref 150–400)
RDW: 14.8 % (ref 11.5–15.5)
WBC: 7.9 10*3/uL (ref 4.0–10.5)

## 2013-05-10 NOTE — Progress Notes (Signed)
Anesthesia pa notified of intubation difficulty hx

## 2013-05-11 ENCOUNTER — Encounter (HOSPITAL_COMMUNITY): Payer: Self-pay

## 2013-05-11 NOTE — Progress Notes (Signed)
Anesthesia Chart Review:  Patient is a 77 year old male scheduled for L4-5, L5-S1 PLIF on 05/18/13 by Dr. Jeral Fruit.  History includes former smoker, prostate cancer, HTN, hypothyroidism, GERD, right empyema s/p decortication '08, removal of C2-T1 osteophyte '10, DIFFICULT INTUBATION.  I obtained anesthesia records from 09/29/06 and 02/24/09. Limited neck mobility was thought to be a contributing factor to difficult intubation.  A glidescope was utilized in both cases. Recommendations made for induction with short acting agent and alternative techniques readily available.  Old records on chart for his anesthesiologist to review on the day of surgery.  EKG on 05/10/13 showed NSR.  CXR report on 05/10/13 showed: Enlargement of cardiac silhouette. Calcified tortuous aorta. Pulmonary vascularity normal. Minimal right base atelectasis. Chronic accentuation of perihilar markings and minimal peribronchial thickening question bronchitis. No acute infiltrate, pleural effusion, or pneumothorax. Bones diffusely demineralized with multilevel endplate spur formation thoracic spine.  Preoperative labs noted.  Anticipate he can proceed as planned.  Glidescope was used for successful intubation during 2008 and 2010 surgeries.  His anesthesiologist will discuss the definitve anesthesia plan on the day of surgery.  Velna Ochs [facility] Short Stay Center/Anesthesiology Phone [phone] 05/11/2013 12:02 PM

## 2013-05-16 ENCOUNTER — Ambulatory Visit
Admission: RE | Admit: 2013-05-16 | Discharge: 2013-05-16 | Disposition: A | Discharge status: Home / Self Care | Payer: Medicare Other | Source: Ambulatory Visit | Attending: Neurosurgery | Admitting: Neurosurgery

## 2013-05-16 DIAGNOSIS — M5136 Other intervertebral disc degeneration, lumbar region: Secondary | ICD-10-CM

## 2013-05-17 MED ORDER — CEFAZOLIN SODIUM-DEXTROSE 2-3 GM-% IV SOLR
2.0000 g | INTRAVENOUS | Status: AC
  Administered 2013-05-18 (×2): 2 g via INTRAVENOUS
  Filled 2013-05-17: qty 50

## 2013-05-18 ENCOUNTER — Inpatient Hospital Stay (HOSPITAL_COMMUNITY): Payer: Medicare Other | Admitting: Anesthesiology

## 2013-05-18 ENCOUNTER — Inpatient Hospital Stay (HOSPITAL_COMMUNITY): Payer: Medicare Other

## 2013-05-18 ENCOUNTER — Inpatient Hospital Stay (HOSPITAL_COMMUNITY)
Admission: RE | Admit: 2013-05-18 | Discharge: 2013-05-21 | DRG: 460 | Disposition: A | Discharge status: Rehabilitation Facility | Payer: Medicare Other | Source: Ambulatory Visit | Attending: Neurosurgery | Admitting: Neurosurgery

## 2013-05-18 ENCOUNTER — Encounter (HOSPITAL_COMMUNITY): Payer: Self-pay | Admitting: *Deleted

## 2013-05-18 ENCOUNTER — Encounter (HOSPITAL_COMMUNITY)
Admission: RE | Disposition: A | Discharge status: Rehabilitation Facility | Payer: Medicare Other | Source: Ambulatory Visit | Attending: Neurosurgery

## 2013-05-18 ENCOUNTER — Encounter (HOSPITAL_COMMUNITY): Payer: Self-pay | Admitting: Vascular Surgery

## 2013-05-18 DIAGNOSIS — Z9089 Acquired absence of other organs: Secondary | ICD-10-CM

## 2013-05-18 DIAGNOSIS — M5137 Other intervertebral disc degeneration, lumbosacral region: Principal | ICD-10-CM | POA: Diagnosis present

## 2013-05-18 DIAGNOSIS — K219 Gastro-esophageal reflux disease without esophagitis: Secondary | ICD-10-CM | POA: Diagnosis present

## 2013-05-18 DIAGNOSIS — J9819 Other pulmonary collapse: Secondary | ICD-10-CM | POA: Diagnosis not present

## 2013-05-18 DIAGNOSIS — M948X9 Other specified disorders of cartilage, unspecified sites: Secondary | ICD-10-CM | POA: Diagnosis present

## 2013-05-18 DIAGNOSIS — Z8546 Personal history of malignant neoplasm of prostate: Secondary | ICD-10-CM

## 2013-05-18 DIAGNOSIS — M4716 Other spondylosis with myelopathy, lumbar region: Secondary | ICD-10-CM | POA: Diagnosis present

## 2013-05-18 DIAGNOSIS — R197 Diarrhea, unspecified: Secondary | ICD-10-CM | POA: Diagnosis not present

## 2013-05-18 DIAGNOSIS — M51379 Other intervertebral disc degeneration, lumbosacral region without mention of lumbar back pain or lower extremity pain: Principal | ICD-10-CM | POA: Diagnosis present

## 2013-05-18 DIAGNOSIS — E039 Hypothyroidism, unspecified: Secondary | ICD-10-CM | POA: Diagnosis present

## 2013-05-18 DIAGNOSIS — Z23 Encounter for immunization: Secondary | ICD-10-CM

## 2013-05-18 DIAGNOSIS — I10 Essential (primary) hypertension: Secondary | ICD-10-CM | POA: Diagnosis present

## 2013-05-18 DIAGNOSIS — Q762 Congenital spondylolisthesis: Secondary | ICD-10-CM

## 2013-05-18 DIAGNOSIS — Z87891 Personal history of nicotine dependence: Secondary | ICD-10-CM

## 2013-05-18 DIAGNOSIS — Z79899 Other long term (current) drug therapy: Secondary | ICD-10-CM

## 2013-05-18 DIAGNOSIS — Z981 Arthrodesis status: Secondary | ICD-10-CM

## 2013-05-18 DIAGNOSIS — M48062 Spinal stenosis, lumbar region with neurogenic claudication: Secondary | ICD-10-CM | POA: Diagnosis present

## 2013-05-18 SURGERY — POSTERIOR LUMBAR FUSION 2 LEVEL
Anesthesia: General | Site: Back | Wound class: Clean

## 2013-05-18 MED ORDER — MENTHOL 3 MG MT LOZG: 1.0000 | LOZENGE | OROMUCOSAL | Status: DC | PRN

## 2013-05-18 MED ORDER — ACETAMINOPHEN 325 MG PO TABS
650.0000 mg | ORAL_TABLET | ORAL | Status: DC | PRN
  Administered 2013-05-19 (×2): 650 mg via ORAL
  Filled 2013-05-18 (×2): qty 2

## 2013-05-18 MED ORDER — METOPROLOL TARTRATE 25 MG PO TABS
25.0000 mg | ORAL_TABLET | Freq: Two times a day (BID) | ORAL | Status: DC
  Administered 2013-05-18 – 2013-05-21 (×6): 25 mg via ORAL
  Filled 2013-05-18 (×7): qty 1

## 2013-05-18 MED ORDER — VECURONIUM BROMIDE 10 MG IV SOLR
INTRAVENOUS | Status: DC | PRN
  Administered 2013-05-18 (×2): 2 mg via INTRAVENOUS
  Administered 2013-05-18 (×2): 1 mg via INTRAVENOUS

## 2013-05-18 MED ORDER — SENNA 8.6 MG PO TABS
2.0000 | ORAL_TABLET | Freq: Every day | ORAL | Status: DC
  Administered 2013-05-18 – 2013-05-19 (×2): 17.2 mg via ORAL
  Filled 2013-05-18 (×4): qty 2

## 2013-05-18 MED ORDER — GLYCOPYRROLATE 0.2 MG/ML IJ SOLN
INTRAMUSCULAR | Status: DC | PRN
  Administered 2013-05-18: .4 mg via INTRAVENOUS

## 2013-05-18 MED ORDER — CEFAZOLIN SODIUM 1-5 GM-% IV SOLN
1.0000 g | Freq: Three times a day (TID) | INTRAVENOUS | Status: AC
  Administered 2013-05-18 – 2013-05-19 (×2): 1 g via INTRAVENOUS
  Filled 2013-05-18 (×2): qty 50

## 2013-05-18 MED ORDER — ALBUMIN HUMAN 25 % IV SOLN
INTRAVENOUS | Status: DC | PRN
  Administered 2013-05-18: 11:00:00 via INTRAVENOUS

## 2013-05-18 MED ORDER — ONDANSETRON HCL 4 MG/2ML IJ SOLN
INTRAMUSCULAR | Status: DC | PRN
  Administered 2013-05-18: 4 mg via INTRAMUSCULAR

## 2013-05-18 MED ORDER — BENZONATATE 100 MG PO CAPS
100.0000 mg | ORAL_CAPSULE | Freq: Two times a day (BID) | ORAL | Status: DC
  Administered 2013-05-18 – 2013-05-21 (×6): 100 mg via ORAL
  Filled 2013-05-18 (×7): qty 1

## 2013-05-18 MED ORDER — BACITRACIN ZINC 500 UNIT/GM EX OINT
TOPICAL_OINTMENT | CUTANEOUS | Status: DC | PRN
  Administered 2013-05-18: 1 via TOPICAL

## 2013-05-18 MED ORDER — SODIUM CHLORIDE 0.9 % IJ SOLN: 3.0000 mL | INTRAMUSCULAR | Status: DC | PRN

## 2013-05-18 MED ORDER — ZOLPIDEM TARTRATE 5 MG PO TABS: 5.0000 mg | ORAL_TABLET | Freq: Every evening | ORAL | Status: DC | PRN

## 2013-05-18 MED ORDER — IMIPRAMINE HCL 25 MG PO TABS
25.0000 mg | ORAL_TABLET | Freq: Two times a day (BID) | ORAL | Status: DC
  Administered 2013-05-18 – 2013-05-21 (×6): 25 mg via ORAL
  Filled 2013-05-18 (×7): qty 1

## 2013-05-18 MED ORDER — ACETAMINOPHEN 650 MG RE SUPP: 650.0000 mg | RECTAL | Status: DC | PRN

## 2013-05-18 MED ORDER — SODIUM CHLORIDE 0.9 % IJ SOLN
3.0000 mL | Freq: Two times a day (BID) | INTRAMUSCULAR | Status: DC
  Administered 2013-05-20 – 2013-05-21 (×3): 3 mL via INTRAVENOUS

## 2013-05-18 MED ORDER — DIPHENHYDRAMINE HCL 12.5 MG/5ML PO ELIX: 12.5000 mg | ORAL_SOLUTION | Freq: Four times a day (QID) | ORAL | Status: DC | PRN

## 2013-05-18 MED ORDER — INFLUENZA VAC SPLIT QUAD 0.5 ML IM SUSP
0.5000 mL | INTRAMUSCULAR | Status: AC
  Filled 2013-05-18: qty 0.5

## 2013-05-18 MED ORDER — ROCURONIUM BROMIDE 100 MG/10ML IV SOLN
INTRAVENOUS | Status: DC | PRN
  Administered 2013-05-18: 50 mg via INTRAVENOUS

## 2013-05-18 MED ORDER — SODIUM CHLORIDE 0.9 % IV SOLN: 250.0000 mL | INTRAVENOUS | Status: DC

## 2013-05-18 MED ORDER — 0.9 % SODIUM CHLORIDE (POUR BTL) OPTIME
TOPICAL | Status: DC | PRN
  Administered 2013-05-18: 1000 mL

## 2013-05-18 MED ORDER — PHENOL 1.4 % MT LIQD
1.0000 | OROMUCOSAL | Status: DC | PRN
  Filled 2013-05-18: qty 177

## 2013-05-18 MED ORDER — DARIFENACIN HYDROBROMIDE ER 7.5 MG PO TB24
7.5000 mg | ORAL_TABLET | Freq: Every day | ORAL | Status: DC
  Administered 2013-05-19 – 2013-05-21 (×3): 7.5 mg via ORAL
  Filled 2013-05-18 (×3): qty 1

## 2013-05-18 MED ORDER — LEVOTHYROXINE SODIUM 50 MCG PO TABS
50.0000 ug | ORAL_TABLET | ORAL | Status: DC
  Administered 2013-05-19 – 2013-05-21 (×3): 50 ug via ORAL
  Filled 2013-05-18 (×3): qty 1

## 2013-05-18 MED ORDER — LIDOCAINE HCL 4 % MT SOLN
OROMUCOSAL | Status: DC | PRN
  Administered 2013-05-18: 4 mL via TOPICAL

## 2013-05-18 MED ORDER — LIDOCAINE HCL (CARDIAC) 20 MG/ML IV SOLN
INTRAVENOUS | Status: DC | PRN
  Administered 2013-05-18: 40 mg via INTRAVENOUS

## 2013-05-18 MED ORDER — OXYCODONE HCL 5 MG/5ML PO SOLN: 5.0000 mg | Freq: Once | ORAL | Status: DC | PRN

## 2013-05-18 MED ORDER — PHENYLEPHRINE HCL 10 MG/ML IJ SOLN
10.0000 mg | INTRAVENOUS | Status: DC | PRN
  Administered 2013-05-18: 25 ug/min via INTRAVENOUS

## 2013-05-18 MED ORDER — AMLODIPINE BESYLATE 10 MG PO TABS
10.0000 mg | ORAL_TABLET | Freq: Every day | ORAL | Status: DC
  Administered 2013-05-19 – 2013-05-21 (×3): 10 mg via ORAL
  Filled 2013-05-18 (×3): qty 1

## 2013-05-18 MED ORDER — HYDROMORPHONE HCL PF 1 MG/ML IJ SOLN
0.2500 mg | INTRAMUSCULAR | Status: DC | PRN
Start: 2013-05-18 — End: 2013-05-18
  Administered 2013-05-18 (×2): 0.5 mg via INTRAVENOUS

## 2013-05-18 MED ORDER — GUAIFENESIN ER 600 MG PO TB12
600.0000 mg | ORAL_TABLET | Freq: Two times a day (BID) | ORAL | Status: DC
  Administered 2013-05-18 – 2013-05-21 (×6): 600 mg via ORAL
  Filled 2013-05-18 (×7): qty 1

## 2013-05-18 MED ORDER — THROMBIN 20000 UNITS EX SOLR
CUTANEOUS | Status: DC | PRN
  Administered 2013-05-18: 09:00:00 via TOPICAL

## 2013-05-18 MED ORDER — MIDAZOLAM HCL 2 MG/2ML IJ SOLN: 0.5000 mg | Freq: Once | INTRAMUSCULAR | Status: DC | PRN

## 2013-05-18 MED ORDER — PROPOFOL 10 MG/ML IV BOLUS
INTRAVENOUS | Status: DC | PRN
  Administered 2013-05-18: 150 mg via INTRAVENOUS

## 2013-05-18 MED ORDER — HYDROMORPHONE HCL PF 1 MG/ML IJ SOLN
INTRAMUSCULAR | Status: AC
  Filled 2013-05-18: qty 1

## 2013-05-18 MED ORDER — HEPARIN SODIUM (PORCINE) 5000 UNIT/ML IJ SOLN
5000.0000 [IU] | Freq: Three times a day (TID) | INTRAMUSCULAR | Status: DC
  Administered 2013-05-18 – 2013-05-21 (×7): 5000 [IU] via SUBCUTANEOUS
  Filled 2013-05-18 (×11): qty 1

## 2013-05-18 MED ORDER — ONDANSETRON HCL 4 MG/2ML IJ SOLN: 4.0000 mg | Freq: Four times a day (QID) | INTRAMUSCULAR | Status: DC | PRN

## 2013-05-18 MED ORDER — DEXAMETHASONE SODIUM PHOSPHATE 4 MG/ML IJ SOLN
INTRAMUSCULAR | Status: DC | PRN
  Administered 2013-05-18: 8 mg via INTRAVENOUS

## 2013-05-18 MED ORDER — FENTANYL CITRATE 0.05 MG/ML IJ SOLN
INTRAMUSCULAR | Status: DC | PRN
  Administered 2013-05-18: 250 ug via INTRAVENOUS

## 2013-05-18 MED ORDER — ONDANSETRON HCL 4 MG/2ML IJ SOLN: 4.0000 mg | INTRAMUSCULAR | Status: DC | PRN

## 2013-05-18 MED ORDER — DULOXETINE HCL 60 MG PO CPEP
60.0000 mg | ORAL_CAPSULE | Freq: Every day | ORAL | Status: DC
  Administered 2013-05-19 – 2013-05-21 (×3): 60 mg via ORAL
  Filled 2013-05-18 (×3): qty 1

## 2013-05-18 MED ORDER — OXYCODONE HCL 5 MG PO TABS: 5.0000 mg | ORAL_TABLET | Freq: Once | ORAL | Status: DC | PRN

## 2013-05-18 MED ORDER — SODIUM CHLORIDE 0.9 % IV SOLN
INTRAVENOUS | Status: DC
  Administered 2013-05-18 – 2013-05-19 (×2): via INTRAVENOUS

## 2013-05-18 MED ORDER — DIAZEPAM 5 MG PO TABS
5.0000 mg | ORAL_TABLET | Freq: Four times a day (QID) | ORAL | Status: DC | PRN
  Administered 2013-05-18 – 2013-05-20 (×5): 5 mg via ORAL
  Filled 2013-05-18 (×5): qty 1

## 2013-05-18 MED ORDER — ARTIFICIAL TEARS OP OINT
TOPICAL_OINTMENT | OPHTHALMIC | Status: DC | PRN
  Administered 2013-05-18: 1 via OPHTHALMIC

## 2013-05-18 MED ORDER — LACTATED RINGERS IV SOLN
INTRAVENOUS | Status: DC | PRN
  Administered 2013-05-18 (×3): via INTRAVENOUS

## 2013-05-18 MED ORDER — MIDAZOLAM HCL 5 MG/5ML IJ SOLN
INTRAMUSCULAR | Status: DC | PRN
  Administered 2013-05-18: 2 mg via INTRAVENOUS

## 2013-05-18 MED ORDER — CEFAZOLIN SODIUM-DEXTROSE 2-3 GM-% IV SOLR
INTRAVENOUS | Status: AC
  Filled 2013-05-18: qty 50

## 2013-05-18 MED ORDER — MEPERIDINE HCL 25 MG/ML IJ SOLN: 6.2500 mg | INTRAMUSCULAR | Status: DC | PRN

## 2013-05-18 MED ORDER — OXYCODONE-ACETAMINOPHEN 5-325 MG PO TABS: 1.0000 | ORAL_TABLET | ORAL | Status: DC | PRN

## 2013-05-18 MED ORDER — NALOXONE HCL 0.4 MG/ML IJ SOLN: 0.4000 mg | INTRAMUSCULAR | Status: DC | PRN

## 2013-05-18 MED ORDER — MORPHINE SULFATE (PF) 1 MG/ML IV SOLN
INTRAVENOUS | Status: AC
  Filled 2013-05-18: qty 25

## 2013-05-18 MED ORDER — HYDROMORPHONE HCL PF 1 MG/ML IJ SOLN
INTRAMUSCULAR | Status: DC | PRN
  Administered 2013-05-18 (×2): .25 mg via INTRAVENOUS

## 2013-05-18 MED ORDER — SODIUM CHLORIDE 0.9 % IJ SOLN: 9.0000 mL | INTRAMUSCULAR | Status: DC | PRN

## 2013-05-18 MED ORDER — DUTASTERIDE 0.5 MG PO CAPS
0.5000 mg | ORAL_CAPSULE | Freq: Every day | ORAL | Status: DC
  Administered 2013-05-19 – 2013-05-21 (×3): 0.5 mg via ORAL
  Filled 2013-05-18 (×3): qty 1

## 2013-05-18 MED ORDER — MORPHINE SULFATE (PF) 1 MG/ML IV SOLN
INTRAVENOUS | Status: DC
  Administered 2013-05-18: 7.5 mg via INTRAVENOUS
  Administered 2013-05-18: 13:00:00 via INTRAVENOUS
  Administered 2013-05-18: 4.5 mg via INTRAVENOUS
  Administered 2013-05-18: 3 mg via INTRAVENOUS
  Administered 2013-05-19: 25 mg via INTRAVENOUS
  Administered 2013-05-19: 8.54 mg via INTRAVENOUS
  Administered 2013-05-19 (×2): 4.5 mg via INTRAVENOUS
  Administered 2013-05-19: 3 mg via INTRAVENOUS
  Administered 2013-05-19: 6 mg via INTRAVENOUS
  Administered 2013-05-20: 1.5 mg via INTRAVENOUS
  Filled 2013-05-18: qty 25

## 2013-05-18 MED ORDER — PROMETHAZINE HCL 25 MG/ML IJ SOLN: 6.2500 mg | INTRAMUSCULAR | Status: DC | PRN

## 2013-05-18 MED ORDER — NEOSTIGMINE METHYLSULFATE 1 MG/ML IJ SOLN
INTRAMUSCULAR | Status: DC | PRN
  Administered 2013-05-18: 3 mg via INTRAVENOUS

## 2013-05-18 MED ORDER — DIPHENHYDRAMINE HCL 50 MG/ML IJ SOLN: 12.5000 mg | Freq: Four times a day (QID) | INTRAMUSCULAR | Status: DC | PRN

## 2013-05-18 MED ORDER — LEVOTHYROXINE SODIUM 75 MCG PO TABS
75.0000 ug | ORAL_TABLET | ORAL | Status: DC
  Filled 2013-05-18: qty 1

## 2013-05-18 SURGICAL SUPPLY — 74 items
3.2MM X 18.3MM HELIOCOIDAL RASP LONG ×2 IMPLANT
BENZOIN TINCTURE PRP APPL 2/3 (GAUZE/BANDAGES/DRESSINGS) ×2 IMPLANT
BLADE SURG ROTATE 9660 (MISCELLANEOUS) IMPLANT
BUR ACORN 6.0 (BURR) ×4 IMPLANT
BUR MATCHSTICK NEURO 3.0 LAGG (BURR) ×2 IMPLANT
CANISTER SUCTION 2500CC (MISCELLANEOUS) ×2 IMPLANT
CAP REVERE LOCKING (Cap) ×12 IMPLANT
CLOTH BEACON ORANGE TIMEOUT ST (SAFETY) ×2 IMPLANT
CONT SPEC 4OZ CLIKSEAL STRL BL (MISCELLANEOUS) ×4 IMPLANT
COVER BACK TABLE 24X17X13 BIG (DRAPES) IMPLANT
COVER TABLE BACK 60X90 (DRAPES) ×2 IMPLANT
DRAPE C-ARM 42X72 X-RAY (DRAPES) ×4 IMPLANT
DRAPE LAPAROTOMY 100X72X124 (DRAPES) ×2 IMPLANT
DRAPE POUCH INSTRU U-SHP 10X18 (DRAPES) ×2 IMPLANT
DRESSING TELFA 8X3 (GAUZE/BANDAGES/DRESSINGS) ×2 IMPLANT
DRSG PAD ABDOMINAL 8X10 ST (GAUZE/BANDAGES/DRESSINGS) IMPLANT
DURAFORM SPONGE 2X2 SINGLE (Neuro Prosthesis/Implant) ×2 IMPLANT
DURAPREP 26ML APPLICATOR (WOUND CARE) ×2 IMPLANT
ELECT BLADE 4.0 EZ CLEAN MEGAD (MISCELLANEOUS) ×2
ELECT REM PT RETURN 9FT ADLT (ELECTROSURGICAL) ×2
ELECTRODE BLDE 4.0 EZ CLN MEGD (MISCELLANEOUS) ×1 IMPLANT
ELECTRODE REM PT RTRN 9FT ADLT (ELECTROSURGICAL) ×1 IMPLANT
EVACUATOR 1/8 PVC DRAIN (DRAIN) IMPLANT
GAUZE SPONGE 4X4 16PLY XRAY LF (GAUZE/BANDAGES/DRESSINGS) ×2 IMPLANT
GLOVE BIOGEL M 8.0 STRL (GLOVE) ×4 IMPLANT
GLOVE ECLIPSE 7.5 STRL STRAW (GLOVE) ×8 IMPLANT
GLOVE ECLIPSE 8.5 STRL (GLOVE) ×2 IMPLANT
GLOVE EXAM NITRILE LRG STRL (GLOVE) ×4 IMPLANT
GLOVE EXAM NITRILE MD LF STRL (GLOVE) IMPLANT
GLOVE EXAM NITRILE XL STR (GLOVE) IMPLANT
GLOVE EXAM NITRILE XS STR PU (GLOVE) IMPLANT
GLOVE INDICATOR 8.0 STRL GRN (GLOVE) ×6 IMPLANT
GLOVE INDICATOR 8.5 STRL (GLOVE) ×2 IMPLANT
GOWN BRE IMP SLV AUR LG STRL (GOWN DISPOSABLE) ×2 IMPLANT
GOWN BRE IMP SLV AUR XL STRL (GOWN DISPOSABLE) ×2 IMPLANT
GOWN STRL REIN 2XL LVL4 (GOWN DISPOSABLE) ×6 IMPLANT
KIT BASIN OR (CUSTOM PROCEDURE TRAY) ×2 IMPLANT
KIT INFUSE MEDIUM (Orthopedic Implant) ×2 IMPLANT
KIT ROOM TURNOVER OR (KITS) ×2 IMPLANT
MILL MEDIUM DISP (BLADE) ×2 IMPLANT
NEEDLE HYPO 18GX1.5 BLUNT FILL (NEEDLE) IMPLANT
NEEDLE HYPO 21X1.5 SAFETY (NEEDLE) IMPLANT
NEEDLE HYPO 25X1 1.5 SAFETY (NEEDLE) IMPLANT
NS IRRIG 1000ML POUR BTL (IV SOLUTION) ×2 IMPLANT
PACK LAMINECTOMY NEURO (CUSTOM PROCEDURE TRAY) ×2 IMPLANT
PAD ARMBOARD 7.5X6 YLW CONV (MISCELLANEOUS) ×6 IMPLANT
PATTIES SURGICAL .5 X.5 (GAUZE/BANDAGES/DRESSINGS) ×2 IMPLANT
PATTIES SURGICAL .5 X1 (DISPOSABLE) ×2 IMPLANT
PATTIES SURGICAL .5 X3 (DISPOSABLE) IMPLANT
PATTIES SURGICAL 1X1 (DISPOSABLE) ×2 IMPLANT
ROD REVERE 7.5MM (Rod) ×4 IMPLANT
SCREW REVERE 6.35 6.5MMX45 (Screw) ×8 IMPLANT
SCREW REVERE 6.35 6.5X55MM (Screw) ×4 IMPLANT
SPACER SUSTAIN O SML 8X22 12MM (Spacer) ×4 IMPLANT
SPONGE GAUZE 4X4 12PLY (GAUZE/BANDAGES/DRESSINGS) ×2 IMPLANT
SPONGE LAP 4X18 X RAY DECT (DISPOSABLE) IMPLANT
SPONGE NEURO XRAY DETECT 1X3 (DISPOSABLE) IMPLANT
SPONGE SURGIFOAM ABS GEL 100 (HEMOSTASIS) ×2 IMPLANT
STAPLER SKIN PROX WIDE 3.9 (STAPLE) ×2 IMPLANT
STRIP CLOSURE SKIN 1/2X4 (GAUZE/BANDAGES/DRESSINGS) ×2 IMPLANT
STRIP VITOSS 25X100X4MM (Neuro Prosthesis/Implant) ×2 IMPLANT
SUT PROLENE 5 0 C1 (SUTURE) ×2 IMPLANT
SUT VIC AB 1 CT1 18XBRD ANBCTR (SUTURE) ×2 IMPLANT
SUT VIC AB 1 CT1 8-18 (SUTURE) ×2
SUT VIC AB 2-0 CP2 18 (SUTURE) ×2 IMPLANT
SUT VIC AB 3-0 SH 8-18 (SUTURE) ×2 IMPLANT
SYR 20CC LL (SYRINGE) IMPLANT
SYR 20ML ECCENTRIC (SYRINGE) ×2 IMPLANT
SYR 5ML LL (SYRINGE) IMPLANT
TAPE CLOTH SURG 4X10 WHT LF (GAUZE/BANDAGES/DRESSINGS) ×2 IMPLANT
TOWEL OR 17X24 6PK STRL BLUE (TOWEL DISPOSABLE) ×2 IMPLANT
TOWEL OR 17X26 10 PK STRL BLUE (TOWEL DISPOSABLE) ×2 IMPLANT
TRAY FOLEY CATH 14FRSI W/METER (CATHETERS) ×2 IMPLANT
WATER STERILE IRR 1000ML POUR (IV SOLUTION) ×2 IMPLANT

## 2013-05-18 NOTE — H&P (Signed)
[name] is an 77 y.o. male.   Chief Complaint: lbp HPI: patient complaining of lbp with radiation to both lower extremities up to the point that now he stays at home because the pain gets worse with ambulation. Treatment has been of no help.  Past Medical History  Diagnosis Date  . Prostate cancer     hx of prostate ca  . Hypertension   . Thyroid disease   . Hypothyroidism   . Cough     due to scarring of lung  . GERD (gastroesophageal reflux disease)   . Arthritis   . Complication of anesthesia     sometimes have trouble intubating not last 2 times; limited neck mobility--glidescope used in '08 and '10    Past Surgical History  Procedure Laterality Date  . Lung surgery Right 09    growth on outside of lung  . Tonsillectomy    . Appendectomy    . Shoulder surgery Left     rotator cuff x2  . Back surgery    . Cervical spine surgery    . Knee arthroscopy Right   . Eye surgery Bilateral 10    History reviewed. No pertinent family history. Social History:  reports that he quit smoking about 31 years ago. His smoking use included Cigarettes. He has a 45 pack-year smoking history. He does not have any smokeless tobacco history on file. He reports that he does not drink alcohol or use illicit drugs.  Allergies: No Known Allergies  Medications Prior to Admission  Medication Sig Dispense Refill  . amLODipine (NORVASC) 5 MG tablet Take 10 mg by mouth daily. Hold if SBP <100      . benzonatate (TESSALON) 100 MG capsule Take 100 mg by mouth 2 (two) times daily.      . diazepam (VALIUM) 5 MG tablet Take 5 mg by mouth 2 (two) times daily as needed for anxiety.      . DULoxetine (CYMBALTA) 60 MG capsule Take 60 mg by mouth daily.      Marland Kitchen dutasteride (AVODART) 0.5 MG capsule Take 0.5 mg by mouth daily.      . fluticasone (FLONASE) 50 MCG/ACT nasal spray Place 2 sprays into the nose daily.      Marland Kitchen guaiFENesin (MUCINEX) 600 MG 12 hr tablet Take 600 mg by mouth 2 (two) times daily.       Marland Kitchen ibuprofen (ADVIL,MOTRIN) 800 MG tablet Take 800 mg by mouth every 8 (eight) hours as needed for pain.      Marland Kitchen levothyroxine (SYNTHROID, LEVOTHROID) 50 MCG tablet Take 50 mcg by mouth See admin instructions. Take on Mondays, Tuesdays, Wednesdays, Thursdays, and Fridays.      Marland Kitchen levothyroxine (SYNTHROID, LEVOTHROID) 75 MCG tablet Take 75 mcg by mouth See admin instructions. Take on Saturdays and Sundays only.      Marland Kitchen LORazepam (ATIVAN) 1 MG tablet Take 1 mg by mouth 2 (two) times daily.      . metoprolol tartrate (LOPRESSOR) 25 MG tablet Take 25 mg by mouth 2 (two) times daily.      Marland Kitchen omeprazole (PRILOSEC) 20 MG capsule Take 20 mg by mouth 2 (two) times daily.      Marland Kitchen oxyCODONE (ROXICODONE) 15 MG immediate release tablet Take 30 mg by mouth every 6 (six) hours as needed. For pain.      Marland Kitchen senna (SENOKOT) 8.6 MG TABS Take 2 tablets by mouth at bedtime.      . solifenacin (VESICARE) 10 MG tablet Take  10 mg by mouth daily.      . Vitamin D, Ergocalciferol, (DRISDOL) 50000 UNITS CAPS Take 50,000 Units by mouth every 7 (seven) days. Patient tries to take on Mondays or Tuesdays.      Marland Kitchen imipramine (TOFRANIL) 25 MG tablet Take 25 mg by mouth 2 (two) times daily.        No results found for this or any previous visit (from the past 48 hour(s)). [name]  05/16/2013   CLINICAL DATA:  Low back pain. Bilateral leg numbness.  EXAM: MRI LUMBAR SPINE WITHOUT CONTRAST  TECHNIQUE: Multiplanar, multisequence MR imaging was performed. No intravenous contrast was administered.  COMPARISON:  CT myelogram 12/30/2012.  FINDINGS: Previous L3-4 fusion appears solid. Anatomic alignment except for 4 mm anterolisthesis L4-5. Moderate disc space narrowing L4-5. Congenitally short pedicles. Normal conus. Except for endplate changes at L4-5, normal vertebral body marrow signal throughout without compression fracture. Paravertebral soft tissues unremarkable.  At L1-L2, there is mild congenital and acquired stenosis  with short pedicles, annular bulging, and posterior element hypertrophy. There is no impingement.  At L2-L3, there is moderate adjacent segment disease, with mild annular bulging, facet and ligamentum flavum hypertrophy, and short pedicles. No impingement.  The L3-4 level is solid. No residual impingement.  At L4-5, there is severe adjacent segment disease. There is advanced disc space narrowing, 4 mm of facet mediated slip, advanced facet arthropathy, as well as a shallow central protrusion. There is severe central canal stenosis. Bilateral foraminal narrowing is present. Bilateral L4 and L5 nerve root impingement are observed.  At L5-S1, short pedicles are again noted. There is mild annular bulging. There is moderately advanced facet and ligamentum flavum hypertrophy. There is a 5 x 6 mm synovial cyst on the left. There is severe bilateral foraminal narrowing. Left greater than right L5 and S1 nerve root impingement are likely go.  When compared with prior CT myelogram, the appearance is similar.  IMPRESSION: Solid L3-4 fusion.  Severe adjacent segment disease at L4-5, with bilateral L4 and L5 nerve root impingement.  Congenital and acquired stenosis with bilateral foraminal narrowing also moderately severe at L5-S1, left greater than right.   Electronically Signed   By: Davonna Belling M.D.   On: 05/16/2013 13:39    Review of Systems  Constitutional: Negative.   HENT: Positive for neck pain.   Respiratory: Negative.   Genitourinary: Positive for frequency.  Musculoskeletal: Positive for back pain.  Skin: Negative.   Neurological: Positive for speech change and focal weakness.  Psychiatric/Behavioral: Positive for memory loss.    Blood pressure 152/71, pulse 75, temperature 97.4 F (36.3 C), temperature source Oral, resp. rate 18. Physical Exam hent, nl. Neck some pain with movement. Cv, nl. Lungs, clear. Abdomen, soft. extremoities nl. NEURO  SLR POSITIVE AT 80 DEGREES. FEMORAL MANEUVER POSITIVE  BILATERALLY. MRI SHOS SEVERE DDD AT L45 AND L5S1. AREA OF L34 SHOWS GOOD FUSION  Assessment/Plan  the patient is to have decompression and fusion at l45, 5s1. He and his wife are aware of risks and benefits  Jacody Beneke M 05/18/2013, 8:02 AM

## 2013-05-18 NOTE — Anesthesia Postprocedure Evaluation (Signed)
  Anesthesia Post-op Note  Patient: [name]  Procedure(s) Performed: Procedure(s) with comments: Lumbar Four-Five, Lumbar Five-Sacral One, Posterior lumbar interbody fusion (N/A) - POSTERIOR LUMBAR FUSION 2 LEVEL  Patient Location: PACU  Anesthesia Type:General  Level of Consciousness: awake, alert , oriented and patient cooperative  Airway and Oxygen Therapy: Patient Spontanous Breathing and Patient connected to nasal cannula oxygen  Post-op Pain: mild  Post-op Assessment: Post-op Vital signs reviewed, Patient's Cardiovascular Status Stable, Respiratory Function Stable, Patent Airway, No signs of Nausea or vomiting and Pain level controlled  Post-op Vital Signs: Reviewed and stable  Complications: No apparent anesthesia complications

## 2013-05-18 NOTE — Anesthesia Procedure Notes (Signed)
Procedure Name: Intubation Date/Time: 05/18/2013 8:22 AM Performed by: Tyrone Nine Pre-anesthesia Checklist: Patient identified, Timeout performed, Emergency Drugs available, Suction available and Patient being monitored Patient Re-evaluated:Patient Re-evaluated prior to inductionOxygen Delivery Method: Circle system utilized Preoxygenation: Pre-oxygenation with 100% oxygen Intubation Type: IV induction Ventilation: Two handed mask ventilation required and Oral airway inserted - appropriate to patient size Laryngoscope Size: Mac and 3 Grade View: Grade I Tube type: Oral Tube size: 7.5 mm Number of attempts: 1 Airway Equipment and Method: Video-laryngoscopy and Rigid stylet Placement Confirmation: ETT inserted through vocal cords under direct vision,  breath sounds checked- equal and bilateral and positive ETCO2 Secured at: 23 cm Tube secured with: Tape Dental Injury: Teeth and Oropharynx as per pre-operative assessment  Difficulty Due To: Difficulty was anticipated, Difficult Airway- due to anterior larynx and Difficult Airway- due to reduced neck mobility Future Recommendations: Recommend- induction with short-acting agent, and alternative techniques readily available

## 2013-05-18 NOTE — Progress Notes (Signed)
UR COMPLETED  

## 2013-05-18 NOTE — Anesthesia Preprocedure Evaluation (Addendum)
Anesthesia Evaluation  Patient identified by MRN, date of birth, ID band Patient awake    Reviewed: Allergy & Precautions, H&P , NPO status , Patient's Chart, lab work & pertinent test results, reviewed documented beta blocker date and time   History of Anesthesia Complications (+) DIFFICULT AIRWAY  Airway Mallampati: IV TM Distance: <3 FB Neck ROM: Limited  Mouth opening: Limited Mouth Opening  Dental  (+) Teeth Intact and Dental Advisory Given   Pulmonary former smoker,  H/o empyema: s/p VATS decort  05-10-13 Chest x-ray IMPRESSION: Enlargement of cardiac silhouette.   Suspect mild chronic bronchitic changes.   breath sounds clear to auscultation  Pulmonary exam normal       Cardiovascular Exercise Tolerance: Good hypertension, Pt. on medications and Pt. on home beta blockers Rhythm:Regular Rate:Normal  10-May-2013 10:59:25 [facility] ROUTINE RECORD Normal sinus rhythm Normal ECG   Neuro/Psych Anxiety Depression Chronic back pain: narcotics daily  IMPRESSION: Solid L3-4 fusion.   Severe adjacent segment disease at L4-5, with bilateral L4 and L5 nerve root impingement.   Congenital and acquired stenosis with bilateral foraminal narrowing also moderately severe at L5-S1, left greater than right.  05-16-13 CT lumbar spine     GI/Hepatic Neg liver ROS, GERD-  Medicated and Controlled,  Endo/Other  Hypothyroidism   Renal/GU negative Renal ROS   ? BPH/ urgency  sees Dr Patsi Sears    Musculoskeletal  (+) Arthritis -, Osteoarthritis,    Abdominal Normal abdominal exam  (+) + obese,   Peds  Hematology negative hematology ROS (+)   Anesthesia Other Findings Previous difficult intubation.  Successful with Glidescope first attempt.  Reproductive/Obstetrics                      Anesthesia Physical Anesthesia Plan  ASA: III  Anesthesia Plan: General   Post-op Pain  Management:    Induction: Intravenous  Airway Management Planned: Oral ETT and Video Laryngoscope Planned  Additional Equipment:   Intra-op Plan:   Post-operative Plan: Extubation in OR  Informed Consent: I have reviewed the patients History and Physical, chart, labs and discussed the procedure including the risks, benefits and alternatives for the proposed anesthesia with the patient or authorized representative who has indicated his/her understanding and acceptance.   Dental advisory given  Plan Discussed with: CRNA and Surgeon  Anesthesia Plan Comments: (Plan routine monitors, GETA with VideoGlide intubation)       Anesthesia Quick Evaluation

## 2013-05-18 NOTE — Progress Notes (Signed)
Op npte [id number]

## 2013-05-18 NOTE — Preoperative (Addendum)
Beta Blockers   Lopressor 25mg s taken on 05-19-13 @ 04:30

## 2013-05-18 NOTE — Transfer of Care (Signed)
Immediate Anesthesia Transfer of Care Note  Patient: [name]  Procedure(s) Performed: Procedure(s) with comments: Lumbar Four-Five, Lumbar Five-Sacral One, Posterior lumbar interbody fusion (N/A) - POSTERIOR LUMBAR FUSION 2 LEVEL  Patient Location: PACU  Anesthesia Type:General  Level of Consciousness: awake, alert , oriented and patient cooperative  Airway & Oxygen Therapy: Patient Spontanous Breathing and Patient connected to nasal cannula oxygen  Post-op Assessment: Report given to PACU RN and Post -op Vital signs reviewed and stable  Post vital signs: Reviewed and stable  Complications: No apparent anesthesia complications

## 2013-05-19 ENCOUNTER — Inpatient Hospital Stay (HOSPITAL_COMMUNITY): Payer: Medicare Other

## 2013-05-19 MED FILL — Heparin Sodium (Porcine) Inj 1000 Unit/ML: INTRAMUSCULAR | Qty: 30 | Status: AC

## 2013-05-19 MED FILL — Sodium Chloride IV Soln 0.9%: INTRAVENOUS | Qty: 1000 | Status: AC

## 2013-05-19 NOTE — Plan of Care (Signed)
Spoke with dr Jeral Fruit and updated md pt's current temp and what it was before tylenol. No further orders received. States to cont to monitor.

## 2013-05-19 NOTE — Op Note (Signed)
[name] NO.:  [id number]  MEDICAL RECORD NO.:  [id number]  LOCATION:  [id number]                        FACILITY:  [facility]  PHYSICIAN:  [name], M.D.   DATE OF BIRTH:  October 30, 1935  DATE OF PROCEDURE:  05/18/2013 DATE OF DISCHARGE:                              OPERATIVE REPORT   PREOPERATIVE DIAGNOSIS:  Degenerative disk disease, L4-L5, L5-S1 with chronic radiculopathy.  Diffuse idiopathic skeletal hyperostosis. Status post fusion L3-4.  POSTOPERATIVE DIAGNOSIS:  Degenerative disk disease, L4-L5, L5-S1 with chronic radiculopathy.  Diffuse idiopathic skeletal hyperostosis. Status post fusion L3-4.  PROCEDURE:  Removal of the previous L3-L4 screws.  Bilateral L4-L5 laminectomy and facetectomy.  Bilateral L5-S1 diskectomy more than normal to be allow to introduce 2 cages of 12 x 22.  Pedicle screws from L4, L5, S1.  Placement of the pedicle screws at L4 and removed 1 at L3. Decompression of the thecal sac and foraminotomy.  Posterolateral arthrodesis with the Vitoss, BMP, and autograft.  Cell Saver, C-arm picture.  SURGEON:  [name], M.D.  ASSISTANT:  [name], M.D.  CLINICAL HISTORY:  [name] is a gentleman who many years ago underwent fusion at the level of L3-4.  Lately, he had been complaining of back pain worsened to both the legs, which gets worse with walking.  Because of the pain, the patient had been unable to do any physical activity. The patient has failed with conservative treatment.  The patient also has a decompression of the precervical space secondary to large osteophyte.  I talked to him and his wife at length.  We repeated the MRI few days ago which showed again the spine being normal from L3-L4 up but at the 4-5 and 5-1, the patient had quite a bit of stenosis with degenerative disease.  Surgery was advised and the risks were explained to him and his wife including infection, CSF leak, no  improvement whatsoever, need for further surgery.  DESCRIPTION OF PROCEDURE:  The patient was taken to the OR, and after intubation, he was positioned in a prone manner.  The back was cleaned with Betadine first and later on with DuraPrep.  Drapes were applied. Midline incision following the previous one from L2-3 down to L5 was made in the midline and retractor was made laterally.  The patient had quite a bit of adhesion, and then we were able to remove the lateral screws at L3-L4, and be able to sit in the transverse process of L4-5 and the ala of the sacrum.  With the drill in the midline, we found quite a bit of adhesion.  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  Nevertheless, we were able to decompress the L4, L5 as well as the S1 nerve root.  Facetectomy was achieved.  We tried to get into the L4-5 disk space.  Then, the area was quite narrow and it was impossible to get in there.  Nevertheless at the level of 5-1 after we did that, lysis of adhesions, we were able to get inside from the right side first and then from the left side and total diskectomy where the  lateral was done.  The endplate were removed and we introduced 2 cages 12 x 22.  The patient's cages had autograft and BMP. Then, the pedicles of L3 were removed as well at the L4.  With the help of the C-arm 1st in the AP view and then a lateral view, we probed the pedicle probe of L4, L5, and S1.  We were able to introduce new screws at the level of L5-S1.  The previous one at the level of L4 were removed and replaced with a new 6.5 x 45.  Then, the screws were kept in place using Robs and Capps.  We went laterally and we removed the periosteum of the lateral aspect of the facet of L4-5 and L3-4 for 5-1 and a mix of BMP, Vitoss, and autograft was used for arthrodesis.  We went back again into the canal and there we found the space not only for the thecal sac but also for the  L3, L4, L5, S1 nerve root.  Valsalva maneuver was negative with DuraGen in the dorsal aspect of the thecal sac.  The area was irrigated and the muscle was closed with Vicryl and the skin with staples.          ______________________________ [name], M.D.     EB/MEDQ  D:  05/18/2013  T:  05/19/2013  Job:  [id number]

## 2013-05-19 NOTE — Progress Notes (Signed)
Kemaria Dedic, PT DPT  [phone]  

## 2013-05-19 NOTE — Progress Notes (Signed)
OT/ PT Cancellation Note  Patient Details Name: [name] MRN: [id number] DOB: May 27, 1936   Cancelled Treatment:    Reason Eval/Treat Not Completed: Medical issues which prohibited therapy (currently on bedrest until 05/20/13) Pt with ambulation POD #1 order in chart and RN May informed patient is currently on bedrest per Dr Jeral Fruit. Pt asking about back brace delivery and currently without bio tech order for brace. OT / PT to hold therapy awaiting activity orders and brace delivery tomorrow.  Harolyn Rutherford Pager: [phone]  05/19/2013, 9:58 AM

## 2013-05-19 NOTE — Plan of Care (Signed)
Instructed pt to use incentive spirometer 10 times every 2 hours, v/u.

## 2013-05-19 NOTE — Progress Notes (Signed)
Patient ID: [name], male   DOB: [birth year], 77 y.o.   MRN: [id number] Stable, no headache. No weakness. Some numbness as preop. Spoke with him and wife. To be oob in am. rehah medicine to see.

## 2013-05-19 NOTE — Progress Notes (Signed)
Chest X-Ray resulted in bibasilar atelectasis. Incentive spirometer encouraged with goal 2500 ml. Pt at 2000 ml. Temperature decreased to 99.4 at 1854 post 650 mg Tylenol given orally. Will continue to monitor closely.

## 2013-05-19 NOTE — Progress Notes (Signed)
Pt temperature continues to increase; now 102.0. Dr Jeral Fruit paged and notified. New order received.

## 2013-05-19 NOTE — Plan of Care (Signed)
Dr Jeral Fruit called in regards to pt temp of 100.9 orally after administration of tylenol. Waiting for md to call back.

## 2013-05-20 DIAGNOSIS — M48062 Spinal stenosis, lumbar region with neurogenic claudication: Secondary | ICD-10-CM

## 2013-05-20 MED ORDER — TAMSULOSIN HCL 0.4 MG PO CAPS
0.8000 mg | ORAL_CAPSULE | Freq: Every day | ORAL | Status: DC
  Administered 2013-05-20 – 2013-05-21 (×2): 0.8 mg via ORAL
  Filled 2013-05-20 (×2): qty 2

## 2013-05-20 MED ORDER — OXYCODONE HCL 5 MG PO TABS
15.0000 mg | ORAL_TABLET | ORAL | Status: DC | PRN
  Administered 2013-05-20 – 2013-05-21 (×3): 10 mg via ORAL
  Filled 2013-05-20 (×3): qty 3

## 2013-05-20 NOTE — Evaluation (Signed)
Physical Therapy Evaluation Patient Details Name: [name] MRN: [id number] DOB: 07/02/1936 Today's Date: 05/20/2013 Time: 1203-1300 PT Time Calculation (min): 57 min  PT Assessment / Plan / Recommendation History of Present Illness  Pt s/p lumbar fusion L4-5, L5-S1.  Pt was on bedrest for POD 1.  Clinical Impression  Pt needs skilled PT to maximize Indep and safety before eventual return home.  Recommend CIR consult.  If pt not accepted to CIR recommend back-up of ST[facility].  Pt with bloody drainage from incision today with nurse changing dressing while we had pt EOB.  After amb and trip to bathroom pt continued to have bloody drainage and was returned to bed and nurse aware.    PT Assessment  Patient needs continued PT services    Follow Up Recommendations  CIR    Does the patient have the potential to tolerate intense rehabilitation      Barriers to Discharge        Equipment Recommendations  None recommended by PT    Recommendations for Other Services     Frequency Min 5X/week    Precautions / Restrictions Precautions Precautions: Back;Fall Required Braces or Orthoses: Spinal Brace Spinal Brace: Lumbar corset;Applied in sitting position   Pertinent Vitals/Pain See flow sheet      Mobility  Bed Mobility Bed Mobility: Rolling Left;Left Sidelying to Sit;Sitting - Scoot to Edge of Bed;Sit to Sidelying Left Rolling Left: 1: +2 Total assist Rolling Left: Patient Percentage: 50% Left Sidelying to Sit: 1: +2 Total assist;HOB flat;With rails Left Sidelying to Sit: Patient Percentage: 50% Sitting - Scoot to Edge of Bed: 1: +2 Total assist Sitting - Scoot to Edge of Bed: Patient Percentage: 60% Sit to Sidelying Left: 1: +2 Total assist Sit to Sidelying Left: Patient Percentage: 60% Details for Bed Mobility Assistance: Assist to move legs and to raise or lower trunk.  Verbal cues for technique. Transfers Transfers: Sit to Stand;Stand to Sit Sit to Stand: 1: +2 Total  assist;With upper extremity assist;With armrests;From bed;From chair/3-in-1;From toilet Sit to Stand: Patient Percentage: 60% Stand to Sit: 3: Mod assist;With upper extremity assist;With armrests;To bed;To chair/3-in-1;To toilet Details for Transfer Assistance: Verbal cues for hand placement.  Assist to bring hips up and to stand more erect. Ambulation/Gait Ambulation/Gait Assistance: 1: +2 Total assist Ambulation/Gait: Patient Percentage: 70% Ambulation Distance (Feet): 100 Feet Assistive device: Rolling walker Ambulation/Gait Assistance Details: Verbal/tactile cues for pt to stay closer to the walker and to stand more erect.  Pt amb with forward flexion of trunk over the hips. Gait Pattern: Step-through pattern;Decreased step length - right;Decreased step length - left;Trunk flexed;Shuffle Gait velocity: decr    Exercises     PT Diagnosis: Difficulty walking;Generalized weakness;Acute pain;Altered mental status  PT Problem List: Decreased strength;Decreased activity tolerance;Decreased balance;Decreased mobility;Decreased knowledge of use of DME;Decreased knowledge of precautions;Decreased cognition PT Treatment Interventions: DME instruction;Gait training;Functional mobility training;Therapeutic activities;Balance training;Patient/family education     PT Goals(Current goals can be found in the care plan section) Acute Rehab PT Goals Patient Stated Goal: Go home PT Goal Formulation: With patient Time For Goal Achievement: 05/27/13 Potential to Achieve Goals: Good  Visit Information  Last PT Received On: 05/20/13 Assistance Needed: +2 History of Present Illness: Pt s/p lumbar fusion L4-5, L5-S1.  Pt was on bedrest for POD 1.       Prior Functioning  Home Living Family/patient expects to be discharged to:: Private residence Living Arrangements: Spouse/significant other Available Help at Discharge: Family Type of Home: [facility]  Access: Stairs to enter [employer]  of Steps: 1 Home Layout: One level Home Equipment: Walker - 2 wheels;Cane - single point;Shower seat;Hand held shower head Prior Function Level of Independence: Independent with assistive device(s) Comments: using straight cane    Cognition  Cognition Arousal/Alertness: Awake/alert Behavior During Therapy: WFL for tasks assessed/performed Overall Cognitive Status: Impaired/Different from baseline Area of Impairment: Orientation;Safety/judgement;Problem solving Orientation Level: Time Memory: Decreased short-term memory;Decreased recall of precautions Safety/Judgement: Decreased awareness of safety Problem Solving: Slow processing;Requires verbal cues;Requires tactile cues General Comments: Became confused on medication    Extremity/Trunk Assessment Upper Extremity Assessment Upper Extremity Assessment: Defer to OT evaluation Lower Extremity Assessment Lower Extremity Assessment: Generalized weakness;RLE deficits/detail RLE Sensation: decreased light touch   Balance Balance Balance Assessed: Yes Static Sitting Balance Static Sitting - Balance Support: Bilateral upper extremity supported Static Sitting - Level of Assistance: 3: Mod assist;5: Stand by assistance Static Sitting - Comment/# of Minutes: Initially required mod A due to posterior lean but eventually improved and only needed stand by.  Pt sat EOB x 10-15 minutes due to back dressing needing to be changed, incontinence of urine, use of urinal and other personal needs. Static Standing Balance Static Standing - Balance Support: Bilateral upper extremity supported Static Standing - Level of Assistance: 4: Min assist Static Standing - Comment/# of Minutes: Stood 4-5 different times due to multiple personal needs. Stood for 1-2 minutes each time.  End of Session PT - End of Session Equipment Utilized During Treatment: Gait belt;Back brace Activity Tolerance: Patient tolerated treatment well Patient left: in bed;with call  bell/phone within reach;with family/visitor present Nurse Communication: Mobility status;Other (comment) (bloody drainage from incision)  GP     Alixis Harmon 05/20/2013, 2:06 PM  [facility] PT [phone]

## 2013-05-20 NOTE — Progress Notes (Signed)
Patient is confused/hallucinating, morphine PCA held. Will continue to monitor.

## 2013-05-20 NOTE — Progress Notes (Addendum)
Pt oob today with therapist and started bleeding from distal end of surgical incision Dressing changed x 3 already using aseptic technique . ABD pad x2 applied Per MD order. Md already aware of the bleeding.  SQ heparin for 2 pm held  SCD to Bil  lower extremities in use  Pt made comfortable in bed at this time. RN will continue to monitor.

## 2013-05-20 NOTE — Progress Notes (Signed)
PT PCA morphine d/c pt used 0.329 at the time of d/c and 5mg  wasted and witnessed by  Ms Andree Coss  RN unable to waste in in the pyxis because  mophine PCA not listed at time of waste.. Foley discontinued.

## 2013-05-20 NOTE — Evaluation (Signed)
Occupational Therapy Evaluation Patient Details Name: [name] MRN: [id number] DOB: [birth year] Today's Date: 05/20/2013 Time: 9528-4132 OT Time Calculation (min): 21 min  OT Assessment / Plan / Recommendation History of present illness Pt s/p lumbar fusion L4-5, L5-S1.  Pt was on bedrest for POD 1.   Clinical Impression   Patient is s/p L4-5 L5-S1 fusion surgery resulting in functional limitations due to the deficits listed below (see OT problem list). Bedrest for 24 hours. Patient will benefit from skilled OT acutely to increase independence and safety with ADLS to allow discharge CIR.     OT Assessment  Patient needs continued OT Services    Follow Up Recommendations  CIR    Barriers to Discharge      Equipment Recommendations  Other (comment) (defer to CIR)    Recommendations for Other Services Rehab consult  Frequency  Min 2X/week    Precautions / Restrictions Precautions Precautions: Back;Fall Required Braces or Orthoses: Spinal Brace Spinal Brace: Lumbar corset;Applied in sitting position   Pertinent Vitals/Pain  5 out 10 supine    ADL  Grooming: Minimal assistance Where Assessed - Grooming: Supine, head of bed up Lower Body Bathing: +1 Total assistance Where Assessed - Lower Body Bathing: Rolling right and/or left Lower Body Dressing: +1 Total assistance Where Assessed - Lower Body Dressing: Supine, head of bed up ADL Comments: Pt supine in bed voiding bowel. Pt log rolled right and left for hygiene. pt with constant voiding bowel. Pt repositioned in bed. Pt required two person assist to achieve enough side lying for peri care.    OT Diagnosis: Generalized weakness;Acute pain  OT Problem List: Decreased strength;Decreased activity tolerance;Impaired balance (sitting and/or standing);Decreased knowledge of precautions;Decreased knowledge of use of DME or AE;Decreased safety awareness;Pain OT Treatment Interventions: Self-care/ADL training;Therapeutic  exercise;DME and/or AE instruction;Therapeutic activities;Patient/family education;Balance training   OT Goals(Current goals can be found in the care plan section) Acute Rehab OT Goals Patient Stated Goal: Go home OT Goal Formulation: With patient/family Time For Goal Achievement: 06/03/13 Potential to Achieve Goals: Good ADL Goals Pt Will Perform Grooming: with supervision;sitting Pt Will Perform Upper Body Bathing: with supervision;sitting Pt Will Transfer to Toilet: with mod assist;bedside commode Additional ADL Goal #1: Pt will don doff back brace MOD I  Visit Information  Last OT Received On: 05/20/13 Assistance Needed: +2 History of Present Illness: Pt s/p lumbar fusion L4-5, L5-S1.  Pt was on bedrest for POD 1.       Prior Functioning     Home Living Family/patient expects to be discharged to:: Private residence Living Arrangements: Spouse/significant other Available Help at Discharge: Family Type of Home: House Home Access: Stairs to enter Secretary/administrator of Steps: 1 Home Layout: One level Home Equipment: Environmental consultant - 2 wheels;Cane - single point;Shower seat;Hand held shower head Prior Function Level of Independence: Independent with assistive device(s) Comments: using straight cane Communication Communication: No difficulties Dominant Hand: Right         Vision/Perception     Cognition  Cognition Arousal/Alertness: Awake/alert Behavior During Therapy: WFL for tasks assessed/performed Overall Cognitive Status: Impaired/Different from baseline Area of Impairment: Safety/judgement;Problem solving Memory: Decreased short-term memory;Decreased recall of precautions Safety/Judgement: Decreased awareness of safety Problem Solving: Slow processing;Requires verbal cues;Requires tactile cues    Extremity/Trunk Assessment Upper Extremity Assessment Upper Extremity Assessment: Generalized weakness Lower Extremity Assessment Lower Extremity Assessment: Defer  to PT evaluation RLE Sensation: decreased light touch     Mobility Bed Mobility Bed Mobility: Rolling Right;Rolling Left Rolling Right:  2: Max assist;With rail Rolling Left: 2: Max assist;With rail Rolling Left: Patient Percentage: 50% Left Sidelying to Sit: 1: +2 Total assist;HOB flat;With rails Left Sidelying to Sit: Patient Percentage: 50% Sitting - Scoot to Edge of Bed: 1: +2 Total assist Sitting - Scoot to Edge of Bed: Patient Percentage: 60% Sit to Sidelying Left: 1: +2 Total assist Sit to Sidelying Left: Patient Percentage: 60% Details for Bed Mobility Assistance: pt using bil UE on bed rail. Pt limited to bed level at this time due to constant void of bowels Transfers Transfers: Not assessed Sit to Stand: 1: +2 Total assist;With upper extremity assist;With armrests;From bed;From chair/3-in-1;From toilet Sit to Stand: Patient Percentage: 60% Stand to Sit: 3: Mod assist;With upper extremity assist;With armrests;To bed;To chair/3-in-1;To toilet Details for Transfer Assistance: Verbal cues for hand placement.  Assist to bring hips up and to stand more erect.     Exercise     Balance Balance Balance Assessed: Yes Static Sitting Balance Static Sitting - Balance Support: Bilateral upper extremity supported Static Sitting - Level of Assistance: 3: Mod assist;5: Stand by assistance Static Sitting - Comment/# of Minutes: Initially required mod A due to posterior lean but eventually improved and only needed stand by.  Pt sat EOB x 10-15 minutes due to back dressing needing to be changed, incontinence of urine, use of urinal and other personal needs. Static Standing Balance Static Standing - Balance Support: Bilateral upper extremity supported Static Standing - Level of Assistance: 4: Min assist Static Standing - Comment/# of Minutes: Stood 4-5 different times due to multiple personal needs. Stood for 1-2 minutes each time.   End of Session OT - End of Session Activity Tolerance:  Patient tolerated treatment well Patient left: in bed;with call bell/phone within reach Nurse Communication: Mobility status;Precautions  GO     Harolyn Rutherford 05/20/2013, 4:48 PM Pager: [phone]

## 2013-05-20 NOTE — Consult Note (Signed)
Physical Medicine and Rehabilitation Consult   Reason for Consult: Low back pain radiating to BLE Referring Physician:  Dr. Jeral Fruit.    HPI: [name] is a 77 y.o. male with history of HTN, prostate cancer, diffuse idiopathic skeletal hyperostosis s/p L3/4 fusion who has had LBP radiating to BLE and limiting ambulation. Work up with L4/5 and L5/S1 stenosis with HNP and patient elected to undergo removal of previous L3-L4 screws with bilateral lam L4/5 and L5-S1 diskectomy with screws L4-S1. Post op with fevers. PT evaluation done today and CIR recommended by PT, MD.  PCA discontinued this morning. Tolerating oral pain medications. Bowel incontinence today. Bladder is continent  Review of Systems  HENT: Negative for hearing loss.   Eyes: Positive for double vision (for the past month).  Respiratory: Positive for shortness of breath.   Cardiovascular: Negative for chest pain and palpitations.  Gastrointestinal: Positive for diarrhea (Incontinent since surgery). Negative for heartburn and nausea.  Genitourinary: Positive for urgency and frequency.       Urge incontinence?  Musculoskeletal: Positive for back pain, joint pain and myalgias.  Neurological: Positive for sensory change (numbness RLE) and weakness. Negative for headaches.   Past Medical History  Diagnosis Date  . Prostate cancer     hx of prostate ca  . Hypertension   . Thyroid disease   . Hypothyroidism   . Cough     due to scarring of lung  . GERD (gastroesophageal reflux disease)   . Arthritis   . Complication of anesthesia     sometimes have trouble intubating not last 2 times; limited neck mobility--glidescope used in '08 and '10   Past Surgical History  Procedure Laterality Date  . Lung surgery Right 09    growth on outside of lung  . Tonsillectomy    . Appendectomy    . Shoulder surgery Left     rotator cuff x2  . Back surgery    . Cervical spine surgery    . Knee arthroscopy Right   . Eye surgery  Bilateral 10   History reviewed. No pertinent family history.  Social History:  Married. 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 He reports that he quit smoking about 31 years ago. His smoking use included Cigarettes. He has a 45 pack-year smoking history. He does not have any smokeless tobacco history on file. He reports that he does not drink alcohol or use illicit drugs.  Allergies: No Known Allergies  Medications Prior to Admission  Medication Sig Dispense Refill  . amLODipine (NORVASC) 5 MG tablet Take 10 mg by mouth daily. Hold if SBP <100      . benzonatate (TESSALON) 100 MG capsule Take 100 mg by mouth 2 (two) times daily.      . diazepam (VALIUM) 5 MG tablet Take 5 mg by mouth 2 (two) times daily as needed for anxiety.      . DULoxetine (CYMBALTA) 60 MG capsule Take 60 mg by mouth daily.      Marland Kitchen dutasteride (AVODART) 0.5 MG capsule Take 0.5 mg by mouth daily.      . fluticasone (FLONASE) 50 MCG/ACT nasal spray Place 2 sprays into the nose daily.      Marland Kitchen guaiFENesin (MUCINEX) 600 MG 12 hr tablet Take 600 mg by mouth 2 (two) times daily.      Marland Kitchen ibuprofen (ADVIL,MOTRIN) 800 MG tablet Take 800 mg by mouth every 8 (eight) hours as needed for pain.      Marland Kitchen  levothyroxine (SYNTHROID, LEVOTHROID) 50 MCG tablet Take 50 mcg by mouth See admin instructions. Take on Mondays, Tuesdays, Wednesdays, Thursdays, and Fridays.      Marland Kitchen levothyroxine (SYNTHROID, LEVOTHROID) 75 MCG tablet Take 75 mcg by mouth See admin instructions. Take on Saturdays and Sundays only.      Marland Kitchen LORazepam (ATIVAN) 1 MG tablet Take 1 mg by mouth 2 (two) times daily.      . metoprolol tartrate (LOPRESSOR) 25 MG tablet Take 25 mg by mouth 2 (two) times daily.      Marland Kitchen omeprazole (PRILOSEC) 20 MG capsule Take 20 mg by mouth 2 (two) times daily.      Marland Kitchen oxyCODONE (ROXICODONE) 15 MG immediate release tablet Take 30 mg by mouth every 6 (six) hours as needed. For pain.      Marland Kitchen senna (SENOKOT) 8.6 MG TABS Take 2  tablets by mouth at bedtime.      . solifenacin (VESICARE) 10 MG tablet Take 10 mg by mouth daily.      . Vitamin D, Ergocalciferol, (DRISDOL) 50000 UNITS CAPS Take 50,000 Units by mouth every 7 (seven) days. Patient tries to take on Mondays or Tuesdays.      Marland Kitchen imipramine (TOFRANIL) 25 MG tablet Take 25 mg by mouth 2 (two) times daily.        Home: Home Living Family/patient expects to be discharged to:: Private residence Living Arrangements: Spouse/significant other  Functional History:   Functional Status:  Mobility:          ADL:    Cognition: Cognition Orientation Level: Oriented to person;Oriented to place;Disoriented to time;Oriented to situation    Blood pressure 153/75, pulse 96, temperature 98.2 F (36.8 C), temperature source Oral, resp. rate 18, SpO2 97.00%. Physical Exam  Nursing note and vitals reviewed. Constitutional: He is oriented to person, place, and time. He appears well-developed and well-nourished.  HENT:  Head: Normocephalic and atraumatic.  Eyes: Conjunctivae are normal. Pupils are equal, round, and reactive to light.  Neck: Normal range of motion. Neck supple.  Cardiovascular: Normal rate.   Respiratory: Breath sounds normal. No respiratory distress. He has no wheezes.  GI: Soft. Bowel sounds are normal. He exhibits no distension. There is no tenderness.  Musculoskeletal: He exhibits no edema.  Neurological: He is alert and oriented to person, place, and time.  Skin: Skin is warm and dry.   sensory: Decreased proprioception left great toe, decreased light touch sensation left great toe. Intact sensation bilateral S1 Motor strength 5/5 in bilateral deltoid, bicep, tricep, grip 3 minus bilateral hip flexors knee extensors ankle dorsiflexors and plantar flexors  No results found for this or any previous visit (from the past 24 hour(s)). Dg Chest Port 1 View  05/19/2013   CLINICAL DATA:  Fever  EXAM: PORTABLE CHEST - 1 VIEW  COMPARISON:  05/10/2013   FINDINGS: Mild cardiomegaly. Bibasilar atelectasis. No pneumothorax or pleural effusion.  IMPRESSION: Bibasilar atelectasis. Cardiomegaly without pulmonary edema.   Electronically Signed   By: Maryclare Bean M.D.   On: 05/19/2013 18:40    Assessment/Plan: Diagnosis: Lumbar spinal stenosis with neurogenic claudication status post decompression and fusion postoperative day #2 1. Does the need for close, 24 hr/day medical supervision in concert with the patient's rehab needs make it unreasonable for this patient to be served in a less intensive setting? Yes 2. Co-Morbidities requiring supervision/potential complications: Hypertension, postoperative confusion, pain management 3. Due to bowel management, safety, skin/wound care, disease management, medication administration, pain management and patient education, does  the patient require 24 hr/day rehab nursing? Yes 4. Does the patient require coordinated care of a physician, rehab nurse, PT (1-2 hrs/day, 5 days/week), OT (1-2 hrs/day, 5 days/week) and SLP (0.5-1 hrs/day, 3 days/week) to address physical and functional deficits in the context of the above medical diagnosis(es)? Yes Addressing deficits in the following areas: balance, endurance, locomotion, strength, transferring, bowel/bladder control, bathing, dressing, feeding, grooming, toileting and cognition 5. Can the patient actively participate in an intensive therapy program of at least 3 hrs of therapy per day at least 5 days per week? Yes 6. The potential for patient to make measurable gains while on inpatient rehab is good 7. Anticipated functional outcomes upon discharge from inpatient rehab are supervision mobility with PT, supervision ADLs with OT, evaluate cognition with SLP. 8. Estimated rehab length of stay to reach the above functional goals is: 1-2 weeks 9. Does the patient have adequate social supports to accommodate these discharge functional goals? Yes 10. Anticipated D/C setting:  Home 11. Anticipated post D/C treatments: HH therapy 12. Overall Rehab/Functional Prognosis: good  RECOMMENDATIONS: This patient's condition is appropriate for continued rehabilitative care in the following setting: CIR Patient has agreed to participate in recommended program. Yes Note that insurance prior authorization may be required for reimbursement for recommended care.  Comment: Expect mental status to clear up once pain medication doses are reduced    05/20/2013

## 2013-05-20 NOTE — Progress Notes (Signed)
Patient ID: [name], male   DOB: 1936/07/06, 77 y.o.   MRN: [id number] Increase of temp. Chest atelectasis. No ha. Had a bm . Plan oob.

## 2013-05-21 ENCOUNTER — Inpatient Hospital Stay (HOSPITAL_COMMUNITY)
Admission: RE | Admit: 2013-05-21 | Discharge: 2013-05-29 | DRG: 945 | Disposition: A | Discharge status: Home Health | Payer: Medicare Other | Source: Intra-hospital | Attending: Physical Medicine & Rehabilitation | Admitting: Physical Medicine & Rehabilitation

## 2013-05-21 DIAGNOSIS — N390 Urinary tract infection, site not specified: Secondary | ICD-10-CM | POA: Diagnosis not present

## 2013-05-21 DIAGNOSIS — Z87891 Personal history of nicotine dependence: Secondary | ICD-10-CM

## 2013-05-21 DIAGNOSIS — K5900 Constipation, unspecified: Secondary | ICD-10-CM | POA: Diagnosis not present

## 2013-05-21 DIAGNOSIS — I10 Essential (primary) hypertension: Secondary | ICD-10-CM | POA: Diagnosis present

## 2013-05-21 DIAGNOSIS — Q762 Congenital spondylolisthesis: Secondary | ICD-10-CM

## 2013-05-21 DIAGNOSIS — E039 Hypothyroidism, unspecified: Secondary | ICD-10-CM | POA: Diagnosis present

## 2013-05-21 DIAGNOSIS — R05 Cough: Secondary | ICD-10-CM | POA: Diagnosis present

## 2013-05-21 DIAGNOSIS — M948X9 Other specified disorders of cartilage, unspecified sites: Secondary | ICD-10-CM | POA: Diagnosis present

## 2013-05-21 DIAGNOSIS — Z981 Arthrodesis status: Secondary | ICD-10-CM

## 2013-05-21 DIAGNOSIS — IMO0002 Reserved for concepts with insufficient information to code with codable children: Secondary | ICD-10-CM

## 2013-05-21 DIAGNOSIS — M5106 Intervertebral disc disorders with myelopathy, lumbar region: Secondary | ICD-10-CM | POA: Diagnosis present

## 2013-05-21 DIAGNOSIS — B9689 Other specified bacterial agents as the cause of diseases classified elsewhere: Secondary | ICD-10-CM | POA: Diagnosis not present

## 2013-05-21 DIAGNOSIS — Z5189 Encounter for other specified aftercare: Principal | ICD-10-CM

## 2013-05-21 DIAGNOSIS — M47817 Spondylosis without myelopathy or radiculopathy, lumbosacral region: Secondary | ICD-10-CM

## 2013-05-21 DIAGNOSIS — R339 Retention of urine, unspecified: Secondary | ICD-10-CM | POA: Diagnosis not present

## 2013-05-21 DIAGNOSIS — K219 Gastro-esophageal reflux disease without esophagitis: Secondary | ICD-10-CM | POA: Diagnosis present

## 2013-05-21 DIAGNOSIS — R197 Diarrhea, unspecified: Secondary | ICD-10-CM | POA: Diagnosis present

## 2013-05-21 DIAGNOSIS — M5126 Other intervertebral disc displacement, lumbar region: Secondary | ICD-10-CM | POA: Diagnosis present

## 2013-05-21 DIAGNOSIS — Z8546 Personal history of malignant neoplasm of prostate: Secondary | ICD-10-CM

## 2013-05-21 MED ORDER — DUTASTERIDE 0.5 MG PO CAPS
0.5000 mg | ORAL_CAPSULE | Freq: Every day | ORAL | Status: DC
  Administered 2013-05-22 – 2013-05-29 (×8): 0.5 mg via ORAL
  Filled 2013-05-21 (×9): qty 1

## 2013-05-21 MED ORDER — LEVOTHYROXINE SODIUM 75 MCG PO TABS
75.0000 ug | ORAL_TABLET | ORAL | Status: DC
  Administered 2013-05-22 – 2013-05-29 (×3): 75 ug via ORAL
  Filled 2013-05-21 (×4): qty 1

## 2013-05-21 MED ORDER — FLUTICASONE PROPIONATE 50 MCG/ACT NA SUSP
1.0000 | Freq: Every day | NASAL | Status: DC
  Administered 2013-05-24 – 2013-05-29 (×6): 1 via NASAL
  Filled 2013-05-21 (×2): qty 16

## 2013-05-21 MED ORDER — SACCHAROMYCES BOULARDII 250 MG PO CAPS
250.0000 mg | ORAL_CAPSULE | Freq: Two times a day (BID) | ORAL | Status: DC
  Administered 2013-05-21 – 2013-05-28 (×15): 250 mg via ORAL
  Filled 2013-05-21 (×18): qty 1

## 2013-05-21 MED ORDER — ENOXAPARIN SODIUM 40 MG/0.4ML ~~LOC~~ SOLN
40.0000 mg | SUBCUTANEOUS | Status: DC
  Administered 2013-05-22 – 2013-05-29 (×8): 40 mg via SUBCUTANEOUS
  Filled 2013-05-21 (×8): qty 0.4

## 2013-05-21 MED ORDER — DULOXETINE HCL 60 MG PO CPEP
60.0000 mg | ORAL_CAPSULE | Freq: Every day | ORAL | Status: DC
  Administered 2013-05-22 – 2013-05-29 (×8): 60 mg via ORAL
  Filled 2013-05-21 (×9): qty 1

## 2013-05-21 MED ORDER — BISACODYL 10 MG RE SUPP
10.0000 mg | Freq: Every day | RECTAL | Status: DC | PRN
  Filled 2013-05-21: qty 1

## 2013-05-21 MED ORDER — PROCHLORPERAZINE MALEATE 5 MG PO TABS
5.0000 mg | ORAL_TABLET | Freq: Four times a day (QID) | ORAL | Status: DC | PRN
  Filled 2013-05-21: qty 2

## 2013-05-21 MED ORDER — GUAIFENESIN ER 600 MG PO TB12
600.0000 mg | ORAL_TABLET | Freq: Two times a day (BID) | ORAL | Status: DC
  Administered 2013-05-21 – 2013-05-29 (×16): 600 mg via ORAL
  Filled 2013-05-21 (×18): qty 1

## 2013-05-21 MED ORDER — BENZONATATE 100 MG PO CAPS
100.0000 mg | ORAL_CAPSULE | Freq: Two times a day (BID) | ORAL | Status: DC
  Administered 2013-05-21 – 2013-05-28 (×15): 100 mg via ORAL
  Filled 2013-05-21 (×20): qty 1

## 2013-05-21 MED ORDER — GUAIFENESIN-DM 100-10 MG/5ML PO SYRP
5.0000 mL | ORAL_SOLUTION | Freq: Four times a day (QID) | ORAL | Status: DC | PRN
  Administered 2013-05-27: 10 mL via ORAL
  Filled 2013-05-21: qty 10

## 2013-05-21 MED ORDER — AMLODIPINE BESYLATE 10 MG PO TABS
10.0000 mg | ORAL_TABLET | Freq: Every day | ORAL | Status: DC
  Administered 2013-05-22 – 2013-05-29 (×8): 10 mg via ORAL
  Filled 2013-05-21 (×9): qty 1

## 2013-05-21 MED ORDER — METHOCARBAMOL 500 MG PO TABS
500.0000 mg | ORAL_TABLET | Freq: Four times a day (QID) | ORAL | Status: DC | PRN
  Filled 2013-05-21: qty 1

## 2013-05-21 MED ORDER — METOPROLOL TARTRATE 25 MG PO TABS
25.0000 mg | ORAL_TABLET | Freq: Two times a day (BID) | ORAL | Status: DC
Start: 2013-05-21 — End: 2013-05-29
  Administered 2013-05-21 – 2013-05-29 (×16): 25 mg via ORAL
  Filled 2013-05-21 (×18): qty 1

## 2013-05-21 MED ORDER — ACETAMINOPHEN 325 MG PO TABS: 325.0000 mg | ORAL_TABLET | ORAL | Status: DC | PRN

## 2013-05-21 MED ORDER — PROCHLORPERAZINE EDISYLATE 5 MG/ML IJ SOLN
5.0000 mg | Freq: Four times a day (QID) | INTRAMUSCULAR | Status: DC | PRN
  Filled 2013-05-21: qty 2

## 2013-05-21 MED ORDER — TRAZODONE HCL 50 MG PO TABS
25.0000 mg | ORAL_TABLET | Freq: Every evening | ORAL | Status: DC | PRN
  Administered 2013-05-23 – 2013-05-28 (×6): 50 mg via ORAL
  Filled 2013-05-21 (×6): qty 1

## 2013-05-21 MED ORDER — DARIFENACIN HYDROBROMIDE ER 7.5 MG PO TB24
7.5000 mg | ORAL_TABLET | Freq: Every day | ORAL | Status: DC
  Administered 2013-05-22 – 2013-05-25 (×4): 7.5 mg via ORAL
  Filled 2013-05-21 (×5): qty 1

## 2013-05-21 MED ORDER — OXYCODONE HCL 5 MG PO TABS
15.0000 mg | ORAL_TABLET | ORAL | Status: DC | PRN
  Administered 2013-05-21 – 2013-05-24 (×8): 15 mg via ORAL
  Filled 2013-05-21 (×8): qty 3

## 2013-05-21 MED ORDER — ALUM & MAG HYDROXIDE-SIMETH 200-200-20 MG/5ML PO SUSP: 30.0000 mL | ORAL | Status: DC | PRN

## 2013-05-21 MED ORDER — PROCHLORPERAZINE 25 MG RE SUPP
12.5000 mg | Freq: Four times a day (QID) | RECTAL | Status: DC | PRN
  Filled 2013-05-21: qty 1

## 2013-05-21 MED ORDER — MENTHOL 3 MG MT LOZG
1.0000 | LOZENGE | OROMUCOSAL | Status: DC | PRN
  Filled 2013-05-21: qty 9

## 2013-05-21 MED ORDER — PHENOL 1.4 % MT LIQD
1.0000 | OROMUCOSAL | Status: DC | PRN
  Filled 2013-05-21: qty 177

## 2013-05-21 MED ORDER — LORAZEPAM 1 MG PO TABS
1.0000 mg | ORAL_TABLET | Freq: Two times a day (BID) | ORAL | Status: DC
  Administered 2013-05-21 – 2013-05-24 (×6): 1 mg via ORAL
  Filled 2013-05-21 (×6): qty 1

## 2013-05-21 MED ORDER — FLEET ENEMA 7-19 GM/118ML RE ENEM: 1.0000 | ENEMA | Freq: Once | RECTAL | Status: AC | PRN

## 2013-05-21 MED ORDER — LEVOTHYROXINE SODIUM 50 MCG PO TABS
50.0000 ug | ORAL_TABLET | ORAL | Status: DC
  Administered 2013-05-24 – 2013-05-28 (×5): 50 ug via ORAL
  Filled 2013-05-21 (×5): qty 1

## 2013-05-21 MED ORDER — DIPHENHYDRAMINE HCL 12.5 MG/5ML PO ELIX: 12.5000 mg | ORAL_SOLUTION | Freq: Four times a day (QID) | ORAL | Status: DC | PRN

## 2013-05-21 MED ORDER — PANTOPRAZOLE SODIUM 20 MG PO TBEC
20.0000 mg | DELAYED_RELEASE_TABLET | Freq: Two times a day (BID) | ORAL | Status: DC
  Administered 2013-05-21 – 2013-05-29 (×16): 20 mg via ORAL
  Filled 2013-05-21 (×20): qty 1

## 2013-05-21 NOTE — H&P (Signed)
Physical Medicine and Rehabilitation Admission H&P    CC: Back pain radiating to BLE  HPI: [name] is a 77 y.o. male with history of HTN, prostate cancer, diffuse idiopathic skeletal hyperostosis s/p L3/4 fusion who has had LBP radiating to BLE and limiting ambulation. Work up with L4/5 and L5/S1 stenosis with HNP and patient elected to undergo removal of previous L3-L4 screws with bilateral lam L4/5 and L5-S1 diskectomy with screws L4-S1. Post op with fevers as well as diarrhea with incontinence. Therapies initiated and CIR recommended by team.    Review of Systems  HENT: Negative for hearing loss.   Eyes: Positive for double vision (for the past month. ).  Respiratory: Positive for cough (chronic--takes multiple medications. ).   Cardiovascular: Negative for chest pain and palpitations.  Gastrointestinal: Positive for heartburn and diarrhea.  Genitourinary: Positive for urgency and frequency.       Chronic incontinence   Musculoskeletal: Positive for myalgias.  Neurological: Positive for speech change (dysphonia since neck surgery). Negative for headaches.   Past Medical History  Diagnosis Date  . Prostate cancer     hx of prostate ca  . Hypertension   . Thyroid disease   . Hypothyroidism   . Cough     due to scarring of lung  . GERD (gastroesophageal reflux disease)   . Arthritis   . Complication of anesthesia     sometimes have trouble intubating not last 2 times; limited neck mobility--glidescope used in '08 and '10   Past Surgical History  Procedure Laterality Date  . Lung surgery Right 09    growth on outside of lung  . Tonsillectomy    . Appendectomy    . Shoulder surgery Left     rotator cuff x2  . Back surgery    . Cervical spine surgery    . Knee arthroscopy Right   . Eye surgery Bilateral 10   History reviewed. No pertinent family history.  Social History: Married. 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 He reports that  he quit smoking about 31 years ago. His smoking use included Cigarettes. He has a 45 pack-year smoking history. He does not have any smokeless tobacco history on file. He reports that he does not drink alcohol or use illicit drugs.   Allergies: No Known Allergies  Medications Prior to Admission  Medication Sig Dispense Refill  . amLODipine (NORVASC) 5 MG tablet Take 10 mg by mouth daily. Hold if SBP <100      . benzonatate (TESSALON) 100 MG capsule Take 100 mg by mouth 2 (two) times daily.      . diazepam (VALIUM) 5 MG tablet Take 5 mg by mouth 2 (two) times daily as needed for anxiety.      . DULoxetine (CYMBALTA) 60 MG capsule Take 60 mg by mouth daily.      Marland Kitchen dutasteride (AVODART) 0.5 MG capsule Take 0.5 mg by mouth daily.      . fluticasone (FLONASE) 50 MCG/ACT nasal spray Place 2 sprays into the nose daily.      Marland Kitchen guaiFENesin (MUCINEX) 600 MG 12 hr tablet Take 600 mg by mouth 2 (two) times daily.      Marland Kitchen ibuprofen (ADVIL,MOTRIN) 800 MG tablet Take 800 mg by mouth every 8 (eight) hours as needed for pain.      Marland Kitchen levothyroxine (SYNTHROID, LEVOTHROID) 50 MCG tablet Take 50 mcg by mouth See admin instructions. Take on Mondays, Tuesdays, Wednesdays, Thursdays, and  Fridays.      Marland Kitchen levothyroxine (SYNTHROID, LEVOTHROID) 75 MCG tablet Take 75 mcg by mouth See admin instructions. Take on Saturdays and Sundays only.      Marland Kitchen LORazepam (ATIVAN) 1 MG tablet Take 1 mg by mouth 2 (two) times daily.      . metoprolol tartrate (LOPRESSOR) 25 MG tablet Take 25 mg by mouth 2 (two) times daily.      Marland Kitchen omeprazole (PRILOSEC) 20 MG capsule Take 20 mg by mouth 2 (two) times daily.      Marland Kitchen oxyCODONE (ROXICODONE) 15 MG immediate release tablet Take 30 mg by mouth every 6 (six) hours as needed. For pain.      Marland Kitchen senna (SENOKOT) 8.6 MG TABS Take 2 tablets by mouth at bedtime.      . solifenacin (VESICARE) 10 MG tablet Take 10 mg by mouth daily.      . Vitamin D, Ergocalciferol, (DRISDOL) 50000 UNITS CAPS Take 50,000  Units by mouth every 7 (seven) days. Patient tries to take on Mondays or Tuesdays.      Marland Kitchen imipramine (TOFRANIL) 25 MG tablet Take 25 mg by mouth 2 (two) times daily.        Home: Home Living Family/patient expects to be discharged to:: Private residence Living Arrangements: Spouse/significant other Available Help at Discharge: Family Type of Home: House Home Access: Stairs to enter Secretary/administrator of Steps: 1 Home Layout: One level Home Equipment: Environmental consultant - 2 wheels;Cane - single point;Shower seat;Hand held shower head   Functional History: Prior Function Comments: using straight cane  Functional Status:  Mobility: Bed Mobility Bed Mobility: Not assessed Rolling Right: 2: Max assist;With rail Rolling Left: 2: Max assist;With rail Rolling Left: Patient Percentage: 50% Left Sidelying to Sit: 1: +2 Total assist;HOB flat;With rails Left Sidelying to Sit: Patient Percentage: 50% Sitting - Scoot to Edge of Bed: 1: +2 Total assist Sitting - Scoot to Edge of Bed: Patient Percentage: 60% Sit to Sidelying Left: 1: +2 Total assist Sit to Sidelying Left: Patient Percentage: 60% Transfers Transfers: Sit to Stand;Stand to Sit Sit to Stand: 4: Min assist;From bed;With upper extremity assist Sit to Stand: Patient Percentage: 60% Stand to Sit: With upper extremity assist;To chair/3-in-1;4: Min assist Ambulation/Gait Ambulation/Gait Assistance: 4: Min assist Ambulation/Gait: Patient Percentage: 70% Ambulation Distance (Feet): 120 Feet Assistive device: Rolling walker Ambulation/Gait Assistance Details: Cues to stay close to RW and stand upright. Tends to flex with increased ambulation Gait Pattern: Step-through pattern;Decreased step length - right;Decreased step length - left Gait velocity: decr    ADL: ADL Grooming: Minimal assistance Where Assessed - Grooming: Supine, head of bed up Lower Body Bathing: +1 Total assistance Where Assessed - Lower Body Bathing: Rolling right  and/or left Lower Body Dressing: +1 Total assistance Where Assessed - Lower Body Dressing: Supine, head of bed up ADL Comments: Pt supine in bed voiding bowel. Pt log rolled right and left for hygiene. pt with constant voiding bowel. Pt repositioned in bed. Pt required two person assist to achieve enough side lying for peri care.  Cognition: Cognition Overall Cognitive Status: Within Functional Limits for tasks assessed Orientation Level: Oriented to person;Oriented to place;Disoriented to time;Oriented to situation Cognition Arousal/Alertness: Awake/alert Behavior During Therapy: WFL for tasks assessed/performed Overall Cognitive Status: Within Functional Limits for tasks assessed Area of Impairment: Safety/judgement;Problem solving Orientation Level: Time Memory: Decreased short-term memory;Decreased recall of precautions Safety/Judgement: Decreased awareness of safety Problem Solving: Slow processing;Requires verbal cues;Requires tactile cues General Comments: Became confused on medication  Physical Exam: Blood pressure 122/73, pulse 94, temperature 98.6 F (37 C), temperature source Oral, resp. rate 18, SpO2 93.00%. Physical Exam  Nursing note and vitals reviewed. Constitutional: He is oriented to person, place, and time. He appears well-developed and well-nourished. No distress.  Flat affect.   HENT:  Head: Normocephalic and atraumatic.     Eyes: Conjunctivae are normal. Pupils are equal, round, and reactive to light. No scleral icterus.  Neck: Normal range of motion. Neck supple. No JVD present. No tracheal deviation present. No thyromegaly present.  Cardiovascular: Normal rate and regular rhythm.  Exam reveals no friction rub.   No murmur heard. Respiratory: Effort normal and breath sounds normal. No respiratory distress. He has no rales.  Upper airway rhonchi due to oral secretions.   GI: Soft. Bowel sounds are normal.  Musculoskeletal:  Back tender. Wearing LSO   Lymphadenopathy:    He has no cervical adenopathy.  Neurological: He is alert and oriented to person, place, and time. He displays normal reflexes. No cranial nerve deficit. Coordination abnormal.  Wet, dysphonic voice. Slow, monotonic speech pattern. Follows basic commands without difficulty. Appears to have delayed processing. Masked facies appearance  Decreased proprioception left great toe, decreased light touch sensation left great toe. Intact sensation bilateral S1 Motor strength 5/5 in bilateral deltoid, bicep, tricep, grip 3 minus to 3/5 bilateral hip flexors knee extensors ankle dorsiflexors and plantar flexors with delayed processing and movement. No gross sensory abnormalities seen.      Skin: Skin is warm. He is not diaphoretic.  Back incision intact with staples and large amount of serosanguineous drainage from mid to lower back area--2 soaked ABD pads in place.     No results found for this or any previous visit (from the past 48 hour(s)). Dg Chest Port 1 View  05/19/2013   CLINICAL DATA:  Fever  EXAM: PORTABLE CHEST - 1 VIEW  COMPARISON:  05/10/2013  FINDINGS: Mild cardiomegaly. Bibasilar atelectasis. No pneumothorax or pleural effusion.  IMPRESSION: Bibasilar atelectasis. Cardiomegaly without pulmonary edema.   Electronically Signed   By: Maryclare Bean M.D.   On: 05/19/2013 18:40    Post Admission Physician Evaluation: 1. Functional deficits secondary  to lumbar stenosis with spondylolisthesis and myelopathy. History of DISH and prior fusion . 2. Patient is admitted to receive collaborative, interdisciplinary care between the physiatrist, rehab nursing staff, and therapy team. 3. Patient's level of medical complexity and substantial therapy needs in context of that medical necessity cannot be provided at a lesser intensity of care such as a SNF. 4. Patient has experienced substantial functional loss from his/her baseline which was documented above under the "Functional History"  and "Functional Status" headings.  Judging by the patient's diagnosis, physical exam, and functional history, the patient has potential for functional progress which will result in measurable gains while on inpatient rehab.  These gains will be of substantial and practical use upon discharge  in facilitating mobility and self-care at the household level. 5. Physiatrist will provide 24 hour management of medical needs as well as oversight of the therapy plan/treatment and provide guidance as appropriate regarding the interaction of the two. 6. 24 hour rehab nursing will assist with bladder management, bowel management, safety, skin/wound care, disease management, medication administration, pain management and patient education  and help integrate therapy concepts, techniques,education, etc. 7. PT will assess and treat for/with: Lower extremity strength, range of motion, stamina, balance, functional mobility, safety, adaptive techniques and equipment, NMR, pain mgt, back precautions.  Goals are: supervision to min assist. 8. OT will assess and treat for/with: ADL's, functional mobility, safety, upper extremity strength, adaptive techniques and equipment, NMR, pain mgt, back precautions.   Goals are: min assist. 9. SLP will assess and treat for/with: TBD.  Goals are: TBD---may need an eval. 10. Case Management and Social Worker will assess and treat for psychological issues and discharge planning. 11. Team conference will be held weekly to assess progress toward goals and to determine barriers to discharge. 12. Patient will receive at least 3 hours of therapy per day at least 5 days per week. 13. ELOS: 2-3 weeks       14. Prognosis:  good   Medical Problem List and Plan: 1. DVT Prophylaxis/Anticoagulation: Pharmaceutical: Lovenox 2. Pain Management:  Prn oxycodone effective.  3. Mood:  Resume ativan--uses bid basis. Will discontinue valium. continue cymbalta. LSCW to follow for evaluation.  4.  Neuropsych: This patient is capable of making decisions on his own behalf. He is slow in processing and almost has a parkinsonian appearance. Will monitor.  5. Diarrhea:  Resolving. Will discontinue senna and add probiotic.  6. HTN:  Will monitor with bid checks.  continue Norvasc and metoprolol.  7. BPH/Prostate cancer: Continue Avodart, Enablex--discontinue Flomax, and Tofranil as he does not use these anymore.  8. Hypothyroid: continue supplement.  9. Chronic cough: continue tessalon, mucinex--resume flonsae as well as PPI for chronic GERD which is likely a contributing factor.     Ranelle Oyster, MD, [facility] Physical Medicine & Rehabilitation   05/21/2013

## 2013-05-21 NOTE — Progress Notes (Signed)
I met with pt and his wife at bedside. Discussed inpt rehab venue as an option for his rehab. They are both in agreement. I contacted Dr. Jeral Fruit and he is also in agreement to d/c pt to inpt rehab today. I will arrange. [phone]

## 2013-05-21 NOTE — Progress Notes (Signed)
Patient ID: [name], male   DOB: 09/20/1935, 77 y.o.   MRN: [id number] Better, minimal pain. To rehabiltation unit

## 2013-05-21 NOTE — Discharge Summary (Signed)
Physician Discharge Summary  Patient ID: [name] MRN: [id number] DOB/AGE: February 26, 1936 77 y.o.  Admit date: 05/18/2013 Discharge date: 05/21/2013  Admission Diagnoses:lumbar ddd. spondylolisthesis  Discharge Diagnoses: same Active Problems:   * No active hospital problems. *   Discharged Condition: minimal leg pain.  Hospital Course: surgery  Consults: rehab medicine  Significant Diagnostic Studies: mri  Treatments: l4 to s1 fusion  Discharge Exam: Blood pressure 108/70, pulse 94, temperature 98.1 F (36.7 C), temperature source Oral, resp. rate 18, SpO2 96.00%. No weakness. Incisional pain  Disposition: to rehabilitation medicine     Medication List         amLODipine 5 MG tablet  Commonly known as:  NORVASC  Take 10 mg by mouth daily. Hold if SBP <100     benzonatate 100 MG capsule  Commonly known as:  TESSALON  Take 100 mg by mouth 2 (two) times daily.     diazepam 5 MG tablet  Commonly known as:  VALIUM  Take 5 mg by mouth 2 (two) times daily as needed for anxiety.     DULoxetine 60 MG capsule  Commonly known as:  CYMBALTA  Take 60 mg by mouth daily.     dutasteride 0.5 MG capsule  Commonly known as:  AVODART  Take 0.5 mg by mouth daily.     fluticasone 50 MCG/ACT nasal spray  Commonly known as:  FLONASE  Place 2 sprays into the nose daily.     guaiFENesin 600 MG 12 hr tablet  Commonly known as:  MUCINEX  Take 600 mg by mouth 2 (two) times daily.     ibuprofen 800 MG tablet  Commonly known as:  ADVIL,MOTRIN  Take 800 mg by mouth every 8 (eight) hours as needed for pain.     imipramine 25 MG tablet  Commonly known as:  TOFRANIL  Take 25 mg by mouth 2 (two) times daily.     levothyroxine 75 MCG tablet  Commonly known as:  SYNTHROID, LEVOTHROID  Take 75 mcg by mouth See admin instructions. Take on Saturdays and Sundays only.     levothyroxine 50 MCG tablet  Commonly known as:  SYNTHROID, LEVOTHROID  Take 50 mcg by mouth See admin  instructions. Take on Mondays, Tuesdays, Wednesdays, Thursdays, and Fridays.     LORazepam 1 MG tablet  Commonly known as:  ATIVAN  Take 1 mg by mouth 2 (two) times daily.     metoprolol tartrate 25 MG tablet  Commonly known as:  LOPRESSOR  Take 25 mg by mouth 2 (two) times daily.     omeprazole 20 MG capsule  Commonly known as:  PRILOSEC  Take 20 mg by mouth 2 (two) times daily.     oxyCODONE 15 MG immediate release tablet  Commonly known as:  ROXICODONE  Take 30 mg by mouth every 6 (six) hours as needed. For pain.     senna 8.6 MG Tabs tablet  Commonly known as:  SENOKOT  Take 2 tablets by mouth at bedtime.     solifenacin 10 MG tablet  Commonly known as:  VESICARE  Take 10 mg by mouth daily.     Vitamin D (Ergocalciferol) 50000 UNITS Caps capsule  Commonly known as:  DRISDOL  Take 50,000 Units by mouth every 7 (seven) days. Patient tries to take on Mondays or Tuesdays.         Signed: Karn Cassis 05/21/2013, 1:39 PM

## 2013-05-21 NOTE — Evaluation (Signed)
Occupational Therapy Assessment and Plan  Patient Details  Name: [name] MRN: [id number] Date of Birth: 29-Jan-1936  OT Diagnosis: abnormal posture, muscle weakness (generalized), pain in joint and pain in thoracic spine Rehab Potential: Rehab Potential: Good ELOS: 8-10 days   Today's Date: 05/21/2013 Time:  - 1600-1700  (60 min)    Problem List:  Patient Active Problem List   Diagnosis Date Noted  . Altered mental status 12/02/2011  . Fever 12/02/2011  . Cough 12/02/2011  . HTN (hypertension) 12/02/2011  . Hypothyroidism 12/02/2011    Past Medical History:  Past Medical History  Diagnosis Date  . Prostate cancer     hx of prostate ca  . Hypertension   . Thyroid disease   . Hypothyroidism   . Cough     due to scarring of lung  . GERD (gastroesophageal reflux disease)   . Arthritis   . Complication of anesthesia     sometimes have trouble intubating not last 2 times; limited neck mobility--glidescope used in '08 and '10   Past Surgical History:  Past Surgical History  Procedure Laterality Date  . Lung surgery Right 09    growth on outside of lung  . Tonsillectomy    . Appendectomy    . Shoulder surgery Left     rotator cuff x2  . Back surgery    . Cervical spine surgery    . Knee arthroscopy Right   . Eye surgery Bilateral 10    Assessment & Plan Clinical Impression: HPI: [name] is a 77 y.o. male with history of HTN, prostate cancer, diffuse idiopathic skeletal hyperostosis s/p L3/4 fusion who has had LBP radiating to BLE and limiting ambulation. Work up with L4/5 and L5/S1 stenosis with HNP and patient elected to undergo removal of previous L3-L4 screws with bilateral lam L4/5 and L5-S1 diskectomy with screws L4-S1. Post op with fevers as well as diarrhea with incontinence. Therapies initiated and CIR recommended by team. Patient transferred to CIR on 05/21/2013 .    Patient currently requires mod with basic self-care skills secondary to muscle  weakness and muscle joint tightness.  Prior to hospitalization, patient could complete BADL with supervision.  Patient will benefit from skilled intervention to increase independence with basic self-care skills prior to discharge home with care partner.  Anticipate patient will require minimal physical assistance and follow up home health.  OT - End of Session Activity Tolerance: Tolerates < 10 min activity, no significant change in vital signs Endurance Deficit: Yes OT Assessment Rehab Potential: Good OT Basic ADL's Functional Problem(s): Grooming;Bathing;Dressing;Toileting;Other (comment) OT Advanced ADL's Functional Problem(s):  (pt. would like to make bed/ fold clothes) OT Transfers Functional Problem(s): Toilet;Tub/Shower OT Additional Impairment(s):  (blurred vision) OT Plan OT Intensity: Minimum of 1-2 x/day, 45 to 90 minutes OT Frequency: 5 out of 7 days OT Duration/Estimated Length of Stay: 8-10 days OT Treatment/Interventions: Balance/vestibular training;Discharge planning;DME/adaptive equipment instruction;Pain management;Patient/family education;Self Care/advanced ADL retraining;Therapeutic Activities;UE/LE Strength taining/ROM;Therapeutic Exercise OT Basic Self-Care Anticipated Outcome(s): supervision OT Toileting Anticipated Outcome(s): supervision OT Bathroom Transfers Anticipated Outcome(s): supervision OT Recommendation Patient destination: Home Follow Up Recommendations: Home health OT Equipment Recommended: Tub/shower bench   Skilled Therapeutic Intervention Addressed transfers, sit to stand, postural control, standing balance.  Pt exhibits increased trunk flexion with standing.  Discussed back precautions, donning brace, and AE.    OT Evaluation Precautions/Restrictions  Precautions Precautions: Back;Fall Precaution Comments: reviewed back precautions Required Braces or Orthoses: Spinal Brace Spinal Brace: Lumbar corset;Applied in sitting  position Restrictions Weight Bearing Restrictions: No   Pain:  5/10  back   Home Living/Prior Functioning Home Living Living Arrangements: Spouse/significant other Available Help at Discharge: Family Type of Home: House Home Access: Stairs to enter Secretary/administrator of Steps: 1 Entrance Stairs-Rails: None Home Layout: One level  Lives With: Spouse IADL History Homemaking Responsibilities: No Current License: No Mode of TransportationGames developer Prior Function Driving: No ADL   Vision/Perception  Vision - History Baseline Vision: Wears glasses all the time Visual History: Cataracts Patient Visual Report: Blurring of vision (had eye changes with meds this week) Vision - Assessment Eye Alignment: Within Functional Limits Vision Assessment: Vision not tested Perception Perception: Within Functional Limits Praxis Praxis: Intact  Cognition Overall Cognitive Status: History of cognitive impairments - at baseline Arousal/Alertness: Awake/alert Orientation Level: Oriented X4 Attention: Sustained Sustained Attention: Appears intact Memory: Appears intact Awareness: Appears intact Problem Solving: Impaired Problem Solving Impairment: Verbal basic;Functional basic Safety/Judgment: Appears intact Sensation Sensation Light Touch: Appears Intact Proprioception: Appears Intact Coordination Gross Motor Movements are Fluid and Coordinated: Yes Fine Motor Movements are Fluid and Coordinated: Yes Motor  Motor Motor: Within Functional Limits Mobility  Bed Mobility Bed Mobility: Supine to Sit;Sitting - Scoot to [place] of Bed Rolling Right: With rail;3: Mod assist Rolling Right Details: Manual facilitation for weight shifting Supine to Sit: 2: Max assist Transfers Sit to Stand: 2: Max assist Sit to Stand Details: Manual facilitation for weight shifting Stand to Sit: 3: Mod assist  Trunk/Postural Assessment  Cervical Assessment Cervical Assessment: Exceptions to  [facility] Cervical AROM Overall Cervical AROM Comments:  (decreased AROM in all movements) Thoracic Assessment Thoracic Assessment: Exceptions to [facility] Thoracic AROM Overall Thoracic AROM Comments:  (increased trunk flexion with standing) Lumbar Assessment Lumbar Assessment: Exceptions to [facility] Lumbar AROM Overall Lumbar AROM Comments:  (decreased a/p tilts) Postural Control Postural Control: Deficits on evaluation Trunk Control:  (increased stiffness and flexion)  Balance Static Sitting Balance Static Sitting - Balance Support: Left upper extremity supported;Feet supported Static Standing Balance Static Standing - Balance Support: Bilateral upper extremity supported;During functional activity Static Standing - Level of Assistance: 4: Min assist Extremity/Trunk Assessment RUE Assessment RUE Assessment: Exceptions to [facility] RUE AROM (degrees) RUE Overall AROM Comments:  (shouluder flexion= 0-90 degrees) LUE Assessment LUE Assessment: Exceptions to WFL LUE AROM (degrees) LUE Overall AROM Comments: 2 rotator cuff tears; shouler flexion 0-80 AROM  FIM:  FIM - Eating Eating Activity: 7: Complete independence:no helper FIM - Grooming Grooming Steps: Wash, rinse, dry face;Wash, rinse, dry hands Grooming: 3: Patient completes 2 of 4 or 3 of 5 steps FIM - Bathing Bathing Steps Patient Completed: Chest;Right Arm;Left Arm;Abdomen Bathing: 3: Mod-Patient completes 5-7 11f 10 parts or 50-74% FIM - Upper Body Dressing/Undressing Upper body dressing/undressing: 0: Wears gown/pajamas-no public clothing FIM - Lower Body Dressing/Undressing Lower body dressing/undressing: 0: Wears gown/pajamas-no public clothing FIM - Toileting Toileting: 0: Activity did not occur FIM - Banker Devices: Bed rails Bed/Chair Transfer: 2: Supine > Sit: Max A (lifting assist/Pt. 25-49%);3: Bed > Chair or W/C: Mod A (lift or lower assist)   Refer to Care Plan for Long Term  Goals  Recommendations for other services: None  Discharge Criteria: Patient will be discharged from OT if patient refuses treatment 3 consecutive times without medical reason, if treatment goals not met, if there is a change in medical status, if patient makes no progress towards goals or if patient is discharged from hospital.  The above assessment, treatment  plan, treatment alternatives and goals were discussed and mutually agreed upon: by patient  Humberto Seals 05/21/2013, 6:21 PM

## 2013-05-21 NOTE — Progress Notes (Signed)
Physical Therapy Treatment Patient Details Name: [name] MRN: [id number] DOB: June 10, 1936 Today's Date: 05/21/2013 Time: 4098-1191 PT Time Calculation (min): 20 min  PT Assessment / Plan / Recommendation  History of Present Illness Pt s/p lumbar fusion L4-5, L5-S1.  Pt was on bedrest for POD 1.   PT Comments   Patient making much better progress this session. Patient stated that he got more rest last night and doesn't have pain today. Able to ambulate with minimal assist. OOB with OT earlier and sitting up in recliner upon arrival.   Follow Up Recommendations  CIR     Does the patient have the potential to tolerate intense rehabilitation     Barriers to Discharge        Equipment Recommendations  None recommended by PT    Recommendations for Other Services    Frequency Min 5X/week   Progress towards PT Goals Progress towards PT goals: Progressing toward goals  Plan Current plan remains appropriate    Precautions / Restrictions Precautions Precautions: Back;Fall Required Braces or Orthoses: Spinal Brace Spinal Brace: Lumbar corset;Applied in sitting position Restrictions Weight Bearing Restrictions: No   Pertinent Vitals/Pain Denied pain    Mobility  Bed Mobility Bed Mobility: Not assessed Details for Bed Mobility Assistance: Patient OOB with OT Transfers Sit to Stand: 4: Min assist;From bed;With upper extremity assist Stand to Sit: With upper extremity assist;To chair/3-in-1;4: Min assist Details for Transfer Assistance: Verbal cues for hand placement.  Assist to bring hips up and to stand more erect. Ambulation/Gait Ambulation/Gait Assistance: 4: Min assist Ambulation Distance (Feet): 120 Feet Assistive device: Rolling walker Ambulation/Gait Assistance Details: Cues to stay close to RW and stand upright. Tends to flex with increased ambulation Gait Pattern: Step-through pattern;Decreased step length - right;Decreased step length - left Gait velocity: decr    Exercises     PT Diagnosis:    PT Problem List:   PT Treatment Interventions:     PT Goals (current goals can now be found in the care plan section)    Visit Information  Last PT Received On: 05/21/13 Assistance Needed: +2 History of Present Illness: Pt s/p lumbar fusion L4-5, L5-S1.  Pt was on bedrest for POD 1.    Subjective Data      Cognition  Cognition Arousal/Alertness: Awake/alert Behavior During Therapy: WFL for tasks assessed/performed Overall Cognitive Status: Within Functional Limits for tasks assessed    Balance     End of Session PT - End of Session Equipment Utilized During Treatment: Gait belt;Back brace Activity Tolerance: Patient tolerated treatment well Patient left: in bed;with call bell/phone within reach;with family/visitor present Nurse Communication: Mobility status;Other (comment)   GP     Fredrich Birks 05/21/2013, 11:59 AM  05/21/2013 Fredrich Birks PTA [phone] pager [phone] office

## 2013-05-21 NOTE — H&P (View-Only) (Signed)
Physical Medicine and Rehabilitation Admission H&P    CC: Back pain radiating to BLE  HPI: [name] is a 76 y.o. male with history of HTN, prostate cancer, diffuse idiopathic skeletal hyperostosis s/p L3/4 fusion who has had LBP radiating to BLE and limiting ambulation. Work up with L4/5 and L5/S1 stenosis with HNP and patient elected to undergo removal of previous L3-L4 screws with bilateral lam L4/5 and L5-S1 diskectomy with screws L4-S1. Post op with fevers as well as diarrhea with incontinence. Therapies initiated and CIR recommended by team.    Review of Systems  HENT: Negative for hearing loss.   Eyes: Positive for double vision (for the past month. ).  Respiratory: Positive for cough (chronic--takes multiple medications. ).   Cardiovascular: Negative for chest pain and palpitations.  Gastrointestinal: Positive for heartburn and diarrhea.  Genitourinary: Positive for urgency and frequency.       Chronic incontinence   Musculoskeletal: Positive for myalgias.  Neurological: Positive for speech change (dysphonia since neck surgery). Negative for headaches.   Past Medical History  Diagnosis Date  . Prostate cancer     hx of prostate ca  . Hypertension   . Thyroid disease   . Hypothyroidism   . Cough     due to scarring of lung  . GERD (gastroesophageal reflux disease)   . Arthritis   . Complication of anesthesia     sometimes have trouble intubating not last 2 times; limited neck mobility--glidescope used in '08 and '10   Past Surgical History  Procedure Laterality Date  . Lung surgery Right 09    growth on outside of lung  . Tonsillectomy    . Appendectomy    . Shoulder surgery Left     rotator cuff x2  . Back surgery    . Cervical spine surgery    . Knee arthroscopy Right   . Eye surgery Bilateral 10   History reviewed. No pertinent family history.  Social History: Married. 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 He reports that  he quit smoking about 31 years ago. His smoking use included Cigarettes. He has a 45 pack-year smoking history. He does not have any smokeless tobacco history on file. He reports that he does not drink alcohol or use illicit drugs.   Allergies: No Known Allergies  Medications Prior to Admission  Medication Sig Dispense Refill  . amLODipine (NORVASC) 5 MG tablet Take 10 mg by mouth daily. Hold if SBP <100      . benzonatate (TESSALON) 100 MG capsule Take 100 mg by mouth 2 (two) times daily.      . diazepam (VALIUM) 5 MG tablet Take 5 mg by mouth 2 (two) times daily as needed for anxiety.      . DULoxetine (CYMBALTA) 60 MG capsule Take 60 mg by mouth daily.      . dutasteride (AVODART) 0.5 MG capsule Take 0.5 mg by mouth daily.      . fluticasone (FLONASE) 50 MCG/ACT nasal spray Place 2 sprays into the nose daily.      . guaiFENesin (MUCINEX) 600 MG 12 hr tablet Take 600 mg by mouth 2 (two) times daily.      . ibuprofen (ADVIL,MOTRIN) 800 MG tablet Take 800 mg by mouth every 8 (eight) hours as needed for pain.      . levothyroxine (SYNTHROID, LEVOTHROID) 50 MCG tablet Take 50 mcg by mouth See admin instructions. Take on Mondays, Tuesdays, Wednesdays, Thursdays, and   Fridays.      . levothyroxine (SYNTHROID, LEVOTHROID) 75 MCG tablet Take 75 mcg by mouth See admin instructions. Take on Saturdays and Sundays only.      . LORazepam (ATIVAN) 1 MG tablet Take 1 mg by mouth 2 (two) times daily.      . metoprolol tartrate (LOPRESSOR) 25 MG tablet Take 25 mg by mouth 2 (two) times daily.      . omeprazole (PRILOSEC) 20 MG capsule Take 20 mg by mouth 2 (two) times daily.      . oxyCODONE (ROXICODONE) 15 MG immediate release tablet Take 30 mg by mouth every 6 (six) hours as needed. For pain.      . senna (SENOKOT) 8.6 MG TABS Take 2 tablets by mouth at bedtime.      . solifenacin (VESICARE) 10 MG tablet Take 10 mg by mouth daily.      . Vitamin D, Ergocalciferol, (DRISDOL) 50000 UNITS CAPS Take 50,000  Units by mouth every 7 (seven) days. Patient tries to take on Mondays or Tuesdays.      . imipramine (TOFRANIL) 25 MG tablet Take 25 mg by mouth 2 (two) times daily.        Home: Home Living Family/patient expects to be discharged to:: Private residence Living Arrangements: Spouse/significant other Available Help at Discharge: Family Type of Home: House Home Access: Stairs to enter Entrance Stairs-Number of Steps: 1 Home Layout: One level Home Equipment: Walker - 2 wheels;Cane - single point;Shower seat;Hand held shower head   Functional History: Prior Function Comments: using straight cane  Functional Status:  Mobility: Bed Mobility Bed Mobility: Not assessed Rolling Right: 2: Max assist;With rail Rolling Left: 2: Max assist;With rail Rolling Left: Patient Percentage: 50% Left Sidelying to Sit: 1: +2 Total assist;HOB flat;With rails Left Sidelying to Sit: Patient Percentage: 50% Sitting - Scoot to Edge of Bed: 1: +2 Total assist Sitting - Scoot to Edge of Bed: Patient Percentage: 60% Sit to Sidelying Left: 1: +2 Total assist Sit to Sidelying Left: Patient Percentage: 60% Transfers Transfers: Sit to Stand;Stand to Sit Sit to Stand: 4: Min assist;From bed;With upper extremity assist Sit to Stand: Patient Percentage: 60% Stand to Sit: With upper extremity assist;To chair/3-in-1;4: Min assist Ambulation/Gait Ambulation/Gait Assistance: 4: Min assist Ambulation/Gait: Patient Percentage: 70% Ambulation Distance (Feet): 120 Feet Assistive device: Rolling walker Ambulation/Gait Assistance Details: Cues to stay close to RW and stand upright. Tends to flex with increased ambulation Gait Pattern: Step-through pattern;Decreased step length - right;Decreased step length - left Gait velocity: decr    ADL: ADL Grooming: Minimal assistance Where Assessed - Grooming: Supine, head of bed up Lower Body Bathing: +1 Total assistance Where Assessed - Lower Body Bathing: Rolling right  and/or left Lower Body Dressing: +1 Total assistance Where Assessed - Lower Body Dressing: Supine, head of bed up ADL Comments: Pt supine in bed voiding bowel. Pt log rolled right and left for hygiene. pt with constant voiding bowel. Pt repositioned in bed. Pt required two person assist to achieve enough side lying for peri care.  Cognition: Cognition Overall Cognitive Status: Within Functional Limits for tasks assessed Orientation Level: Oriented to person;Oriented to place;Disoriented to time;Oriented to situation Cognition Arousal/Alertness: Awake/alert Behavior During Therapy: WFL for tasks assessed/performed Overall Cognitive Status: Within Functional Limits for tasks assessed Area of Impairment: Safety/judgement;Problem solving Orientation Level: Time Memory: Decreased short-term memory;Decreased recall of precautions Safety/Judgement: Decreased awareness of safety Problem Solving: Slow processing;Requires verbal cues;Requires tactile cues General Comments: Became confused on medication    Physical Exam: Blood pressure 122/73, pulse 94, temperature 98.6 F (37 C), temperature source Oral, resp. rate 18, SpO2 93.00%. Physical Exam  Nursing note and vitals reviewed. Constitutional: He is oriented to person, place, and time. He appears well-developed and well-nourished. No distress.  Flat affect.   HENT:  Head: Normocephalic and atraumatic.     Eyes: Conjunctivae are normal. Pupils are equal, round, and reactive to light. No scleral icterus.  Neck: Normal range of motion. Neck supple. No JVD present. No tracheal deviation present. No thyromegaly present.  Cardiovascular: Normal rate and regular rhythm.  Exam reveals no friction rub.   No murmur heard. Respiratory: Effort normal and breath sounds normal. No respiratory distress. He has no rales.  Upper airway rhonchi due to oral secretions.   GI: Soft. Bowel sounds are normal.  Musculoskeletal:  Back tender. Wearing LSO   Lymphadenopathy:    He has no cervical adenopathy.  Neurological: He is alert and oriented to person, place, and time. He displays normal reflexes. No cranial nerve deficit. Coordination abnormal.  Wet, dysphonic voice. Slow, monotonic speech pattern. Follows basic commands without difficulty. Appears to have delayed processing. Masked facies appearance  Decreased proprioception left great toe, decreased light touch sensation left great toe. Intact sensation bilateral S1 Motor strength 5/5 in bilateral deltoid, bicep, tricep, grip 3 minus to 3/5 bilateral hip flexors knee extensors ankle dorsiflexors and plantar flexors with delayed processing and movement. No gross sensory abnormalities seen.      Skin: Skin is warm. He is not diaphoretic.  Back incision intact with staples and large amount of serosanguineous drainage from mid to lower back area--2 soaked ABD pads in place.     No results found for this or any previous visit (from the past 48 hour(s)). Dg Chest Port 1 View  05/19/2013   CLINICAL DATA:  Fever  EXAM: PORTABLE CHEST - 1 VIEW  COMPARISON:  05/10/2013  FINDINGS: Mild cardiomegaly. Bibasilar atelectasis. No pneumothorax or pleural effusion.  IMPRESSION: Bibasilar atelectasis. Cardiomegaly without pulmonary edema.   Electronically Signed   By: Art  Hoss M.D.   On: 05/19/2013 18:40    Post Admission Physician Evaluation: 1. Functional deficits secondary  to lumbar stenosis with spondylolisthesis and myelopathy. History of DISH and prior fusion . 2. Patient is admitted to receive collaborative, interdisciplinary care between the physiatrist, rehab nursing staff, and therapy team. 3. Patient's level of medical complexity and substantial therapy needs in context of that medical necessity cannot be provided at a lesser intensity of care such as a SNF. 4. Patient has experienced substantial functional loss from his/her baseline which was documented above under the "Functional History"  and "Functional Status" headings.  Judging by the patient's diagnosis, physical exam, and functional history, the patient has potential for functional progress which will result in measurable gains while on inpatient rehab.  These gains will be of substantial and practical use upon discharge  in facilitating mobility and self-care at the household level. 5. Physiatrist will provide 24 hour management of medical needs as well as oversight of the therapy plan/treatment and provide guidance as appropriate regarding the interaction of the two. 6. 24 hour rehab nursing will assist with bladder management, bowel management, safety, skin/wound care, disease management, medication administration, pain management and patient education  and help integrate therapy concepts, techniques,education, etc. 7. PT will assess and treat for/with: Lower extremity strength, range of motion, stamina, balance, functional mobility, safety, adaptive techniques and equipment, NMR, pain mgt, back precautions.     Goals are: supervision to min assist. 8. OT will assess and treat for/with: ADL's, functional mobility, safety, upper extremity strength, adaptive techniques and equipment, NMR, pain mgt, back precautions.   Goals are: min assist. 9. SLP will assess and treat for/with: TBD.  Goals are: TBD---may need an eval. 10. Case Management and Social Worker will assess and treat for psychological issues and discharge planning. 11. Team conference will be held weekly to assess progress toward goals and to determine barriers to discharge. 12. Patient will receive at least 3 hours of therapy per day at least 5 days per week. 13. ELOS: 2-3 weeks       14. Prognosis:  good   Medical Problem List and Plan: 1. DVT Prophylaxis/Anticoagulation: Pharmaceutical: Lovenox 2. Pain Management:  Prn oxycodone effective.  3. Mood:  Resume ativan--uses bid basis. Will discontinue valium. continue cymbalta. LSCW to follow for evaluation.  4.  Neuropsych: This patient is capable of making decisions on his own behalf. He is slow in processing and almost has a parkinsonian appearance. Will monitor.  5. Diarrhea:  Resolving. Will discontinue senna and add probiotic.  6. HTN:  Will monitor with bid checks.  continue Norvasc and metoprolol.  7. BPH/Prostate cancer: Continue Avodart, Enablex--discontinue Flomax, and Tofranil as he does not use these anymore.  8. Hypothyroid: continue supplement.  9. Chronic cough: continue tessalon, mucinex--resume flonsae as well as PPI for chronic GERD which is likely a contributing factor.     Zachary T. Swartz, MD, FAAPMR [facility]   05/21/2013 

## 2013-05-21 NOTE — Interval H&P Note (Signed)
[name] was admitted today to Inpatient Rehabilitation with the diagnosis of lumbar stenosis,myelopathy with DISH s/p lumbar fusion.  The patient's history has been reviewed, patient examined, and there is no change in status.  Patient continues to be appropriate for intensive inpatient rehabilitation.  I have reviewed the patient's chart and labs.  Questions were answered to the patient's satisfaction.  Dorathea Faerber T 05/21/2013, 7:38 PM

## 2013-05-21 NOTE — Progress Notes (Signed)
Occupational Therapy Treatment Patient Details Name: SHOICHI MIELKE MRN: [id number] DOB: 1936/03/22 Today's Date: 05/21/2013 Time: 4098-1191 OT Time Calculation (min): 43 min  OT Assessment / Plan / Recommendation  History of present illness Pt s/p lumbar fusion L4-5, L5-S1.  Pt was on bedrest for POD 1.   OT comments  Pt progressing this session with OT to chair position s/p bath. Pt needs (A) for LB bathing. Pt motivated and participating well with therapy.    Follow Up Recommendations  CIR    Barriers to Discharge       Equipment Recommendations  3 in 1 bedside comode;Other (comment) (RW)    Recommendations for Other Services Rehab consult  Frequency Min 2X/week   Progress towards OT Goals Progress towards OT goals: Progressing toward goals  Plan Discharge plan remains appropriate    Precautions / Restrictions Precautions Precautions: Back;Fall Precaution Comments: reviewed back precautions Required Braces or Orthoses: Spinal Brace Spinal Brace: Lumbar corset;Applied in sitting position   Pertinent Vitals/Pain Minimal pain  Drainage from bandage    ADL  Eating/Feeding: Set up Where Assessed - Eating/Feeding: Bed level Grooming: Wash/dry face;Teeth care;Minimal assistance Where Assessed - Grooming: Supported sitting Upper Body Bathing: Chest;Right arm;Left arm;Abdomen;Minimal assistance Where Assessed - Upper Body Bathing: Supine, head of bed up Lower Body Bathing: +1 Total assistance Where Assessed - Lower Body Bathing: Supported sit to stand Upper Body Dressing: Minimal assistance (needed mod (A) to don brace) Where Assessed - Upper Body Dressing: Unsupported sitting Lower Body Dressing: +1 Total assistance Where Assessed - Lower Body Dressing: Supported sit to Art therapist Transfer: +2 Total assistance Toilet Transfer: Patient Percentage: 60% Acupuncturist: Raised toilet seat with arms (or 3-in-1 over toilet) Equipment Used: Gait belt;Rolling  walker;Back brace Transfers/Ambulation Related to ADLs: Pt sit<>Stand total +2 with drainage noted from inferior most aspect of dressing. RN present and reinforcing bandage.  ADL Comments: Pt supine and requesting bath on arrival. Pt able to complete UB and face. pt needed (A) for peri and LB bathing. Pt voiding bladder and unaware condom cath not on all night. Pt aware that it should be on and the purpose but unaware that be is wet and soiled. Pt with high risk for skin break down due to aware of void. pt using smell during mobility to state "i think i need to be changed" Pt with adult diaper applied due to frequent void of bowel in previous session. PT position in chair     OT Diagnosis:    OT Problem List:   OT Treatment Interventions:     OT Goals(current goals can now be found in the care plan section) Acute Rehab OT Goals Patient Stated Goal: Go home OT Goal Formulation: With patient/family Time For Goal Achievement: 06/03/13 Potential to Achieve Goals: Good ADL Goals Pt Will Perform Grooming: with supervision;sitting Pt Will Perform Upper Body Bathing: with supervision;sitting Pt Will Transfer to Toilet: with mod assist;bedside commode Additional ADL Goal #1: Pt will don doff back brace MOD I  Visit Information  Last OT Received On: 05/21/13 Assistance Needed: +2 History of Present Illness: Pt s/p lumbar fusion L4-5, L5-S1.  Pt was on bedrest for POD 1.    Subjective Data      Prior Functioning  Home Living  Lives With: Spouse    Cognition  Cognition Arousal/Alertness: Awake/alert Behavior During Therapy: WFL for tasks assessed/performed Overall Cognitive Status: History of cognitive impairments - at baseline Area of Impairment: Safety/judgement;Problem solving Memory: Decreased short-term memory;Decreased recall  of precautions Safety/Judgement: Decreased awareness of safety    Mobility  Bed Mobility Bed Mobility: Supine to Sit;Sitting - Scoot to [place] of Bed Rolling  Left: 2: Max assist;With rail Supine to Sit: 1: +2 Total assist;HOB elevated Supine to Sit: Patient Percentage: 40% Sitting - Scoot to Edge of Bed: 4: Min assist Details for Bed Mobility Assistance: pt using bil ue to push to EOB and scooting one hip at a time. Transfers Transfers: Sit to Stand;Stand to Sit Sit to Stand: 1: +2 Total assist;With upper extremity assist;From bed Sit to Stand: Patient Percentage: 60% Stand to Sit: 1: +2 Total assist;With upper extremity assist;To chair/3-in-1 Stand to Sit: Patient Percentage: 70% Details for Transfer Assistance: v/c for hand placement and safety with RW.     Exercises      Balance     End of Session OT - End of Session Activity Tolerance: Patient tolerated treatment well Patient left: in chair;with call bell/phone within reach;with nursing/sitter in room Nurse Communication: Mobility status;Precautions  GO     Harolyn Rutherford 05/21/2013, 1:36 PM Pager: [phone]

## 2013-05-21 NOTE — Progress Notes (Signed)
Referred to this CSW today for ?SNF. Attempted to speak with patient today but RNCM for CIR was visiting with patient who states plan is for CIR. FL[place] and [place] have already been completed in case SNF was needed- CSW to sign off- please contact us if SW needs arise. Reece Levy, MSW [phone]

## 2013-05-21 NOTE — PMR Pre-admission (Signed)
PMR Admission Coordinator Pre-Admission Assessment  Patient: [name] is an 77 y.o., male MRN: [id number] DOB: Feb 08, 1936 Height:   Weight:                Insurance Information HMO:     PPO:      PCP:      IPA:      80/20: yes     OTHER:  No HMO PRIMARY: medicare a and b      Policy#: [id number]      Subscriber: pt Benefits:  Phone #: [email or web address]     Name: 05/20/13 Eff. Date: 07/13/03     Deduct: $1216      Out of Pocket Max: none      Life Max: none CIR: 100%      SNF: 20 full days LBD 12/04/11 Outpatient: 80%     Co-Pay: 20% Home Health: 100%      Co-Pay: none DME: 80%     Co-Pay: 20% Providers: pt choice  SECONDARY: [employer] [place]      Policy#: [id number]      Subscriber: pt  Emergency Contact Information Contact Information   Name Relation Home Work Mobile   [place] Spouse [phone]  [phone]     Current Medical History  Patient Admitting Diagnosis: Lumbar spinal stenosis with neurogenic claudication status post decompression and fusion postoperative day #3 3 History of Present Illness: [name] is a 77 y.o. male with history of HTN, prostate cancer, diffuse idiopathic skeletal hyperostosis s/p L3/4 fusion who has had LBP radiating to BLE and limiting ambulation. Work up with L4/5 and L5/S1 stenosis with HNP and patient elected to undergo removal of previous L3-L4 screws with bilateral lam L4/5 and L5-S1 diskectomy with screws L4-S1. Post op with fevers temp max 100.4.    Past Medical History  Past Medical History  Diagnosis Date  . Prostate cancer     hx of prostate ca  . Hypertension   . Thyroid disease   . Hypothyroidism   . Cough     due to scarring of lung  . GERD (gastroesophageal reflux disease)   . Arthritis   . Complication of anesthesia     sometimes have trouble intubating not last 2 times; limited neck mobility--glidescope used in '08 and '10    Family History  family history is not on file.  Prior Rehab/Hospitalizations:  home health only   Current Medications  Current facility-administered medications:0.9 %  sodium chloride infusion, 250 mL, Intravenous, Continuous, Karn Cassis, MD;  0.9 %  sodium chloride infusion, , Intravenous, Continuous, Karn Cassis, MD, Last Rate: 100 mL/hr at 05/19/13 1000;  acetaminophen (TYLENOL) suppository 650 mg, 650 mg, Rectal, Q4H PRN, Karn Cassis, MD;  acetaminophen (TYLENOL) tablet 650 mg, 650 mg, Oral, Q4H PRN, Karn Cassis, MD, 650 mg at 05/19/13 1747 amLODipine (NORVASC) tablet 10 mg, 10 mg, Oral, Daily, Karn Cassis, MD, 10 mg at 05/21/13 1013;  benzonatate (TESSALON) capsule 100 mg, 100 mg, Oral, BID, Karn Cassis, MD, 100 mg at 05/21/13 1008;  darifenacin (ENABLEX) 24 hr tablet 7.5 mg, 7.5 mg, Oral, Daily, Karn Cassis, MD, 7.5 mg at 05/21/13 1012;  diazepam (VALIUM) tablet 5 mg, 5 mg, Oral, Q6H PRN, Karn Cassis, MD, 5 mg at 05/20/13 1748 DULoxetine (CYMBALTA) DR capsule 60 mg, 60 mg, Oral, Daily, Karn Cassis, MD, 60 mg at 05/21/13 1012;  dutasteride (AVODART) capsule 0.5 mg, 0.5 mg, Oral, Daily, Karn Cassis,  MD, 0.5 mg at 05/21/13 1012;  guaiFENesin (MUCINEX) 12 hr tablet 600 mg, 600 mg, Oral, BID, Karn Cassis, MD, 600 mg at 05/21/13 1011;  heparin injection 5,000 Units, 5,000 Units, Subcutaneous, Q8H, Karn Cassis, MD, 5,000 Units at 05/21/13 0608 imipramine (TOFRANIL) tablet 25 mg, 25 mg, Oral, BID, Karn Cassis, MD, 25 mg at 05/21/13 1013;  levothyroxine (SYNTHROID, LEVOTHROID) tablet 50 mcg, 50 mcg, Oral, Custom, Karn Cassis, MD, 50 mcg at 05/21/13 5284;  [START ON 05/22/2013] levothyroxine (SYNTHROID, LEVOTHROID) tablet 75 mcg, 75 mcg, Oral, Custom, Karn Cassis, MD;  menthol-cetylpyridinium (CEPACOL) lozenge 3 mg, 1 lozenge, Oral, PRN, Karn Cassis, MD metoprolol tartrate (LOPRESSOR) tablet 25 mg, 25 mg, Oral, BID, Karn Cassis, MD, 25 mg at 05/21/13 1013;  ondansetron (ZOFRAN) injection 4 mg, 4 mg,  Intravenous, Q4H PRN, Karn Cassis, MD;  oxyCODONE (Oxy IR/ROXICODONE) immediate release tablet 15 mg, 15 mg, Oral, Q4H PRN, Karn Cassis, MD, 10 mg at 05/21/13 0612;  phenol (CHLORASEPTIC) mouth spray 1 spray, 1 spray, Mouth/Throat, PRN, Karn Cassis, MD senna [facility]) tablet 17.2 mg, 2 tablet, Oral, QHS, Karn Cassis, MD, 17.2 mg at 05/19/13 2156;  sodium chloride 0.9 % injection 3 mL, 3 mL, Intravenous, Q12H, Karn Cassis, MD, 3 mL at 05/21/13 1012;  sodium chloride 0.9 % injection 3 mL, 3 mL, Intravenous, PRN, Karn Cassis, MD;  tamsulosin [facility]) capsule 0.8 mg, 0.8 mg, Oral, Daily, Karn Cassis, MD, 0.8 mg at 05/21/13 1011 zolpidem (AMBIEN) tablet 5 mg, 5 mg, Oral, QHS PRN, Karn Cassis, MD  Patients Current Diet: General  Precautions / Restrictions Precautions Precautions: Back;Fall Spinal Brace: Lumbar corset;Applied in sitting position Restrictions Weight Bearing Restrictions: No   Prior Activity Level Limited Community (1-2x/wk): pt very limited over several months and sedentary  Journalist, newspaper / Equipment Home Assistive Devices/Equipment: Eyeglasses Home Equipment: Environmental consultant - 2 wheels;Cane - single point;Shower seat;Hand held shower head  Prior Functional Level Prior Function Level of Independence: Independent with assistive device(s) Comments: using straight cane  Current Functional Level Cognition  Overall Cognitive Status: History of cognitive impairments - at baseline (pt an dwife report short term memory issues pta) Orientation Level: Oriented to person;Oriented to place;Disoriented to time;Oriented to situation Safety/Judgement: Decreased awareness of safety General Comments: Became confused on medication    Extremity Assessment (includes Sensation/Coordination)          ADLs  Grooming: Minimal assistance Where Assessed - Grooming: Supine, head of bed up Lower Body Bathing: +1 Total assistance Where Assessed - Lower  Body Bathing: Rolling right and/or left Lower Body Dressing: +1 Total assistance Where Assessed - Lower Body Dressing: Supine, head of bed up ADL Comments: Pt supine in bed voiding bowel. Pt log rolled right and left for hygiene. pt with constant voiding bowel. Pt repositioned in bed. Pt required two person assist to achieve enough side lying for peri care.    Mobility  Bed Mobility: Not assessed Rolling Right: 2: Max assist;With rail Rolling Left: 2: Max assist;With rail Rolling Left: Patient Percentage: 50% Left Sidelying to Sit: 1: +2 Total assist;HOB flat;With rails Left Sidelying to Sit: Patient Percentage: 50% Sitting - Scoot to Edge of Bed: 1: +2 Total assist Sitting - Scoot to Edge of Bed: Patient Percentage: 60% Sit to Sidelying Left: 1: +2 Total assist Sit to Sidelying Left: Patient Percentage: 60%    Transfers  Transfers: Sit to Stand;Stand to Sit Sit to Stand: 4: Min  assist;From bed;With upper extremity assist Sit to Stand: Patient Percentage: 60% Stand to Sit: With upper extremity assist;To chair/3-in-1;4: Min assist    Ambulation / Gait / Stairs / Psychologist, prison and probation services  Ambulation/Gait Ambulation/Gait Assistance: 4: Min assist Ambulation/Gait: Patient Percentage: 70% Ambulation Distance (Feet): 120 Feet Assistive device: Rolling walker Ambulation/Gait Assistance Details: Cues to stay close to RW and stand upright. Tends to flex with increased ambulation Gait Pattern: Step-through pattern;Decreased step length - right;Decreased step length - left Gait velocity: decr    Posture / Balance Static Sitting Balance Static Sitting - Balance Support: Bilateral upper extremity supported Static Sitting - Level of Assistance: 3: Mod assist;5: Stand by assistance Static Sitting - Comment/# of Minutes: Initially required mod A due to posterior lean but eventually improved and only needed stand by.  Pt sat EOB x 10-15 minutes due to back dressing needing to be changed, incontinence  of urine, use of urinal and other personal needs. Static Standing Balance Static Standing - Balance Support: Bilateral upper extremity supported Static Standing - Level of Assistance: 4: Min assist Static Standing - Comment/# of Minutes: Stood 4-5 different times due to multiple personal needs. Stood for 1-2 minutes each time.    Special needs/care consideration Bowel mgmt: incontinent Bladder mgmt:foley Pt and wife report short term memory issues pta   Previous Home Environment Living Arrangements: Spouse/significant other  Lives With: Spouse Available Help at Discharge: Family Type of Home: House Home Layout: One level Home Access: Stairs to enter Secretary/administrator of Steps: 1 Bathroom Shower/Tub: Counselling psychologist: Yes How Accessible: Accessible via walker Home Care Services: No  Discharge Living Setting Plans for Discharge Living Setting: Patient's home;Lives with (comment) Type of Home at Discharge: House Discharge Home Layout: One level Discharge Home Access: Stairs to enter Entrance Stairs-Number of Steps: 1 step Discharge Bathroom Shower/Tub: Tub/shower unit Discharge Bathroom Toilet: Standard Discharge Bathroom Accessibility: Yes How Accessible: Accessible via walker Does the patient have any problems obtaining your medications?: No  Social/Family/Support Systems Patient Roles: Spouse Contact Information: Peggie Hopping, wife Anticipated Caregiver: wife Anticipated Caregiver's Contact Information: home [phone]; cell [phone] Ability/Limitations of Caregiver: no limitations; works Systems developer at [employer] but will plan not to work Engineer, structural Availability: 24/7 Discharge Plan Discussed with Primary Caregiver: Yes Is Caregiver In Agreement with Plan?: Yes Does Caregiver/Family have Issues with Lodging/Transportation while Pt is in Rehab?: No    Goals/Additional Needs Patient/Family Goal for Rehab: supervision  with PT and OT Expected length of stay: ELOS 1 to 2 weeks Pt/Family Agrees to Admission and willing to participate: Yes Program Orientation Provided & Reviewed with Pt/Caregiver Including Roles  & Responsibilities: Yes   Decrease burden of Care through IP rehab admission: n/a  Possible need for SNF placement upon discharge:n/a  Patient Condition: This patient's condition remains as documented in the consult dated 05/20/2013, in which the Rehabilitation Physician determined and documented that the patient's condition is appropriate for intensive rehabilitative care in an inpatient rehabilitation facility. Will admit to inpatient rehab today.  Preadmission Screen Completed By:  Clois Dupes, 05/21/2013 12:31 PM ______________________________________________________________________   Discussed status with Dr. Riley Kill on 05/21/13 at  1238 and received telephone approval for admission today.  Admission Coordinator:  Clois Dupes, time 8295 Date 05/21/13.

## 2013-05-22 ENCOUNTER — Inpatient Hospital Stay (HOSPITAL_COMMUNITY): Payer: Medicare Other | Admitting: Occupational Therapy

## 2013-05-22 ENCOUNTER — Inpatient Hospital Stay (HOSPITAL_COMMUNITY): Payer: Medicare Other | Admitting: Physical Therapy

## 2013-05-22 DIAGNOSIS — M5126 Other intervertebral disc displacement, lumbar region: Secondary | ICD-10-CM

## 2013-05-22 DIAGNOSIS — E039 Hypothyroidism, unspecified: Secondary | ICD-10-CM

## 2013-05-22 DIAGNOSIS — I10 Essential (primary) hypertension: Secondary | ICD-10-CM

## 2013-05-22 NOTE — Progress Notes (Signed)
Occupational Therapy Session Note  Patient Details  Name: [name] MRN: [id number] Date of Birth: 11/05/1935  Today's Date: 05/22/2013 Time: 2841-3244 Time Calculation (min): 70 min  Short Term Goals: Week 1:  OT Short Term Goal 1 (Week 1): Pt. will perform LB dressing with AE at minimal assist level OT Short Term Goal 2 (Week 1): Pt. will bathe LB with AE at minimal assist level OT Short Term Goal 3 (Week 1): Pt. will transfer to toilet with supervision level OT Short Term Goal 4 (Week 1): Pt. will recall back precautions with 1 questioning cue OT Short Term Goal 5 (Week 1): Pt. will don back brace with supervision  Skilled Therapeutic Interventions/Progress Updates:  Patient eating breakfast in bed upon arrival.  Initially planned for a shower however not enough time in session due to patient is very slow with breakfast, pill taking, all functional mobility, and need to toilet on BSC.  Engaged in toilet transfer, toileting, bathe and dress.  Focused session on activity tolerance, sit><stands, dynamic standing balance and standing tolerance, stand step transfers with RW, verbalize and maintain back precautions during functional mobility and BADL tasks.  Patient performs all tasks very slowly.    Therapy Documentation Precautions:  Precautions Precautions: Back;Fall Precaution Comments: reviewed back precautions Required Braces or Orthoses: Spinal Brace Spinal Brace: Lumbar corset;Applied in sitting position Restrictions Weight Bearing Restrictions: No Pain: 6/10 lower back, RN provided medication, rest and repositioned ADL: See FIM for current functional status  Therapy/Group: Individual Therapy  Yvette Roark 05/22/2013, 9:26 AM

## 2013-05-22 NOTE — Progress Notes (Signed)
[name] is a 77 y.o. male [birth year] [id number]  Subjective: No new complaints. No new problems. Slept well. Feeling OK.  Objective: Vital signs in last 24 hours: Temp:  [97 F (36.1 C)-98.1 F (36.7 C)] 97 F (36.1 C) (10/11 0615) Pulse Rate:  [58-94] 58 (10/11 0615) Resp:  [16-18] 16 (10/11 0615) BP: (101-123)/(63-70) 123/64 mmHg (10/11 0615) SpO2:  [94 %-96 %] 94 % (10/11 0615) Weight:  [212 lb (96.163 kg)] 212 lb (96.163 kg) (10/10 1827) Weight change:  Last BM Date: 05/20/13  Intake/Output from previous day: 10/10 0701 - 10/11 0700 In: 240 [P.O.:240] Out: -  Last cbgs: CBG (last 3)  No results found for this basename: GLUCAP,  in the last 72 hours   Physical Exam General: No apparent distress    HEENT: moist mucosa Lungs: Normal effort. Lungs clear to auscultation, no crackles or wheezes. Cardiovascular: Regular rate and rhythm, no edema Abdomen: S/NT/ND; BS(+) Musculoskeletal:  No change from before Neurological: No new neurological deficits Wounds: N/A    Skin: clear Alert, cooperative   Lab Results: BMET    Component Value Date/Time   NA 139 05/10/2013 1052   K 3.7 05/10/2013 1052   CL 103 05/10/2013 1052   CO2 27 05/10/2013 1052   GLUCOSE 115* 05/10/2013 1052   BUN 16 05/10/2013 1052   CREATININE 0.64 05/10/2013 1052   CALCIUM 9.0 05/10/2013 1052   GFRNONAA >90 05/10/2013 1052   GFRAA >90 05/10/2013 1052   CBC    Component Value Date/Time   WBC 7.9 05/10/2013 1052   RBC 4.42 05/10/2013 1052   HGB 12.9* 05/10/2013 1052   HCT 39.7 05/10/2013 1052   PLT 165 05/10/2013 1052   MCV 89.8 05/10/2013 1052   MCH 29.2 05/10/2013 1052   MCHC 32.5 05/10/2013 1052   RDW 14.8 05/10/2013 1052   LYMPHSABS 1.5 12/04/2011 0510   MONOABS 0.9 12/04/2011 0510   EOSABS 0.1 12/04/2011 0510   BASOSABS 0.0 12/04/2011 0510    Studies/Results: No results found.  Medications: I have reviewed the patient's current medications.  Assessment/Plan:  1. DVT  Prophylaxis/Anticoagulation: Pharmaceutical: Lovenox  2. Pain Management: Prn oxycodone effective.  3. Mood: Resume ativan--uses bid basis. Will discontinue valium. continue cymbalta. LSCW to follow for evaluation.  4. Neuropsych: This patient is capable of making decisions on his own behalf. He is slow in processing and almost has a parkinsonian appearance. Will monitor.  5. Diarrhea: Resolving. Will discontinue senna and add probiotic.  6. HTN: Will monitor with bid checks. continue Norvasc and metoprolol.  7. BPH/Prostate cancer: Continue Avodart, Enablex--discontinue Flomax, and Tofranil as he does not use these anymore.  8. Hypothyroid: continue supplement.  9. Chronic cough: continue tessalon, mucinex--resume flonsae as well as PPI for chronic GERD which is likely a contributing factor.      Length of stay, days: 1  Sonda Primes , MD 05/22/2013, 9:40 AM

## 2013-05-22 NOTE — Progress Notes (Signed)
Physical Therapy Session Note  Patient Details  Name: NEEL BUFFONE MRN: [id number] Date of Birth: [birth year]  Today's Date: 05/22/2013 Time: 4098-1191 Time Calculation (min): 43 min  Short Term Goals: Week 1:  PT Short Term Goal 1 (Week 1): Pt roll R/L with siderails via log rolling with S PT Short Term Goal 2 (Week 1): Pt will transfer supine to edge of bed with S.  PT Short Term Goal 3 (Week 1): Pt will transfer stand pivot with S.  PT Short Term Goal 4 (Week 1): Pt will ambulate with rolling walker 150 feet with S.  PT Short Term Goal 5 (Week 1): Pt will ascend/descend 2 4" steps with rolling walker and S.   Skilled Therapeutic Interventions/Progress Updates:  Pt was seen bedside in the pm. Pt rolled into L sidelying, via log rolling with min A and verbal cues. Pt transferred side lying to edge of bed with min-mod A. Donned lumbar corset at edge of bed. Pt transferred sit to stand with rolling walker and min A. Pt ambulated x 3 for distances of 140, 150 and 140 feet with rolling walker and min guard with occasional verbal cues. Pt left sitting up in w.c with all needs within reach.   Therapy Documentation Precautions:  Precautions Precautions: Back Precaution Comments: reviewed back precautions, pt verbalized 3/3 Required Braces or Orthoses: Spinal Brace Spinal Brace: Lumbar corset;Applied in sitting position Restrictions Weight Bearing Restrictions: No General:   Pain: Pt c/o pain 7/10 low back. Mobility:   Locomotion : Ambulation Ambulation/Gait Assistance: 4: Min guard   See FIM for current functional status  Therapy/Group: Individual Therapy  Rayford Halsted 05/22/2013, 4:00 PM

## 2013-05-22 NOTE — Progress Notes (Signed)
Occupational Therapy Session Note  Patient Details  Name: [name] MRN: [id number] Date of Birth: [birth year]  Today's Date: 05/22/2013 Time: 1440-1510 Time Calculation (min): 30 min  Skilled Therapeutic Interventions/Progress Updates:    Pt worked on using AE for [employer] dressing.  Pt able to state 2/3 back precautions.  Practiced using the reacher and sockaide with supervision from wheelchair level.  Practiced using the reacher to thread his undergarment protector with supervision as well.  Pt is able to cross his LEs without pain however for dressing in sitting as well.  Therapy Documentation Precautions:  Precautions Precautions: Back Precaution Comments: reviewed back precautions, pt verbalized 3/3 Required Braces or Orthoses: Spinal Brace Spinal Brace: Lumbar corset;Applied in sitting position Restrictions Weight Bearing Restrictions: No  Pain: Pain Assessment Pain Assessment: No/denies pain Pain Score: 0-No pain Pain Type: Surgical pain Pain Location: Back Pain Orientation: Lower Pain Descriptors / Indicators: Aching Pain Frequency: Constant Pain Onset: On-going Patients Stated Pain Goal: 0 Pain Intervention(s): Medication (See eMAR)  Therapy/Group: Individual Therapy  Maddix Heinz OTR/L 05/22/2013, 4:26 PM

## 2013-05-22 NOTE — Evaluation (Signed)
Physical Therapy Assessment and Plan  Patient Details  Name: [name] MRN: [id number] Date of Birth: 1935-12-24  PT Diagnosis: Abnormality of gait, Difficulty walking and Muscle weakness Rehab Potential: Good ELOS: 8 - 10 days   Today's Date: 05/22/2013 Time: 2956-2130 Time Calculation (min): 63 min  Problem List:  Patient Active Problem List   Diagnosis Date Noted  . HNP (herniated nucleus pulposus), lumbar--with radiculopathy 05/21/2013  . Diarrhea 05/21/2013  . Altered mental status 12/02/2011  . Fever 12/02/2011  . Chronic cough 12/02/2011  . HTN (hypertension) 12/02/2011  . Hypothyroidism 12/02/2011    Past Medical History:  Past Medical History  Diagnosis Date  . Prostate cancer     hx of prostate ca  . Hypertension   . Thyroid disease   . Hypothyroidism   . Cough     due to scarring of lung  . GERD (gastroesophageal reflux disease)   . Arthritis   . Complication of anesthesia     sometimes have trouble intubating not last 2 times; limited neck mobility--glidescope used in '08 and '10   Past Surgical History:  Past Surgical History  Procedure Laterality Date  . Lung surgery Right 09    growth on outside of lung  . Tonsillectomy    . Appendectomy    . Shoulder surgery Left     rotator cuff x2  . Back surgery    . Cervical spine surgery    . Knee arthroscopy Right   . Eye surgery Bilateral 10    Assessment & Plan Clinical Impression: Pt is a 77 y.o. male with history of HTN, prostate cancer, diffuse idiopathic skeletal hyperostosis s/p L3/4 fusion who has had LBP radiating to BLE and limiting ambulation. Work up with L4/5 and L5/S1 stenosis with HNP and patient elected to undergo removal of previous L3-L4 screws with bilateral lam L4/5 and L5-S1 diskectomy with screws L4-S1. Post op with fevers as well as diarrhea with incontinence.  Patient transferred to CIR on 05/21/2013 .   Patient currently requires mod with mobility secondary to muscle  weakness and decreased cardiorespiratoy endurance and decreased oxygen support.  Prior to hospitalization, patient was modified independent  with mobility and lived with Spouse in a Other(Comment) (townhome) home.  Home access is 2 entrances: front 1 step to small patio then 1 step into house, no rails, garage entrance ramp to enterStairs to enter.  Patient will benefit from skilled PT intervention to maximize safe functional mobility, minimize fall risk and decrease caregiver burden for planned discharge home with intermittent assist.  Anticipate patient will benefit from follow up [facility] at discharge.  PT - End of Session Activity Tolerance: Tolerates 30+ min activity with multiple rests Endurance Deficit: Yes PT Assessment Rehab Potential: Good Barriers to Discharge: Decreased caregiver support PT Patient demonstrates impairments in the following area(s): Balance;Endurance;Motor;Safety;Pain PT Transfers Functional Problem(s): Bed Mobility;Bed to Chair;Car PT Locomotion Functional Problem(s): Ambulation;Wheelchair Mobility;Stairs PT Plan PT Intensity: Minimum of 1-2 x/day ,45 to 90 minutes PT Frequency: 5 out of 7 days PT Duration Estimated Length of Stay: 8 - 10 days PT Treatment/Interventions: Ambulation/gait training;Balance/vestibular training;Discharge planning;DME/adaptive equipment instruction;Neuromuscular re-education;Pain management;Functional mobility training;Patient/family education;Therapeutic Exercise;Splinting/orthotics;Therapeutic Activities;Stair training;UE/LE Strength taining/ROM;UE/LE Coordination activities;Wheelchair propulsion/positioning PT Transfers Anticipated Outcome(s): mod I with transfers PT Locomotion Anticipated Outcome(s): mod I with ambulation, S with stairs,  PT Recommendation Follow Up Recommendations: Home health PT Patient destination: Home Equipment Recommended: Rolling walker with 5" wheels  Skilled Therapeutic Intervention PT evaluation completed and  treatment plan  initiated. Pt was able to name 3/3 back precautions. Pt requires increased time for transfers or any activity. Pt requires verbal cues for safety and technique while utilizing rolling walker. Pt transfers sit to stand, stand to sit with min to mod A with verbal cues.   PT Evaluation Precautions/Restrictions Precautions Precautions: Back Precaution Comments: reviewed back precautions, pt verbalized 3/3 Spinal Brace: Lumbar corset;Applied in sitting position Restrictions Weight Bearing Restrictions: No General   Vital Signs  Pain Pt c/o 7/10 pain low back and R shoulder.   Home Living/Prior Functioning Home Living Available Help at Discharge: Available PRN/intermittently Type of Home: Other(Comment) (townhome) Home Access: Stairs to enter [employer] of Steps: 2 entrances: front 1 step to small patio then 1 step into house, no rails, garage entrance ramp to enter Home Layout: One level  Lives With: Spouse Prior Function Level of Independence: Independent with gait;Independent with transfers  Able to Take Stairs?: Yes Driving: Yes (occasionally) Vocation: Retired Comments: I with straight cane prior to admission Vision/Perception  Vision - History Baseline Vision: Wears glasses all the time Perception Perception: Within Functional Limits  Cognition Arousal/Alertness: Awake/alert Attention: Sustained Sustained Attention: Appears intact Awareness: Appears intact Safety/Judgment: Appears intact Sensation Sensation Light Touch: Impaired by gross assessment (B feet) Motor  Motor Motor: Within Functional Limits  Mobility Bed Mobility Rolling Left: 4: Min assist;With rail Left Sidelying to Sit: HOB flat;With rails;3: Mod assist Transfers Transfers: Yes Stand Pivot Transfers: 3: Mod assist Locomotion  Ambulation Ambulation: Yes Ambulation/Gait Assistance: 4: Min assist Ambulation Distance (Feet): 80 Feet Assistive device: Rolling  walker Ambulation/Gait Assistance Details: Tactile cues for posture;Verbal cues for sequencing;Verbal cues for technique;Verbal cues for precautions/safety Gait Gait Pattern: Step-through pattern;Decreased step length - right;Decreased step length - left Stairs / Additional Locomotion Stairs: Yes Stairs Assistance: 4: Min assist Stair Management Technique: Two rails Number of Stairs: 3 Height of Stairs: 4  Trunk/Postural Assessment  Cervical Assessment Cervical Assessment: Exceptions to [facility] Thoracic Assessment Thoracic Assessment: Exceptions to [facility] Lumbar Assessment Lumbar Assessment: Exceptions to [facility]  Balance Static Sitting Balance Static Sitting - Balance Support: Feet unsupported;Bilateral upper extremity supported Static Sitting - Level of Assistance: 5: Stand by assistance Extremity Assessment  RLE Assessment RLE Assessment: Exceptions to [facility] RLE AROM (degrees) Overall AROM Right Lower Extremity: Within functional limits for tasks assessed RLE Strength RLE Overall Strength: Deficits RLE Overall Strength Comments: grossly 3-/5-3/5 throughout LLE Assessment LLE Assessment: Exceptions to [facility] LLE AROM (degrees) Overall AROM Left Lower Extremity: Within functional limits for tasks assessed LLE Strength LLE Overall Strength: Deficits LLE Overall Strength Comments: grossly 3-/5-3/5 throughout  FIM:  FIM - Bed/Chair Transfer Bed/Chair Transfer Assistive Devices: Bed rails;Arm rests Bed/Chair Transfer: 3: Supine > Sit: Mod A (lifting assist/Pt. 50-74%/lift 2 legs;3: Sit > Supine: Mod A (lifting assist/Pt. 50-74%/lift 2 legs);3: Bed > Chair or W/C: Mod A (lift or lower assist);3: Chair or W/C > Bed: Mod A (lift or lower assist) FIM - Locomotion: Ambulation Locomotion: Ambulation Assistive Devices: Walker - Rolling Ambulation/Gait Assistance: 4: Min assist Locomotion: Ambulation: 2: Travels 50 - 149 ft with minimal assistance (Pt.>75%) FIM - Locomotion: Stairs Locomotion:  Building control surveyor: Hand rail - 2 Locomotion: Stairs: 1: Up and Down < 4 stairs with minimal assistance (Pt.>75%)   Refer to Care Plan for Long Term Goals  Recommendations for other services: None  Discharge Criteria: Patient will be discharged from PT if patient refuses treatment 3 consecutive times without medical reason, if treatment goals not met, if  there is a change in medical status, if patient makes no progress towards goals or if patient is discharged from hospital.  The above assessment, treatment plan, treatment alternatives and goals were discussed and mutually agreed upon: by patient  [name] 05/22/2013, 3:50 PM

## 2013-05-23 ENCOUNTER — Inpatient Hospital Stay (HOSPITAL_COMMUNITY): Payer: Medicare Other | Admitting: *Deleted

## 2013-05-23 DIAGNOSIS — R339 Retention of urine, unspecified: Secondary | ICD-10-CM

## 2013-05-23 NOTE — Progress Notes (Signed)
[name] is a 77 y.o. male Sep 26, 1935 [id number]  Subjective: Problems urinating this morning. No new problems. Slept well. Feeling OK.  Objective: Vital signs in last 24 hours: Temp:  [97.5 F (36.4 C)-98 F (36.7 C)] 98 F (36.7 C) (10/12 0604) Pulse Rate:  [55-86] 81 (10/12 0604) Resp:  [16-18] 18 (10/12 0604) BP: (102-150)/(53-72) 119/72 mmHg (10/12 0604) SpO2:  [95 %] 95 % (10/12 0604) Weight change:  Last BM Date: 05/20/13  Intake/Output from previous day: 10/11 0701 - 10/12 0700 In: 480 [P.O.:480] Out: 600 [Urine:600] Last cbgs: CBG (last 3)  No results found for this basename: GLUCAP,  in the last 72 hours   Physical Exam General: No apparent distress    HEENT: moist mucosa Lungs: Normal effort. Lungs clear to auscultation, no crackles or wheezes. Cardiovascular: Regular rate and rhythm, no edema Abdomen: S/NT/ND; BS(+) Musculoskeletal:  No change from before Neurological: No new neurological deficits Wounds: N/A    Skin: clear Alert, cooperative   Lab Results: BMET    Component Value Date/Time   NA 139 05/10/2013 1052   K 3.7 05/10/2013 1052   CL 103 05/10/2013 1052   CO2 27 05/10/2013 1052   GLUCOSE 115* 05/10/2013 1052   BUN 16 05/10/2013 1052   CREATININE 0.64 05/10/2013 1052   CALCIUM 9.0 05/10/2013 1052   GFRNONAA >90 05/10/2013 1052   GFRAA >90 05/10/2013 1052   CBC    Component Value Date/Time   WBC 7.9 05/10/2013 1052   RBC 4.42 05/10/2013 1052   HGB 12.9* 05/10/2013 1052   HCT 39.7 05/10/2013 1052   PLT 165 05/10/2013 1052   MCV 89.8 05/10/2013 1052   MCH 29.2 05/10/2013 1052   MCHC 32.5 05/10/2013 1052   RDW 14.8 05/10/2013 1052   LYMPHSABS 1.5 12/04/2011 0510   MONOABS 0.9 12/04/2011 0510   EOSABS 0.1 12/04/2011 0510   BASOSABS 0.0 12/04/2011 0510    Studies/Results: No results found.  Medications: I have reviewed the patient's current medications.  Assessment/Plan:  1. DVT Prophylaxis/Anticoagulation: Pharmaceutical: Lovenox  2.  Pain Management: Prn oxycodone effective.  3. Mood: Resume ativan--uses bid basis. Will discontinue valium. continue cymbalta. LSCW to follow for evaluation.  4. Neuropsych: This patient is capable of making decisions on his own behalf. He is slow in processing and almost has a parkinsonian appearance. Will monitor.  5. Diarrhea: Resolving. Will discontinue senna and add probiotic.  6. HTN: Will monitor with bid checks. continue Norvasc and metoprolol.  7. BPH/Prostate cancer: Continue Avodart, Enablex--discontinue Flomax, and Tofranil as he does not use these anymore. In and out catheter. 8. Hypothyroid: continue supplement.  9. Chronic cough: continue tessalon, mucinex--resume flonsae as well as PPI for chronic GERD which is likely a contributing factor.      Length of stay, days: 2  Sonda Primes , MD 05/23/2013, 8:26 AM

## 2013-05-23 NOTE — Progress Notes (Addendum)
Occupational Therapy Note   Patient Details  Name: [name] MRN: [id number] Date of Birth: [birth year] Today's Date: 05/23/2013 Time:1445-1530 Pain::  Both hamstrings and back  5/10 Individual session   Engaged in bed mobility, sitting EOB, and using AE.  Pt. Was able to go from supine to sit with increased time and minimal cues for technique.  Sat EOB with minimal assist to stay midline.  Verbalized 2/3 BAT precautions.  While sitting, pt kept leaning to left during session.  Scooted to left to sit on more even surface, but pt still leaning to left .  Donned pants with reacher and increased time (moderate assist).  Pt transferred from bed to wc with mod assist.  Left pt in wc with call bell,phone within reach.     Treyvonne, [name] 05/23/2013, 3:32 PM

## 2013-05-24 ENCOUNTER — Inpatient Hospital Stay (HOSPITAL_COMMUNITY): Payer: Medicare Other

## 2013-05-24 ENCOUNTER — Inpatient Hospital Stay (HOSPITAL_COMMUNITY): Payer: Medicare Other | Admitting: Occupational Therapy

## 2013-05-24 DIAGNOSIS — M47817 Spondylosis without myelopathy or radiculopathy, lumbosacral region: Secondary | ICD-10-CM

## 2013-05-24 DIAGNOSIS — IMO0002 Reserved for concepts with insufficient information to code with codable children: Secondary | ICD-10-CM

## 2013-05-24 DIAGNOSIS — Q762 Congenital spondylolisthesis: Secondary | ICD-10-CM

## 2013-05-24 LAB — VITAMIN B12: Vitamin B-12: 611 pg/mL (ref 211–911)

## 2013-05-24 LAB — URINE MICROSCOPIC-ADD ON

## 2013-05-24 LAB — URINALYSIS, ROUTINE W REFLEX MICROSCOPIC
Bilirubin Urine: NEGATIVE
Glucose, UA: NEGATIVE mg/dL
Ketones, ur: NEGATIVE mg/dL
Nitrite: NEGATIVE
Specific Gravity, Urine: 1.025 (ref 1.005–1.030)
pH: 6 (ref 5.0–8.0)

## 2013-05-24 LAB — CBC WITH DIFFERENTIAL/PLATELET
Basophils Relative: 0 % (ref 0–1)
Eosinophils Relative: 2 % (ref 0–5)
HCT: 29.8 % — ABNORMAL LOW (ref 39.0–52.0)
Hemoglobin: 10 g/dL — ABNORMAL LOW (ref 13.0–17.0)
Lymphocytes Relative: 19 % (ref 12–46)
Lymphs Abs: 1.7 10*3/uL (ref 0.7–4.0)
MCV: 88.4 fL (ref 78.0–100.0)
Monocytes Absolute: 1.2 10*3/uL — ABNORMAL HIGH (ref 0.1–1.0)
Monocytes Relative: 14 % — ABNORMAL HIGH (ref 3–12)
RBC: 3.37 MIL/uL — ABNORMAL LOW (ref 4.22–5.81)
WBC: 8.9 10*3/uL (ref 4.0–10.5)

## 2013-05-24 LAB — COMPREHENSIVE METABOLIC PANEL
ALT: 16 U/L (ref 0–53)
CO2: 28 mEq/L (ref 19–32)
Calcium: 8.6 mg/dL (ref 8.4–10.5)
Chloride: 99 mEq/L (ref 96–112)
Creatinine, Ser: 0.75 mg/dL (ref 0.50–1.35)
GFR calc Af Amer: >90 mL/min (ref >90)
GFR calc non Af Amer: 88 mL/min — ABNORMAL LOW (ref >90)
Glucose, Bld: 118 mg/dL — ABNORMAL HIGH (ref 70–99)
Potassium: 3.2 mEq/L — ABNORMAL LOW (ref 3.5–5.1)
Sodium: 134 mEq/L — ABNORMAL LOW (ref 135–145)
Total Bilirubin: 0.5 mg/dL (ref 0.3–1.2)

## 2013-05-24 LAB — TSH: TSH: 1.026 u[IU]/mL (ref 0.350–4.500)

## 2013-05-24 MED ORDER — SENNA 8.6 MG PO TABS: 2.0000 | ORAL_TABLET | Freq: Every evening | ORAL | Status: DC | PRN

## 2013-05-24 MED ORDER — LORAZEPAM 0.5 MG PO TABS
0.5000 mg | ORAL_TABLET | Freq: Two times a day (BID) | ORAL | Status: DC
  Administered 2013-05-24 – 2013-05-26 (×5): 0.5 mg via ORAL
  Filled 2013-05-24 (×7): qty 1

## 2013-05-24 MED ORDER — POTASSIUM CHLORIDE CRYS ER 20 MEQ PO TBCR
20.0000 meq | EXTENDED_RELEASE_TABLET | Freq: Every day | ORAL | Status: DC
  Administered 2013-05-26 – 2013-05-29 (×4): 20 meq via ORAL
  Filled 2013-05-24 (×5): qty 1

## 2013-05-24 MED ORDER — OXYCODONE HCL 5 MG PO TABS
10.0000 mg | ORAL_TABLET | ORAL | Status: DC | PRN
  Administered 2013-05-24 – 2013-05-29 (×14): 10 mg via ORAL
  Filled 2013-05-24 (×14): qty 2

## 2013-05-24 MED ORDER — POTASSIUM CHLORIDE CRYS ER 20 MEQ PO TBCR
20.0000 meq | EXTENDED_RELEASE_TABLET | Freq: Two times a day (BID) | ORAL | Status: AC
  Administered 2013-05-24 – 2013-05-25 (×4): 20 meq via ORAL
  Filled 2013-05-24 (×4): qty 1

## 2013-05-24 MED ORDER — INFLUENZA VAC SPLIT QUAD 0.5 ML IM SUSP
0.5000 mL | INTRAMUSCULAR | Status: AC
  Administered 2013-05-25: 0.5 mL via INTRAMUSCULAR
  Filled 2013-05-24: qty 0.5

## 2013-05-24 NOTE — IPOC Note (Signed)
Overall Plan of Care [facility]) Patient Details Name: [name] MRN: [id number] DOB: [birth year]  Admitting Diagnosis: [facility] Problems: Principal Problem:   HNP (herniated nucleus pulposus), lumbar--with radiculopathy Active Problems:   Chronic cough   HTN (hypertension)   Hypothyroidism   Diarrhea     Functional Problem List: Nursing    PT Balance;Endurance;Motor;Safety;Pain  OT    SLP    TR         Basic ADL's: OT Grooming;Bathing;Dressing;Toileting;Other (comment)     Advanced  ADL's: OT  (pt. would like to make bed/ fold clothes)     Transfers: PT Bed Mobility;Bed to Chair;Car  OT Toilet;Tub/Shower     Locomotion: PT Ambulation;Wheelchair Mobility;Stairs     Additional Impairments: OT  (blurred vision)  SLP        TR      Anticipated Outcomes Item Anticipated Outcome  Self Feeding    Swallowing      Basic self-care  supervision  Toileting  supervision   Bathroom Transfers supervision  Bowel/Bladder     Transfers  mod I with transfers  Locomotion  mod I with ambulation, S with stairs,   Communication     Cognition     Pain     Safety/Judgment      Therapy Plan: PT Intensity: Minimum of 1-2 x/day ,45 to 90 minutes PT Frequency: 5 out of 7 days PT Duration Estimated Length of Stay: 8 - 10 days OT Intensity: Minimum of 1-2 x/day, 45 to 90 minutes OT Frequency: 5 out of 7 days OT Duration/Estimated Length of Stay: 8-10 days         Team Interventions: Nursing Interventions    PT interventions Ambulation/gait training;Balance/vestibular training;Discharge planning;DME/adaptive equipment instruction;Neuromuscular re-education;Pain management;Functional mobility training;Patient/family education;Therapeutic Exercise;Splinting/orthotics;Therapeutic Activities;Stair training;UE/LE Strength taining/ROM;UE/LE Coordination activities;Wheelchair propulsion/positioning  OT Interventions Balance/vestibular training;Discharge  planning;DME/adaptive equipment instruction;Pain management;Patient/family education;Self Care/advanced ADL retraining;Therapeutic Activities;UE/LE Strength taining/ROM;Therapeutic Exercise  SLP Interventions    TR Interventions    SW/CM Interventions Discharge Planning;Psychosocial Support;Patient/Family Education    Team Discharge Planning: Destination: PT-Home ,OT- Home , SLP-  Projected Follow-up: PT-Home health PT, OT-  Home health OT, SLP-  Projected Equipment Needs: PT-Rolling walker with 5" wheels, OT- Tub/shower bench, SLP-  Patient/family involved in discharge planning: PT- Patient,  OT-Patient, SLP-   MD ELOS: 8-10 days Medical Rehab Prognosis:  Excellent Assessment: The patient has been admitted for CIR therapies. The team will be addressing, functional mobility, strength, stamina, balance, safety, adaptive techniques/equipment, self-care, bowel and bladder mgt, patient and caregiver education, pain mgt, NMR, back precautions. Goals have been set at supervision to Cedric Fishman, MD, [facility]      See Team Conference Notes for weekly updates to the plan of care

## 2013-05-24 NOTE — Progress Notes (Signed)
Physical Therapy Session Note  Patient Details  Name: [name] MRN: [id number] Date of Birth: 1936/03/20  Today's Date: 05/24/2013 Time: 1530-1600 Time Calculation (min): 30 min  Short Term Goals: Week 1:  PT Short Term Goal 1 (Week 1): Pt roll R/L with siderails via log rolling with S PT Short Term Goal 2 (Week 1): Pt will transfer supine to edge of bed with S.  PT Short Term Goal 3 (Week 1): Pt will transfer stand pivot with S.  PT Short Term Goal 4 (Week 1): Pt will ambulate with rolling walker 150 feet with S.  PT Short Term Goal 5 (Week 1): Pt will ascend/descend 2 4" steps with rolling walker and S.   Skilled Therapeutic Interventions/Progress Updates:    Session focused on reviewing back precautions (required mod verbal cues and able to recall 2/3), sit to stand transfers (mod A from w/c, min A from elevated mat with emphasis on technique), and gait training with RW (required frequent cueing to stay inside RW and not let walker get too far in front of pt). Pt able to verbalize understanding of correct use of RW but demonstrated little carryover on each trial.   Therapy Documentation Precautions:  Precautions Precautions: Back Precaution Comments: reviewed back precautions, pt verbalized 2/3 Required Braces or Orthoses: Spinal Brace Spinal Brace: Lumbar corset;Applied in sitting position Restrictions Weight Bearing Restrictions: No  Pain: Denies pain at this time. Locomotion : Ambulation Ambulation/Gait Assistance: 4: Min guard   See FIM for current functional status  Therapy/Group: Individual Therapy  Karolee Stamps [facility] 05/24/2013, 4:04 PM

## 2013-05-24 NOTE — Plan of Care (Signed)
Problem: SCI BLADDER ELIMINATION Goal: RH STG MANAGE BLADDER WITH ASSISTANCE STG Manage Bladder With Total Assistance  Outcome: Not Progressing Goal added for total assist as pt requiring I+O caths q 8 hrs

## 2013-05-24 NOTE — Progress Notes (Signed)
Occupational Therapy Session Note  Patient Details  Name: [name] MRN: [id number] Date of Birth: June 06, 1936  Today's Date: 05/24/2013 Time: 9147-8295 Time Calculation (min): 65 min  Short Term Goals: Week 1:  OT Short Term Goal 1 (Week 1): Pt. will perform LB dressing with AE at minimal assist level OT Short Term Goal 2 (Week 1): Pt. will bathe LB with AE at minimal assist level OT Short Term Goal 3 (Week 1): Pt. will transfer to toilet with supervision level OT Short Term Goal 4 (Week 1): Pt. will recall back precautions with 1 questioning cue OT Short Term Goal 5 (Week 1): Pt. will don back brace with supervision  Skilled Therapeutic Interventions/Progress Updates:  Patient with 5/10 complaints of pain in back and LEs, RN made aware Patient found supine in bed. Patient engaged in bed mobility with HOB raised & using bed rails with close supervision and min verbal cues to adhere to back precautions. Patient maintained dynamic sitting balance to eat breakfast with supervision. During breakfast, therapist discussed home set-up with patient and caregiver information. After breakfast, therapist set-up basin with water and patient engaged in UB/LB bathing & dressing. Patient stood for peri care and to pull pants up to waist. Lumbar corset donned in seated position for transitional movements, sit<>stands, OOB activity, and transfers. Patient takes much more than a reasonable amount of time to complete ADL tasks. Patient ambulated with RW a few steps to sit in w/c. Patient left seated in w/c beside bed with call bell & phone within reach. Patient with incontinent bladder episode during session; NT made aware.  Precautions:  Precautions Precautions: Back Precaution Comments: reviewed back precautions, pt verbalized 3/3 Required Braces or Orthoses: Spinal Brace Spinal Brace: Lumbar corset;Applied in sitting position Restrictions Weight Bearing Restrictions: No  See FIM for current  functional status  Therapy/Group: Individual Therapy  Zade Falkner 05/24/2013, 9:02 AM

## 2013-05-24 NOTE — Plan of Care (Signed)
Problem: RH SKIN INTEGRITY Goal: RH STG MAINTAIN SKIN INTEGRITY WITH ASSISTANCE STG Maintain Skin Integrity With MOD Assistance.  Outcome: Not Progressing Added goal of MOD assist

## 2013-05-24 NOTE — Progress Notes (Signed)
Subjective/Complaints: Complains of urine retention. Pain under reasonable control at present.  A 12 point review of systems has been performed and if not noted above is otherwise negative.   Objective: Vital Signs: Blood pressure 146/65, pulse 83, temperature 98.3 F (36.8 C), temperature source Oral, resp. rate 17, weight 96.163 kg (212 lb), SpO2 100.00%. No results found.  Recent Labs  05/24/13 0525  WBC 8.9  HGB 10.0*  HCT 29.8*  PLT 225    Recent Labs  05/24/13 0525  NA 134*  K 3.2*  CL 99  GLUCOSE 118*  BUN 18  CREATININE 0.75  CALCIUM 8.6   CBG (last 3)  No results found for this basename: GLUCAP,  in the last 72 hours  Wt Readings from Last 3 Encounters:  05/21/13 96.163 kg (212 lb)  05/10/13 110.224 kg (243 lb)  12/02/11 95.255 kg (210 lb)    Physical Exam:  Head: Normocephalic and atraumatic.  Eyes: Conjunctivae are normal. Pupils are equal, round, and reactive to light. No scleral icterus.  Neck: Normal range of motion. Neck supple. No JVD present. No tracheal deviation present. No thyromegaly present.  Cardiovascular: Normal rate and regular rhythm. Exam reveals no friction rub.  No murmur heard.  Respiratory: Effort normal and breath sounds normal. No respiratory distress. He has no rales.  Upper airway rhonchi due to oral secretions.  GI: Soft. Bowel sounds are normal.  Musculoskeletal:  Back tender. Wearing LSO  Lymphadenopathy:  He has no cervical adenopathy.  Neurological: He is alert and oriented to person, place, and time. He displays normal reflexes. No cranial nerve deficit. Coordination abnormal.   dysphonic voice. Slow, monotonic speech pattern. Follows basic commands without difficulty. Appears to have delayed processing. Masked facies appearance, bradykinesic  Decreased proprioception left great toe, decreased light touch sensation left great toe. Intact sensation bilateral S1 Motor strength 5/5 in bilateral deltoid, bicep, tricep,  grip 3 minus to 3/5 bilateral hip flexors knee extensors ankle dorsiflexors and plantar flexors with delayed processing and movement. No gross sensory abnormalities seen.    Skin: Skin is warm. He is not diaphoretic.  Back incision intact with staples and serosanguineous drainage from mid to lower back area--2 soaked ABD pads in place.    Assessment/Plan: 1. Functional deficits secondary to lumbar stenosis with spondylolisthesis and myelopathy, DISH with prior fusion which require 3+ hours per day of interdisciplinary therapy in a comprehensive inpatient rehab setting. Physiatrist is providing close team supervision and 24 hour management of active medical problems listed below. Physiatrist and rehab team continue to assess barriers to discharge/monitor patient progress toward functional and medical goals. FIM: FIM - Bathing Bathing Steps Patient Completed: Chest;Right Arm;Left Arm;Abdomen;Front perineal area Bathing: 3: Mod-Patient completes 5-7 48f 10 parts or 50-74%  FIM - Upper Body Dressing/Undressing Upper body dressing/undressing steps patient completed: Thread/unthread right sleeve of pullover shirt/dresss;Thread/unthread left sleeve of pullover shirt/dress;Put head through opening of pull over shirt/dress Upper body dressing/undressing: 4: Min-Patient completed 75 plus % of tasks FIM - Lower Body Dressing/Undressing Lower body dressing/undressing steps patient completed: Thread/unthread right underwear leg Lower body dressing/undressing: 1: Total-Patient completed less than 25% of tasks  FIM - Toileting Toileting: 1: Total-Patient completed zero steps, helper did all 3  FIM - Diplomatic Services operational officer Devices: Building control surveyor Transfers: 3-To toilet/BSC: Mod A (lift or lower assist);2-From toilet/BSC: Max A (lift and lower assist) (assist level varies with fatigue)  FIM - Bed/Chair Transfer Bed/Chair Transfer Assistive Devices: Bed rails;HOB  elevated Bed/Chair  Transfer: 3: Bed > Chair or W/C: Mod A (lift or lower assist);4: Supine > Sit: Min A (steadying Pt. > 75%/lift 1 leg)  FIM - Locomotion: Ambulation Locomotion: Ambulation Assistive Devices: Walker - Rolling Ambulation/Gait Assistance: 4: Min guard Locomotion: Ambulation: 4: Travels 150 ft or more with minimal assistance (Pt.>75%)  Comprehension Comprehension Mode: Auditory Comprehension: 5-Follows basic conversation/direction: With extra time/assistive device  Expression Expression Mode: Verbal Expression: 5-Expresses basic needs/ideas: With no assist  Social Interaction Social Interaction: 6-Interacts appropriately with others with medication or extra time (anti-anxiety, antidepressant).  Problem Solving Problem Solving: 3-Solves basic 50 - 74% of the time/requires cueing 25 - 49% of the time  Memory Memory: 4-Recognizes or recalls 75 - 89% of the time/requires cueing 10 - 24% of the time  Medical Problem List and Plan:  1. DVT Prophylaxis/Anticoagulation: Pharmaceutical: Lovenox  2. Pain Management: Prn oxycodone effective.  3. Mood: Resume ativan--uses bid basis. Will discontinue valium. continue cymbalta. LSCW to follow for evaluation.  4. Neuropsych: This patient is capable of making decisions on his own behalf. He is slow in processing and almost has a parkinsonian appearance. Will monitor.  5. Diarrhea: Resolving. Will discontinue senna and add probiotic.  6. HTN: Will monitor with bid checks. continue Norvasc and metoprolol.  7. BPH/Prostate cancer: Continue Avodart, Enablex--discontinue Flomax, and Tofranil as he does not use these anymore.   -check ua and culture today as well 8. Hypothyroid: continue supplement.  9. Chronic cough: continue tessalon, mucinex--resume flonsae as well as PPI for chronic GERD which is likely a contributing factor.  LOS (Days) 3 A FACE TO FACE EVALUATION WAS PERFORMED  SWARTZ,ZACHARY T 05/24/2013 8:15 AM

## 2013-05-24 NOTE — Progress Notes (Signed)
Patient information reviewed and entered into eRehab system by Nile Prisk, RN, CRRN, PPS Coordinator.  Information including medical coding and functional independence measure will be reviewed and updated through discharge.     Per nursing patient was given "Data Collection Information Summary for Patients in Inpatient Rehabilitation Facilities with attached "Privacy Act Statement-Health Care Records" upon admission.  

## 2013-05-24 NOTE — Progress Notes (Signed)
Social Work  Social Work Assessment and Plan  Patient Details  Name: [name] MRN: [id number] Date of Birth: [birth year]  Today's Date: 05/24/2013  Problem List:  Patient Active Problem List   Diagnosis Date Noted  . HNP (herniated nucleus pulposus), lumbar--with radiculopathy 05/21/2013  . Diarrhea 05/21/2013  . Altered mental status 12/02/2011  . Fever 12/02/2011  . Chronic cough 12/02/2011  . HTN (hypertension) 12/02/2011  . Hypothyroidism 12/02/2011   Past Medical History:  Past Medical History  Diagnosis Date  . Prostate cancer     hx of prostate ca  . Hypertension   . Thyroid disease   . Hypothyroidism   . Cough     due to scarring of lung  . GERD (gastroesophageal reflux disease)   . Arthritis   . Complication of anesthesia     sometimes have trouble intubating not last 2 times; limited neck mobility--glidescope used in '08 and '10   Past Surgical History:  Past Surgical History  Procedure Laterality Date  . Lung surgery Right 09    growth on outside of lung  . Tonsillectomy    . Appendectomy    . Shoulder surgery Left     rotator cuff x2  . Back surgery    . Cervical spine surgery    . Knee arthroscopy Right   . Eye surgery Bilateral 10   Social History:  reports that he quit smoking about 31 years ago. His smoking use included Cigarettes. He has a 45 pack-year smoking history. He does not have any smokeless tobacco history on file. He reports that he does not drink alcohol or use illicit drugs.  Family / Support Systems Marital Status: Married How Long?: 2002 (each a 2nd marriage) Patient Roles: Spouse Spouse/Significant Other: wife, Arville Lime @ (H) [phone] or ([phone] Children: pt has a daughter living in [place];  wife with 4 children and all in [place] Anticipated Caregiver: wife Ability/Limitations of Caregiver: no limitations; works Systems developer at [employer] but will plan not to work Engineer, structural Availability: 24/7 Family Dynamics: pt denies  any concerns about wife's availablility/ wilingness to assist at d/c.  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  Actually looks to almost doze off and then an answer is provided.  Denies any emotional distress.  No h/o depression / anxiety.  Will monitor Recent Psychosocial Issues: None Pyschiatric History: None Substance Abuse History: None  Patient / Family Perceptions, Expectations & Goals Pt/Family understanding of illness & functional limitations: pt and wife with basic understanding of surgery performed and (back) restrictions.  Both understand need for and purpose of CIR prior to return home. Premorbid pt/family roles/activities: Pt independent overall, however, fairly sedentary.  Wife working p/t job Anticipated changes in roles/activities/participation: wife plans to stop working for now to provide any assist he may need Pt/family expectations/goals: "build my strength up"  [employer]: None Premorbid Home Care/DME Agencies: None Transportation available at discharge: yes  Discharge Planning Living Arrangements: Spouse/significant other Support Systems: Spouse/significant other Type of Residence: Engineer, materials residence Civil engineer, contracting: Administrator (specify) Herbalist) Financial Resources: Restaurant manager, fast food Screen Referred: No Living Expenses: Own Money Management: Spouse Does the  patient have any problems obtaining your medications?: No Home  Management: wife Patient/Family Preliminary Plans: pt plans to return home with his wife as primary caregiver Social Work Anticipated Follow Up Needs: HH/OP Expected length of stay: ELOS 1 to 2 weeks  Clinical Impression Pleasant, oriented gentleman here after back surgery, however, very slow with responses!  Denies any emotional distress.  Good support of wife who can provide 24/7 assist at d/c.  Will follow for support and d/c planning.  Gershon Shorten 05/24/2013, 4:07 PM

## 2013-05-24 NOTE — Progress Notes (Signed)
Inpatient Rehabilitation Center Individual Statement of Services  Patient Name:  [name]  Date:  05/24/2013  Welcome to the Inpatient Rehabilitation Center.  Our goal is to provide you with an individualized program based on your diagnosis and situation, designed to meet your specific needs.  With this comprehensive rehabilitation program, you will be expected to participate in at least 3 hours of rehabilitation therapies Monday-Friday, with modified therapy programming on the weekends.  Your rehabilitation program will include the following services:  Physical Therapy (PT), Occupational Therapy (OT), 24 hour per day rehabilitation nursing, Case Management (Social Worker), Rehabilitation Medicine, Nutrition Services and Pharmacy Services  Weekly team conferences will be held on Tuesdays to discuss your progress.  Your Social Worker will talk with you frequently to get your input and to update you on team discussions.  Team conferences with you and your family in attendance may also be held.  Expected length of stay: 8-10 days       Overall anticipated outcome: supervision  Depending on your progress and recovery, your program may change. Your Social Worker will coordinate services and will keep you informed of any changes. Your Social Worker's name and contact numbers are listed  below.  The following services may also be recommended but are not provided by the Inpatient Rehabilitation Center:   Driving Evaluations  Home Health Rehabiltiation Services  Outpatient Rehabilitation Services    Arrangements will be made to provide these services after discharge if needed.  Arrangements include referral to agencies that provide these services.  Your insurance has been verified to be:  Medicare and BCBS Your primary doctor is:  Dr. Earl Gala  Pertinent information will be shared with your doctor and your insurance company.  Social Worker:  [place], [place] [phone] or ([phone]    Information discussed with and copy given to patient by: Amada Jupiter, 05/24/2013, 4:09 PM

## 2013-05-24 NOTE — Progress Notes (Signed)
Occupational Therapy Session Note  Patient Details  Name: [name] MRN: [id number] Date of Birth: 12-Jun-1936  Today's Date: 05/24/2013 Time: 1100-1157 Time Calculation (min): 57 min   Skilled Therapeutic Interventions/Progress Updates:    Patient seen this am for 1:1 OT to address functional mobility.  Patient able to verbally state 2 of 3 back precautions, yet never broke precautions with various transitional movements.  Wife present for OT session as observer.  Patient required max assist to transition from stand to sit initially.  Patient very reliant on bilateral upper extremities for this transition, and therefore mass falling well behind base of support, and decreased balance posterior.  Worked on scooting to edge of sitting surface, and positioning feet behind knees, and encouraging forward weight shift from hips, with increased lower extremity activation for standing.  Patient able to stand with moderate and then minimal assistance.  Also practiced transitioning from stand to sitting in a controlled descent.  Patient hesitant to flex knees for this task.  Worked to strengthen leg muscles by addressing components of sit to stand, and during mid ranges during this session.  Practiced bed mobility in ADL apartment.  Patient moves very slowly, and required moderate assistance to roll from supine to sidelying without bedrails.  Patient would benefit from further practice to decrease burden of care.    Therapy Documentation Precautions:  Precautions Precautions: Back Precaution Comments: reviewed back precautions, pt verbalized 3/3 Required Braces or Orthoses: Spinal Brace Spinal Brace: Lumbar corset;Applied in sitting position Restrictions Weight Bearing Restrictions: No   Pain: 6/10 at rest, 8/10 with activity. Patient does not want additional pain medication, and only discussed pain when asked.  Encouraged patient to speak with MD and RN for best pain management techniques to  further allow participation in therapy, but not increase lethargy.      See FIM for current functional status  Therapy/Group: Individual Therapy  Collier Salina 05/24/2013, 11:57 AM

## 2013-05-24 NOTE — Plan of Care (Signed)
Problem: SCI BOWEL ELIMINATION Goal: RH STG MANAGE BOWEL WITH ASSISTANCE STG Manage Bowel with Assistance.  Outcome: Not Progressing No BM since 10/10

## 2013-05-24 NOTE — Progress Notes (Signed)
Occupational Therapy Session Note  Patient Details  Name: [name] MRN: [id number] Date of Birth: 26-Sep-1935  Today's Date: 05/24/2013 Time: 4098-1191 Time Calculation (min): 45 min  Short Term Goals: Week 1:  OT Short Term Goal 1 (Week 1): Pt. will perform LB dressing with AE at minimal assist level OT Short Term Goal 2 (Week 1): Pt. will bathe LB with AE at minimal assist level OT Short Term Goal 3 (Week 1): Pt. will transfer to toilet with supervision level OT Short Term Goal 4 (Week 1): Pt. will recall back precautions with 1 questioning cue OT Short Term Goal 5 (Week 1): Pt. will don back brace with supervision  Skilled Therapeutic Interventions/Progress Updates:    1:1 Pt in bed when arrived. Focused on bed mobility without the bed rail to come to EOB, use of reacher to don pants with min A to get over the gripper socks, donning brace with min questioning cues, sit to stands and stand pivot transfers. In the gym continued work on sit to stands without use of RW, standing balance with maintaining upright posture for strengthening back mm, forward stepping one step alternating right/left without Ue support on RW.   Therapy Documentation Precautions:  Precautions Precautions: Back Precaution Comments: reviewed back precautions, pt verbalized 3/3 Required Braces or Orthoses: Spinal Brace Spinal Brace: Lumbar corset;Applied in sitting position Restrictions Weight Bearing Restrictions: No Pain: 5/10 willing to continue- helped reposition See FIM for current functional status  Therapy/Group: Individual Therapy  Roney Mans [facility] 05/24/2013, 2:36 PM

## 2013-05-24 NOTE — Progress Notes (Addendum)
Nurse called to state wife wants a CT of head because patient "not acting right"-- No details given. Long discussion with wife who reports that patient has had decline in the past year and her frustration in trying to get some help. Questions medication effect v/s onset of dementia. Sedentary with poor energy levels, watches TV, behavior/cognitive changes, as well as tremors noted. Patient concurred that it's harder for him to get thoughts together and that he has noticed cognitive issues also. Denies any family history of neurological problems   Will decrease neuro-sedating medications--check B12, TSH levels. Check CT head to rule out organic cause. Will monitor for improvement with decrease in meds. Will decrease ativan to 0.5 mg bid and decrease oxycodone to 10 mg prn

## 2013-05-25 ENCOUNTER — Inpatient Hospital Stay (HOSPITAL_COMMUNITY): Payer: Medicare Other

## 2013-05-25 ENCOUNTER — Inpatient Hospital Stay (HOSPITAL_COMMUNITY): Payer: Medicare Other | Admitting: Occupational Therapy

## 2013-05-25 DIAGNOSIS — M47817 Spondylosis without myelopathy or radiculopathy, lumbosacral region: Secondary | ICD-10-CM

## 2013-05-25 DIAGNOSIS — Q762 Congenital spondylolisthesis: Secondary | ICD-10-CM

## 2013-05-25 DIAGNOSIS — IMO0002 Reserved for concepts with insufficient information to code with codable children: Secondary | ICD-10-CM

## 2013-05-25 MED ORDER — TAMSULOSIN HCL 0.4 MG PO CAPS
0.4000 mg | ORAL_CAPSULE | Freq: Every day | ORAL | Status: DC
  Administered 2013-05-25 – 2013-05-29 (×5): 0.4 mg via ORAL
  Filled 2013-05-25 (×7): qty 1

## 2013-05-25 MED ORDER — CIPROFLOXACIN HCL 500 MG PO TABS
500.0000 mg | ORAL_TABLET | Freq: Two times a day (BID) | ORAL | Status: DC
  Administered 2013-05-25 – 2013-05-29 (×8): 500 mg via ORAL
  Filled 2013-05-25 (×10): qty 1

## 2013-05-25 NOTE — Progress Notes (Signed)
Physical Therapy Session Note  Patient Details  Name: [name] MRN: [id number] Date of Birth: 03-31-1936  Today's Date: 05/25/2013 Time: 1030-1128 Time Calculation (min): 58 min  Short Term Goals: Week 1:  PT Short Term Goal 1 (Week 1): Pt roll R/L with siderails via log rolling with S PT Short Term Goal 2 (Week 1): Pt will transfer supine to edge of bed with S.  PT Short Term Goal 3 (Week 1): Pt will transfer stand pivot with S.  PT Short Term Goal 4 (Week 1): Pt will ambulate with rolling walker 150 feet with S.  PT Short Term Goal 5 (Week 1): Pt will ascend/descend 2 4" steps with rolling walker and S.   Skilled Therapeutic Interventions/Progress Updates:    Session focused on gait training for endurance and strengthening with RW (Cues to maintain upright posture and correct positioning of RW) > 200', simulated car transfer with S using RW, standing therex including mini squats and heel/toe raises (10 reps each), and curb step negotiation for home entry with RW (min A).   Therapy Documentation Precautions:  Precautions Precautions: Back Precaution Comments: reviewed back precautions, pt verbalized 3/3 Required Braces or Orthoses: Spinal Brace Spinal Brace: Lumbar corset;Applied in sitting position Restrictions Weight Bearing Restrictions: No Pain:  Intermittent pain in LE's; relieved with repositioning and rest breaks. Locomotion : Ambulation Ambulation/Gait Assistance: 5: Supervision   See FIM for current functional status  Therapy/Group: Individual Therapy  Karolee Stamps [facility] 05/25/2013, 11:37 AM

## 2013-05-25 NOTE — Progress Notes (Signed)
Physical Therapy Session Note  Patient Details  Name: MELISSA PULIDO MRN: [id number] Date of Birth: 1935-08-16  Today's Date: 05/25/2013 Time: 1440-1510 Time Calculation (min): 30 min  Short Term Goals: Week 1:  PT Short Term Goal 1 (Week 1): Pt roll R/L with siderails via log rolling with S PT Short Term Goal 2 (Week 1): Pt will transfer supine to edge of bed with S.  PT Short Term Goal 3 (Week 1): Pt will transfer stand pivot with S.  PT Short Term Goal 4 (Week 1): Pt will ambulate with rolling walker 150 feet with S.  PT Short Term Goal 5 (Week 1): Pt will ascend/descend 2 4" steps with rolling walker and S.   Skilled Therapeutic Interventions/Progress Updates:    Session focused on overall functional strengthening and endurance with gait on unit (slow cadence, cues for placement of body inside RW and for posture) and Nustep on level 4 x 10 min. Min A needed for transfers this PM and cues for technique.   Therapy Documentation Precautions:  Precautions Precautions: Back Precaution Comments: reviewed back precautions, pt verbalized 3/3 Required Braces or Orthoses: Spinal Brace Spinal Brace: Lumbar corset;Applied in sitting position Restrictions Weight Bearing Restrictions: No   Pain:  c/o pain in LE's and back - RN notified for medication.   See FIM for current functional status  Therapy/Group: Individual Therapy  Karolee Stamps [facility] 05/25/2013, 3:14 PM

## 2013-05-25 NOTE — Consult Note (Signed)
Urology Consult   Physician requesting consult: Riley Kill  Reason for consult: Acute urinary retention  History of Present Illness: [name] is a 77 y.o. with PMH significant for CaP, HTN, hypothyroidism, and diffuse idiopathic skeletal hyperostosis s/p bilateral LAM L 4/5 and L5/S1 diskectomy with screws L4-S1.  Pt had foley removed post op and he developed retention.  Foley was replaced with removal again prior to transfer to CIR on 05/21/13.  Since then he has had frequency with SUI and elevated PVRs.  He has had I/O cathing performed q6-8 hours for several days.  He is voiding some on his own.   Pt has known hx of a small capacity bladder with BOO sx including urinary frequency, urgency, SUI, and nocturia.  He has prostate cancer and is s/p TURP.  He has been taking Avodart and enablex.  He was taking flomax but this was stopped in the recent past as the pt did not feel he was benefiting from it.    He is currently working with OT and is without complaint.  He denies F/C, CP, SOB, N/V, and abdominal pain.  He has constipation.    Past Medical History  Diagnosis Date  . Prostate cancer     hx of prostate ca  . Hypertension   . Thyroid disease   . Hypothyroidism   . Cough     due to scarring of lung  . GERD (gastroesophageal reflux disease)   . Arthritis   . Complication of anesthesia     sometimes have trouble intubating not last 2 times; limited neck mobility--glidescope used in '08 and '10    Past Surgical History  Procedure Laterality Date  . Lung surgery Right 09    growth on outside of lung  . Tonsillectomy    . Appendectomy    . Shoulder surgery Left     rotator cuff x2  . Back surgery    . Cervical spine surgery    . Knee arthroscopy Right   . Eye surgery Bilateral 10    Current Hospital Medications:  Home Meds:    Medication List    ASK your doctor about these medications       amLODipine 5 MG tablet  Commonly known as:  NORVASC  Take 10 mg by mouth  daily. Hold if SBP <100     benzonatate 100 MG capsule  Commonly known as:  TESSALON  Take 100 mg by mouth 2 (two) times daily.     diazepam 5 MG tablet  Commonly known as:  VALIUM  Take 5 mg by mouth 2 (two) times daily as needed for anxiety.     DULoxetine 60 MG capsule  Commonly known as:  CYMBALTA  Take 60 mg by mouth daily.     dutasteride 0.5 MG capsule  Commonly known as:  AVODART  Take 0.5 mg by mouth daily.     guaiFENesin 600 MG 12 hr tablet  Commonly known as:  MUCINEX  Take 600 mg by mouth 2 (two) times daily.     ibuprofen 800 MG tablet  Commonly known as:  ADVIL,MOTRIN  Take 800 mg by mouth every 8 (eight) hours as needed for pain.     imipramine 25 MG tablet  Commonly known as:  TOFRANIL  Take 25 mg by mouth 2 (two) times daily.     levothyroxine 75 MCG tablet  Commonly known as:  SYNTHROID, LEVOTHROID  Take 75 mcg by mouth See admin instructions. Take on Saturdays and  Sundays only.     levothyroxine 50 MCG tablet  Commonly known as:  SYNTHROID, LEVOTHROID  Take 50 mcg by mouth See admin instructions. Take on Mondays, Tuesdays, Wednesdays, Thursdays, and Fridays.     LORazepam 1 MG tablet  Commonly known as:  ATIVAN  Take 1 mg by mouth 2 (two) times daily.     metoprolol tartrate 25 MG tablet  Commonly known as:  LOPRESSOR  Take 25 mg by mouth 2 (two) times daily.     omeprazole 20 MG capsule  Commonly known as:  PRILOSEC  Take 20 mg by mouth 2 (two) times daily.     oxyCODONE 15 MG immediate release tablet  Commonly known as:  ROXICODONE  Take 30 mg by mouth every 6 (six) hours as needed. For pain.     senna 8.6 MG Tabs tablet  Commonly known as:  SENOKOT  Take 2 tablets by mouth at bedtime.     solifenacin 10 MG tablet  Commonly known as:  VESICARE  Take 10 mg by mouth daily.     Vitamin D (Ergocalciferol) 50000 UNITS Caps capsule  Commonly known as:  DRISDOL  Take 50,000 Units by mouth every 7 (seven) days. Patient tries to take on  Mondays or Tuesdays.        Scheduled Meds: . amLODipine  10 mg Oral Daily  . benzonatate  100 mg Oral BID  . DULoxetine  60 mg Oral Daily  . dutasteride  0.5 mg Oral Daily  . enoxaparin (LOVENOX) injection  40 mg Subcutaneous Q24H  . fluticasone  1 spray Each Nare Daily  . guaiFENesin  600 mg Oral BID  . levothyroxine  50 mcg Oral Custom  . levothyroxine  75 mcg Oral Custom  . LORazepam  0.5 mg Oral BID  . metoprolol tartrate  25 mg Oral BID  . pantoprazole  20 mg Oral BID  . potassium chloride  20 mEq Oral BID  . [START ON 05/26/2013] potassium chloride  20 mEq Oral Daily  . saccharomyces boulardii  250 mg Oral BID  . tamsulosin  0.4 mg Oral Daily   Continuous Infusions:  PRN Meds:.acetaminophen, alum & mag hydroxide-simeth, bisacodyl, diphenhydrAMINE, guaiFENesin-dextromethorphan, menthol-cetylpyridinium, methocarbamol, oxyCODONE, phenol, prochlorperazine, prochlorperazine, prochlorperazine, senna, traZODone  Allergies: No Known Allergies  No family history on file.  Social History:  reports that he quit smoking about 31 years ago. His smoking use included Cigarettes. He has a 45 pack-year smoking history. He does not have any smokeless tobacco history on file. He reports that he does not drink alcohol or use illicit drugs.  ROS: A complete review of systems was performed.  All systems are negative except for pertinent findings as noted.  Physical Exam:  Vital signs in last 24 hours: Temp:  [97.7 F (36.5 C)] 97.7 F (36.5 C) (10/14 0523) Pulse Rate:  [84] 84 (10/14 0523) Resp:  [18] 18 (10/14 0523) BP: (128-155)/(66-76) 155/66 mmHg (10/14 0840) SpO2:  [95 %] 95 % (10/14 0523) Constitutional:  Alert and oriented, No acute distress Cardiovascular: Regular rate and rhythm Respiratory: Normal respiratory effort, Lungs clear bilaterally GI: Abdomen is soft, nontender, non-distended GU: No CVA tenderness Lymphatic: No lymphadenopathy Psychiatric: Normal mood and  affect  Laboratory Data:   Recent Labs  05/24/13 0525  WBC 8.9  HGB 10.0*  HCT 29.8*  PLT 225     Recent Labs  05/24/13 0525  NA 134*  K 3.2*  CL 99  GLUCOSE 118*  BUN 18  CALCIUM  8.6  CREATININE 0.75     Results for orders placed during the hospital encounter of 05/21/13 (from the past 24 hour(s))  URINE CULTURE     Status: None   Collection Time    05/24/13  6:00 PM      Result Value Range   Specimen Description URINE, RANDOM     Special Requests NONE     Culture  Setup Time       Value: 05/24/2013 22:10     Performed at [facility] Count       Value: >=100,000 COLONIES/ML     Performed at [facility]   Culture       Value: GRAM NEGATIVE RODS     Performed at [facility]   Report Status PENDING    URINALYSIS, ROUTINE W REFLEX MICROSCOPIC     Status: Abnormal   Collection Time    05/24/13  6:00 PM      Result Value Range   Color, Urine YELLOW  YELLOW   APPearance HAZY (*) CLEAR   Specific Gravity, Urine 1.025  1.005 - 1.030   pH 6.0  5.0 - 8.0   Glucose, UA NEGATIVE  NEGATIVE mg/dL   Hgb urine dipstick SMALL (*) NEGATIVE   Bilirubin Urine NEGATIVE  NEGATIVE   Ketones, ur NEGATIVE  NEGATIVE mg/dL   Protein, ur 30 (*) NEGATIVE mg/dL   Urobilinogen, UA 1.0  0.0 - 1.0 mg/dL   Nitrite NEGATIVE  NEGATIVE   Leukocytes, UA MODERATE (*) NEGATIVE  URINE MICROSCOPIC-ADD ON     Status: Abnormal   Collection Time    05/24/13  6:00 PM      Result Value Range   Squamous Epithelial / LPF RARE  RARE   WBC, UA 21-50  <3 WBC/hpf   RBC / HPF 0-2  <3 RBC/hpf   Bacteria, UA FEW (*) RARE   Urine-Other MUCOUS PRESENT     Recent Results (from the past 240 hour(s))  URINE CULTURE     Status: None   Collection Time    05/24/13  6:00 PM      Result Value Range Status   Specimen Description URINE, RANDOM   Final   Special Requests NONE   Final   Culture  Setup Time     Final   Value: 05/24/2013 22:10     Performed at [facility] Count     Final   Value: >=100,000 COLONIES/ML     Performed at [facility]   Culture     Final   Value: GRAM NEGATIVE RODS     Performed at [facility]   Report Status PENDING   Incomplete    Renal Function:  Recent Labs  05/24/13 0525  CREATININE 0.75   The CrCl is unknown because both a height and weight (above a minimum accepted value) are required for this calculation.  Radiologic Imaging: Ct Head Wo Contrast  05/24/2013   CLINICAL DATA:  Cognitive decline over past years. Tremors.  EXAM: CT HEAD WITHOUT CONTRAST  TECHNIQUE: Contiguous axial images were obtained from the base of the skull through the vertex without intravenous contrast.  COMPARISON:  CT 12/02/2011  FINDINGS: Mild to moderate atrophy. Negative for hydrocephalus. Negative for acute infarct. No significant chronic ischemia. Negative for hemorrhage or mass lesion. Paranasal sinuses are clear. No skull lesion identified.  IMPRESSION: No acute abnormality. Atrophy is typical for age.   Electronically Signed  By: Marlan Palau M.D.   On: 05/24/2013 14:45    Impression/Recommendation  Acute urinary retention in post op pt with chronic bladder outlet obstruction sx--insert and leave foley for 5-7 days.  Stop Enablex as it is an anticholinergic and restart flomax to aid with voiding.  Continue Avodart.  Will need repeat void trial which can be performed as an inpt or as an outpt in Dr. Imelda Pillow office.    Pt has UTI which could be contributing to retention and sx.  Bacterial identification and susceptibilities pending.  F/u and adjust Abx as necessary.  Will start empiric Cipro.    Silas Flood 05/25/2013, 3:04 PM

## 2013-05-25 NOTE — Progress Notes (Signed)
Nursing Note: Pt in and out cathed by NT for 600 cc of clear yellow urine. Pt tolerated well.Bladder scan for 489.Unable to void .wbb

## 2013-05-25 NOTE — Progress Notes (Signed)
Occupational Therapy Session Notes  Patient Details  Name: [name] MRN: [id number] Date of Birth: May 16, 1936  Today's Date: 05/25/2013  Short Term Goals: Week 1:  OT Short Term Goal 1 (Week 1): Pt. will perform LB dressing with AE at minimal assist level OT Short Term Goal 2 (Week 1): Pt. will bathe LB with AE at minimal assist level OT Short Term Goal 3 (Week 1): Pt. will transfer to toilet with supervision level OT Short Term Goal 4 (Week 1): Pt. will recall back precautions with 1 questioning cue OT Short Term Goal 5 (Week 1): Pt. will don back brace with supervision  Skilled Therapeutic Interventions/Progress Updates:   Session #1 [id number] - 60 Minutes Individual Therapy Patient with complaints of pain in LEs, RN aware and administered pain medication Patient found supine in bed. Patient engaged in bed mobility, sat edge of bed for therapist to donn lumbar corset, then stood with RW and ambulated into bathroom. Patient performed shower stall transfer onto tub transfer bench for ADL retraining at shower level. Therapist introduced long handled sponge in increase independence with LB bathing. After bathing, patient sat in shower to dry off and donned shirt then lumbar corset. Therapist notified RN of lumbar corset not fitting right; RN notified biotech. Patient ambulated -> w/c seated beside bed for LB dressing using reacher. Patient with bladder incontinence when standing to pull pants up. Therapist assisted with clean-up. At end of session, patient left seated in front of sink to complete grooming tasks, patient's wife present at end of session; call bell & phone were left within reach.    Session #2 [id number] - 45 Minutes Individual Therapy No complaints of pain Patient found supine in bed finishing lunch. Patient engaged in bed mobility and sat edge of bed to focus on dynamic sitting balance/tolerance/endurance, donning of socks using sock aid and donning shoes using reacher  and long handled shoe horn. After donning of shoes, patient stood with rolling walker and took a few steps -> w/c. Patient sat in w/c with min assist. Therapist left patient seated in w/c beside bed with call bell & phone within reach. Wife present at end of session. Patient takes much more than reasonable amount of time to complete tasks.   Precautions:  Precautions Precautions: Back Precaution Comments: reviewed back precautions, pt verbalized 3/3 Required Braces or Orthoses: Spinal Brace Spinal Brace: Lumbar corset;Applied in sitting position Restrictions Weight Bearing Restrictions: No  See FIM for current functional status  Jakiyah Stepney 05/25/2013, 7:31 AM

## 2013-05-25 NOTE — Progress Notes (Signed)
Subjective/Complaints: Concerned about urine retention. Pain under fair control. A 12 point review of systems has been performed and if not noted above is otherwise negative.   Objective: Vital Signs: Blood pressure 128/76, pulse 84, temperature 97.7 F (36.5 C), temperature source Oral, resp. rate 18, weight 96.163 kg (212 lb), SpO2 95.00%. Ct Head Wo Contrast  05/24/2013   CLINICAL DATA:  Cognitive decline over past years. Tremors.  EXAM: CT HEAD WITHOUT CONTRAST  TECHNIQUE: Contiguous axial images were obtained from the base of the skull through the vertex without intravenous contrast.  COMPARISON:  CT 12/02/2011  FINDINGS: Mild to moderate atrophy. Negative for hydrocephalus. Negative for acute infarct. No significant chronic ischemia. Negative for hemorrhage or mass lesion. Paranasal sinuses are clear. No skull lesion identified.  IMPRESSION: No acute abnormality. Atrophy is typical for age.   Electronically Signed   By: Marlan Palau M.D.   On: 05/24/2013 14:45    Recent Labs  05/24/13 0525  WBC 8.9  HGB 10.0*  HCT 29.8*  PLT 225    Recent Labs  05/24/13 0525  NA 134*  K 3.2*  CL 99  GLUCOSE 118*  BUN 18  CREATININE 0.75  CALCIUM 8.6   CBG (last 3)  No results found for this basename: GLUCAP,  in the last 72 hours  Wt Readings from Last 3 Encounters:  05/21/13 96.163 kg (212 lb)  05/10/13 110.224 kg (243 lb)  12/02/11 95.255 kg (210 lb)    Physical Exam:  Head: Normocephalic and atraumatic.  Eyes: Conjunctivae are normal. Pupils are equal, round, and reactive to light. No scleral icterus.  Neck: Normal range of motion. Neck supple. No JVD present. No tracheal deviation present. No thyromegaly present.  Cardiovascular: Normal rate and regular rhythm. Exam reveals no friction rub.  No murmur heard.  Respiratory: Effort normal and breath sounds normal. No respiratory distress. He has no rales.  Upper airway rhonchi due to oral secretions.  GI: Soft. Bowel sounds  are normal.  Musculoskeletal:  Back tender. Wearing LSO  Lymphadenopathy:  He has no cervical adenopathy.  Neurological: He is alert and oriented to person, place, and time. He displays normal reflexes. No cranial nerve deficit. Coordination abnormal.   dysphonic voice. Slow, monotonic speech pattern. Follows basic commands without difficulty. Appears to have delayed processing. Masked facies appearance, bradykinesic  Decreased proprioception left great toe, decreased light touch sensation left great toe. Intact sensation bilateral S1 Motor strength 5/5 in bilateral deltoid, bicep, tricep, grip 3 minus to 3/5 bilateral hip flexors knee extensors ankle dorsiflexors and plantar flexors with delayed processing and movement. No gross sensory abnormalities seen.    Skin: Skin is warm. He is not diaphoretic.  Back incision intact with staples and minimal drainage  Assessment/Plan: 1. Functional deficits secondary to lumbar stenosis with spondylolisthesis and myelopathy, DISH with prior fusion which require 3+ hours per day of interdisciplinary therapy in a comprehensive inpatient rehab setting. Physiatrist is providing close team supervision and 24 hour management of active medical problems listed below. Physiatrist and rehab team continue to assess barriers to discharge/monitor patient progress toward functional and medical goals. FIM: FIM - Bathing Bathing Steps Patient Completed: Chest;Right Arm;Left Arm;Abdomen;Front perineal area Bathing: 3: Mod-Patient completes 5-7 44f 10 parts or 50-74%  FIM - Upper Body Dressing/Undressing Upper body dressing/undressing steps patient completed: Thread/unthread right sleeve of pullover shirt/dresss;Thread/unthread left sleeve of pullover shirt/dress;Put head through opening of pull over shirt/dress Upper body dressing/undressing: 4: Min-Patient completed 75 plus % of tasks  FIM - Lower Body Dressing/Undressing Lower body dressing/undressing steps patient  completed: Thread/unthread right underwear leg Lower body dressing/undressing: 1: Total-Patient completed less than 25% of tasks  FIM - Toileting Toileting: 0: Activity did not occur  FIM - Diplomatic Services operational officer Devices: Building control surveyor Transfers: 0-Activity did not occur  FIM - Banker Devices: Bed rails;HOB elevated Bed/Chair Transfer: 5: Supine > Sit: Supervision (verbal cues/safety issues);4: Bed > Chair or W/C: Min A (steadying Pt. > 75%)  FIM - Locomotion: Wheelchair Locomotion: Wheelchair: 1: Travels less than 50 ft with minimal assistance (Pt.>75%) FIM - Locomotion: Ambulation Locomotion: Ambulation Assistive Devices: Designer, industrial/product Ambulation/Gait Assistance: 4: Min guard Locomotion: Ambulation: 2: Travels 50 - 149 ft with minimal assistance (Pt.>75%)  Comprehension Comprehension Mode: Auditory Comprehension: 5-Understands complex 90% of the time/Cues < 10% of the time  Expression Expression Mode: Verbal Expression: 5-Expresses complex 90% of the time/cues < 10% of the time  Social Interaction Social Interaction: 6-Interacts appropriately with others with medication or extra time (anti-anxiety, antidepressant).  Problem Solving Problem Solving: 3-Solves basic 50 - 74% of the time/requires cueing 25 - 49% of the time  Memory Memory: 4-Recognizes or recalls 75 - 89% of the time/requires cueing 10 - 24% of the time  Medical Problem List and Plan:  1. DVT Prophylaxis/Anticoagulation: Pharmaceutical: Lovenox  2. Pain Management: Prn oxycodone effective.  3. Mood: Resume ativan--uses bid basis. Will discontinue valium. continue cymbalta. LSCW to follow for evaluation.  4. Neuropsych:   CT scan with age related atrophy, bloodwork fairly unremarkable  -limit neurosedating meds  -rx potential UTI 5. Diarrhea: Resolving. Will discontinue senna and add probiotic.  6. HTN: Will monitor with bid  checks. continue Norvasc and metoprolol.  7. BPH/Prostate cancer: Continue Avodart, Enablex--discontinue Flomax, and Tofranil as he does not use these anymore.   -UA+, culture pending---begin empiric cipro  -I/O cath prn. Pt requests that we contact urology---not sure what their role is at this point, but may pursue curbside consult ([facility]annenbaum?)  -OOB, standing to void 8. Hypothyroid: continue supplement.  9. Chronic cough: continue tessalon, mucinex--resume flonsae as well as PPI for chronic GERD which is likely a contributing factor.  LOS (Days) 4 A FACE TO FACE EVALUATION WAS PERFORMED  [name] T 05/25/2013 8:14 AM

## 2013-05-26 ENCOUNTER — Inpatient Hospital Stay (HOSPITAL_COMMUNITY): Payer: Medicare Other | Admitting: Occupational Therapy

## 2013-05-26 ENCOUNTER — Inpatient Hospital Stay (HOSPITAL_COMMUNITY): Payer: Medicare Other | Admitting: Physical Therapy

## 2013-05-26 ENCOUNTER — Inpatient Hospital Stay (HOSPITAL_COMMUNITY): Payer: Medicare Other

## 2013-05-26 DIAGNOSIS — Q762 Congenital spondylolisthesis: Secondary | ICD-10-CM

## 2013-05-26 DIAGNOSIS — IMO0002 Reserved for concepts with insufficient information to code with codable children: Secondary | ICD-10-CM

## 2013-05-26 DIAGNOSIS — M47817 Spondylosis without myelopathy or radiculopathy, lumbosacral region: Secondary | ICD-10-CM

## 2013-05-26 DIAGNOSIS — R339 Retention of urine, unspecified: Secondary | ICD-10-CM

## 2013-05-26 LAB — URINE CULTURE

## 2013-05-26 MED ORDER — FLEET ENEMA 7-19 GM/118ML RE ENEM
1.0000 | ENEMA | Freq: Every day | RECTAL | Status: DC | PRN
  Administered 2013-05-28: 1 via RECTAL
  Filled 2013-05-26: qty 1

## 2013-05-26 MED ORDER — SENNA 8.6 MG PO TABS
2.0000 | ORAL_TABLET | Freq: Every day | ORAL | Status: DC
  Administered 2013-05-26 – 2013-05-28 (×3): 17.2 mg via ORAL
  Filled 2013-05-26 (×4): qty 2

## 2013-05-26 NOTE — Progress Notes (Signed)
Physical Therapy Session Note  Patient Details  Name: [name] MRN: [id number] Date of Birth: 03-13-1936  Today's Date: 05/26/2013 Time: 1400-1500 Time Calculation (min): 60 min  Short Term Goals: Week 1:  PT Short Term Goal 1 (Week 1): Pt roll R/L with siderails via log rolling with S PT Short Term Goal 2 (Week 1): Pt will transfer supine to edge of bed with S.  PT Short Term Goal 3 (Week 1): Pt will transfer stand pivot with S.  PT Short Term Goal 4 (Week 1): Pt will ambulate with rolling walker 150 feet with S.  PT Short Term Goal 5 (Week 1): Pt will ascend/descend 2 4" steps with rolling walker and S.   Skilled Therapeutic Interventions/Progress Updates:    Session focused on functional transfers (steady A to close S), community gait outdoors with RW on uneven surfaces, discussion of continued exercise program (walking program) upon d/c, overall endurance, bed mobility, and use of AE to put on socks and shoes at the start of the therapy session. Pt functioned at overall steady A to close S level for transfers and gait; cues for placement of body inside RW especially during turns but improved from previous sessions.   Therapy Documentation Precautions:  Precautions Precautions: Back Precaution Comments: reviewed back precautions, pt verbalized 3/3 Required Braces or Orthoses: Spinal Brace Spinal Brace: Lumbar corset;Applied in sitting position Restrictions Weight Bearing Restrictions: No  Pain:  No complaints. Locomotion : Ambulation Ambulation/Gait Assistance: 5: Supervision  Other Treatments:    See FIM for current functional status  Therapy/Group: Individual Therapy  Karolee Stamps [facility] 05/26/2013, 4:09 PM

## 2013-05-26 NOTE — Progress Notes (Signed)
Social Work Patient ID: [name], male   DOB: 03-15-1936, 77 y.o.   MRN: [id number]  Met with pt and wife this morning to review team conference.  Both aware and agreeable with targeted d/c date of 10/18 with mod i to supervision goals.  Wife to provide 24/7 supervision.  Recommend HH f/u - pt requests [facility] as they have followed in past.  No DME needs per therapies and pt/ wife.  No concern.  Deaaron Fulghum, LCSW

## 2013-05-26 NOTE — Patient Care Conference (Signed)
Inpatient RehabilitationTeam Conference and Plan of Care Update Date: 05/25/2013   Time: 2:25 PM    Patient Name: [name]      Medical Record Number: [id number]  Date of Birth: 07-Mar-1936 Sex: Male         Room/Bed: 4W18C/4W18C-01 Payor Info: Payor: MEDICARE / Plan: MEDICARE PART A AND B / Product Type: *No Product type* /    Admitting Diagnosis: LUMBAR FUSION  Admit Date/Time:  05/21/2013  3:28 PM Admission Comments: No comment available   Primary Diagnosis:  HNP (herniated nucleus pulposus), lumbar Principal Problem: HNP (herniated nucleus pulposus), lumbar  Patient Active Problem List   Diagnosis Date Noted  . HNP (herniated nucleus pulposus), lumbar--with radiculopathy 05/21/2013  . Diarrhea 05/21/2013  . Altered mental status 12/02/2011  . Fever 12/02/2011  . Chronic cough 12/02/2011  . HTN (hypertension) 12/02/2011  . Hypothyroidism 12/02/2011    Expected Discharge Date: Expected Discharge Date: 05/29/13  Team Members Present: Physician leading conference: Dr. Faith Rogue Nurse Present: Ethelene Browns, RN PT Present: Karolee Stamps, PT OT Present: Mackie Pai, OT;Patricia Mat Carne, OT PPS Coordinator present : Edson Snowball, PT     Current Status/Progress Goal Weekly Team Focus  Medical   lumbar decompression for stenosis and radiculopathy, urine retention (prostate cancer)  stabliize medically to particiapte in therapies.   bladder mgt, foley cath placement, pain control   Bowel/Bladder   Pt requiring straight cath; urinary retention  Decrease urinary retention  Continue to monitor   Swallow/Nutrition/ Hydration             ADL's   min assist UB ADLs, total assist LB ADLs, min assist functional transfers and functional mobility using RW  overall supervision  ADL retraining, use of AE prn, functional transfer, functional mobility, overall activity tolerance/endurance   Mobility   min to mod A overall  supervision to mod I overall  sit to stands,  functional strengthening, adhering and recalling back precautions, activity tolerance/endurance, balance   Communication             Safety/Cognition/ Behavioral Observations            Pain   pt c/o back pain  pt will rate pain less than3 on scale of 0-10  continue to assess   Skin   Incision to back will heal  Contiue to assess  Incision will heal      *See Care Plan and progress notes for long and short-term goals.  Barriers to Discharge: bladder, pain control, safety    Possible Resolutions to Barriers:  foley, NMR, balance and gait training    Discharge Planning/Teaching Needs:  home with wife who can provide 24/7 light physical assistance      Team Discussion:  Making slow gains but expect to reach overall mod i goals but recommend 24/7 supervision due to slowed mentation at times.  Urology consult completed and foley replaced with OP f/u planned.    Revisions to Treatment Plan:  None   Continued Need for Acute Rehabilitation Level of Care: The patient requires daily medical management by a physician with specialized training in physical medicine and rehabilitation for the following conditions: Daily direction of a multidisciplinary physical rehabilitation program to ensure safe treatment while eliciting the highest outcome that is of practical value to the patient.: Yes Daily medical management of patient stability for increased activity during participation in an intensive rehabilitation regime.: Yes Daily analysis of laboratory values and/or radiology reports with any subsequent need for medication adjustment  of medical intervention for : Neurological problems;Post surgical problems (urology)  [name], [name] 05/26/2013, 1:58 PM

## 2013-05-26 NOTE — Progress Notes (Signed)
Occupational Therapy Session Notes  Patient Details  Name: [name] MRN: [id number] Date of Birth: 01/28/1936  Today's Date: 05/26/2013  Short Term Goals: Week 1:  OT Short Term Goal 1 (Week 1): Pt. will perform LB dressing with AE at minimal assist level OT Short Term Goal 2 (Week 1): Pt. will bathe LB with AE at minimal assist level OT Short Term Goal 3 (Week 1): Pt. will transfer to toilet with supervision level OT Short Term Goal 4 (Week 1): Pt. will recall back precautions with 1 questioning cue OT Short Term Goal 5 (Week 1): Pt. will don back brace with supervision  Skilled Therapeutic Interventions/Progress Updates:   Session #1 [phone] - 55 Minutes Individual Therapy Patient with complaints of pain "running up back of my leg"; RN made aware Patient found seated in w/c eating breakfast. Patient takes more than reasonable amount of time to complete this task. Patient with some confusion this am. Patient performed ADL retraining at sink side, seated for UB and standing for LB peri care. Lumbar corset donned prior to any stand or transitional movement. Patient able to stand with close supervision. Patient used AE prn and appropriately in order to complete LB dressing, increasing independence. Focused skilled intervention on sit<>stands, adhering to back precautions, overall activity tolerance/endurance, ADL retraining, and dynamic standing balance/tolerance/endurance. Patient takes much more than reasonable amount of time to complete bathing & dressing tasks as well. Patient did require min verbal cues in order to adhere to back precautions, even with use of reacher to assist with LB dressing. Patient completed grooming tasks of brushing teeth, washing hands, and washing face seated at sink with more than reasonable amount of time. At end of session, left patient seated in w/c beside bed with call bell & phone within reach.   Session #2 [id number] - 45 Minutes Individual  Therapy No complaints of pain Patient found seated in w/c. Patient propelled self from room -> tub room on , taking more than reasonable amount of time to propel self (15 minutes); focusing on UB strengthening during w/c mobility. Patient engaged in functional mobility/ambulation using rolling walker to ambulate into tub room for tub/shower transfer on/off tub transfer bench (with more than reasonable amount of time); supervision. Therapist discussed home set-up with shower and encouraged patient to have wife take pictures of bathrooms/showers. At end of session, left patient seated in w/c with call bell & phone within reach.   Precautions:  Precautions Precautions: Back Precaution Comments: reviewed back precautions, pt verbalized 3/3 Required Braces or Orthoses: Spinal Brace Spinal Brace: Lumbar corset;Applied in sitting position Restrictions Weight Bearing Restrictions: No  See FIM for current functional status  Caylin Raby 05/26/2013, 7:30 AM

## 2013-05-26 NOTE — Progress Notes (Signed)
Social Work Patient ID: [name], male   DOB: 11/02/1935, 77 y.o.   MRN: [id number]   Amada Jupiter, LCSW Social Worker Signed  Patient Care Conference Service date: 05/26/2013 1:58 PM  Inpatient RehabilitationTeam Conference and Plan of Care Update Date: 05/25/2013   Time: 2:25 PM     Patient Name: [name]       Medical Record Number: [id number]   Date of Birth: [birth year] Sex: Male         Room/Bed: 4W18C/4W18C-01 Payor Info: Payor: MEDICARE / Plan: MEDICARE PART A AND B / Product Type: *No Product type* /   Admitting Diagnosis: LUMBAR FUSION   Admit Date/Time:  05/21/2013  3:28 PM Admission Comments: No comment available   Primary Diagnosis:  HNP (herniated nucleus pulposus), lumbar Principal Problem: HNP (herniated nucleus pulposus), lumbar    Patient Active Problem List     Diagnosis  Date Noted   .  HNP (herniated nucleus pulposus), lumbar--with radiculopathy  05/21/2013   .  Diarrhea  05/21/2013   .  Altered mental status  12/02/2011   .  Fever  12/02/2011   .  Chronic cough  12/02/2011   .  HTN (hypertension)  12/02/2011   .  Hypothyroidism  12/02/2011     Expected Discharge Date: Expected Discharge Date: 05/29/13  Team Members Present: Physician leading conference: Dr. Faith Rogue Nurse Present: Ethelene Browns, RN PT Present: Karolee Stamps, PT OT Present: Mackie Pai, OT;Patricia Mat Carne, OT PPS Coordinator present : Edson Snowball, PT        Current Status/Progress  Goal  Weekly Team Focus   Medical     lumbar decompression for stenosis and radiculopathy, urine retention (prostate cancer)  stabliize medically to particiapte in therapies.   bladder mgt, foley cath placement, pain control   Bowel/Bladder     Pt requiring straight cath; urinary retention  Decrease urinary retention  Continue to monitor   Swallow/Nutrition/ Hydration            ADL's     min assist UB ADLs, total assist LB ADLs, min assist functional transfers and functional  mobility using RW  overall supervision  ADL retraining, use of AE prn, functional transfer, functional mobility, overall activity tolerance/endurance   Mobility     min to mod A overall  supervision to mod I overall  sit to stands, functional strengthening, adhering and recalling back precautions, activity tolerance/endurance, balance   Communication            Safety/Cognition/ Behavioral Observations           Pain     pt c/o back pain  pt will rate pain less than3 on scale of 0-10  continue to assess   Skin     Incision to back will heal  Contiue to assess  Incision will heal     *See Care Plan and progress notes for long and short-term goals.    Barriers to Discharge:  bladder, pain control, safety      Possible Resolutions to Barriers:    foley, NMR, balance and gait training      Discharge Planning/Teaching Needs:    home with wife who can provide 24/7 light physical assistance      Team Discussion:    Making slow gains but expect to reach overall mod i goals but recommend 24/7 supervision due to slowed mentation at times.  Urology consult completed and foley replaced with OP f/u planned.     Revisions  to Treatment Plan:    None    Continued Need for Acute Rehabilitation Level of Care: The patient requires daily medical management by a physician with specialized training in physical medicine and rehabilitation for the following conditions: Daily direction of a multidisciplinary physical rehabilitation program to ensure safe treatment while eliciting the highest outcome that is of practical value to the patient.: Yes Daily medical management of patient stability for increased activity during participation in an intensive rehabilitation regime.: Yes Daily analysis of laboratory values and/or radiology reports with any subsequent need for medication adjustment of medical intervention for : Neurological problems;Post surgical problems (urology)  Alonza Bogus, Reyan Helle 05/26/2013, 1:58  PM

## 2013-05-26 NOTE — Progress Notes (Signed)
Physical Therapy Note  Patient Details  Name: [name] MRN: [id number] Date of Birth: [birth year] Today's Date: 05/26/2013  1100-1130 (30 minutes) individual Pain: 6/10 lumbar to RT knee/ nurse notified / pain meds given Focus of treatment : wc mobility training/ activity tolerance; gait training Treatment: Pt up in wc upon arrival ; wc mobility 100 feet X 2 (level) SBA with increased time ; gait 80 feet X 1 RW min assist for safety with vcs to stay within AD during direction changes.    Shaida Route,JIM 05/26/2013, 11:18 AM

## 2013-05-26 NOTE — Progress Notes (Signed)
Subjective/Complaints: Concerned about urine retention. Pain under fair control. A 12 point review of systems has been performed and if not noted above is otherwise negative.   Objective: Vital Signs: Blood pressure 148/84, pulse 99, temperature 98.8 F (37.1 C), temperature source Oral, resp. rate 18, weight 96.163 kg (212 lb), SpO2 97.00%. Ct Head Wo Contrast  05/24/2013   CLINICAL DATA:  Cognitive decline over past years. Tremors.  EXAM: CT HEAD WITHOUT CONTRAST  TECHNIQUE: Contiguous axial images were obtained from the base of the skull through the vertex without intravenous contrast.  COMPARISON:  CT 12/02/2011  FINDINGS: Mild to moderate atrophy. Negative for hydrocephalus. Negative for acute infarct. No significant chronic ischemia. Negative for hemorrhage or mass lesion. Paranasal sinuses are clear. No skull lesion identified.  IMPRESSION: No acute abnormality. Atrophy is typical for age.   Electronically Signed   By: Marlan Palau M.D.   On: 05/24/2013 14:45    Recent Labs  05/24/13 0525  WBC 8.9  HGB 10.0*  HCT 29.8*  PLT 225    Recent Labs  05/24/13 0525  NA 134*  K 3.2*  CL 99  GLUCOSE 118*  BUN 18  CREATININE 0.75  CALCIUM 8.6   CBG (last 3)  No results found for this basename: GLUCAP,  in the last 72 hours  Wt Readings from Last 3 Encounters:  05/21/13 96.163 kg (212 lb)  05/10/13 110.224 kg (243 lb)  12/02/11 95.255 kg (210 lb)    Physical Exam:  Head: Normocephalic and atraumatic.  Eyes: Conjunctivae are normal. Pupils are equal, round, and reactive to light. No scleral icterus.  Neck: Normal range of motion. Neck supple. No JVD present. No tracheal deviation present. No thyromegaly present.  Cardiovascular: Normal rate and regular rhythm. Exam reveals no friction rub.  No murmur heard.  Respiratory: Effort normal and breath sounds normal. No respiratory distress. He has no rales.  Upper airway rhonchi due to oral secretions.  GI: Soft. Bowel sounds  are normal.  Musculoskeletal:  Back tender. Wearing LSO  Lymphadenopathy:  He has no cervical adenopathy.  Neurological: He is alert and oriented to person, place, and time. He displays normal reflexes. No cranial nerve deficit. Coordination abnormal.   dysphonic voice. Slow, monotonic speech pattern. Follows basic commands without difficulty. Appears to have delayed processing. Masked facies appearance, bradykinesic  Decreased proprioception left great toe, decreased light touch sensation left great toe. Intact sensation bilateral S1 Motor strength 5/5 in bilateral deltoid, bicep, tricep, grip 3 minus to 3/5 bilateral hip flexors knee extensors ankle dorsiflexors and plantar flexors with delayed processing and movement. No gross sensory abnormalities seen.    Skin: Skin is warm. He is not diaphoretic.  Back incision intact with staples and minimal drainage  Assessment/Plan: 1. Functional deficits secondary to lumbar stenosis with spondylolisthesis and myelopathy, DISH with prior fusion which require 3+ hours per day of interdisciplinary therapy in a comprehensive inpatient rehab setting. Physiatrist is providing close team supervision and 24 hour management of active medical problems listed below. Physiatrist and rehab team continue to assess barriers to discharge/monitor patient progress toward functional and medical goals. FIM: FIM - Bathing Bathing Steps Patient Completed: Chest;Right Arm;Left Arm;Abdomen;Front perineal area;Buttocks Bathing: 5: Supervision: Safety issues/verbal cues  FIM - Upper Body Dressing/Undressing Upper body dressing/undressing steps patient completed: Thread/unthread right sleeve of pullover shirt/dresss;Thread/unthread left sleeve of pullover shirt/dress;Put head through opening of pull over shirt/dress;Pull shirt over trunk Upper body dressing/undressing: 5: Supervision: Safety issues/verbal cues FIM - Lower Body  Dressing/Undressing Lower body  dressing/undressing steps patient completed: Thread/unthread right underwear leg;Thread/unthread left underwear leg;Pull underwear up/down;Thread/unthread right pants leg;Thread/unthread left pants leg;Pull pants up/down Lower body dressing/undressing: 5: Supervision: Safety issues/verbal cues (underwear and pants only)  FIM - Toileting Toileting: 0: Activity did not occur  FIM - Diplomatic Services operational officer Devices: Building control surveyor Transfers: 0-Activity did not occur  FIM - Banker Devices: Bed rails;HOB elevated Bed/Chair Transfer: 5: Supine > Sit: Supervision (verbal cues/safety issues);4: Bed > Chair or W/C: Min A (steadying Pt. > 75%)  FIM - Locomotion: Wheelchair Locomotion: Wheelchair: 0: Activity did not occur FIM - Locomotion: Ambulation Locomotion: Ambulation Assistive Devices: Designer, industrial/product Ambulation/Gait Assistance: 5: Supervision Locomotion: Ambulation: 5: Travels 150 ft or more with supervision/safety issues  Comprehension Comprehension Mode: Auditory Comprehension: 5-Understands complex 90% of the time/Cues < 10% of the time  Expression Expression Mode: Verbal Expression: 5-Expresses basic 90% of the time/requires cueing < 10% of the time.  Social Interaction Social Interaction: 6-Interacts appropriately with others with medication or extra time (anti-anxiety, antidepressant).  Problem Solving Problem Solving: 4-Solves basic 75 - 89% of the time/requires cueing 10 - 24% of the time  Memory Memory: 4-Recognizes or recalls 75 - 89% of the time/requires cueing 10 - 24% of the time  Medical Problem List and Plan:  1. DVT Prophylaxis/Anticoagulation: Pharmaceutical: Lovenox  2. Pain Management: Prn oxycodone effective.  3. Mood: Resume ativan--uses bid basis. Will discontinue valium. continue cymbalta. LSCW to follow for evaluation.  4. Neuropsych:   CT scan with age related atrophy, bloodwork  fairly unremarkable  -limit neurosedating meds  -rx potential UTI 5. Diarrhea: Resolving. Will discontinue senna and add probiotic.  6. HTN: Will monitor with bid checks. continue Norvasc and metoprolol.  7. BPH/Prostate cancer: Continue Avodart, Enablex--discontinued,  Flomax restarted   -appreciate uro assistance  -ucx + for enterobacter, sens to cipro. Continue for 7 day course  -foley placed by urology, 5-7 days. Consider voiding trial here vs outpt 8. Hypothyroid: continue supplement.  9. Chronic cough: continue tessalon, mucinex--resume flonsae as well as PPI for chronic GERD which is likely a contributing factor.  LOS (Days) 5 A FACE TO FACE EVALUATION WAS PERFORMED  [name] T 05/26/2013 9:37 AM

## 2013-05-27 ENCOUNTER — Inpatient Hospital Stay (HOSPITAL_COMMUNITY): Payer: Medicare Other

## 2013-05-27 ENCOUNTER — Inpatient Hospital Stay (HOSPITAL_COMMUNITY): Payer: Medicare Other | Admitting: Occupational Therapy

## 2013-05-27 LAB — BASIC METABOLIC PANEL
CO2: 27 mEq/L (ref 19–32)
Calcium: 8.9 mg/dL (ref 8.4–10.5)
Glucose, Bld: 114 mg/dL — ABNORMAL HIGH (ref 70–99)
Potassium: 3.8 mEq/L (ref 3.5–5.1)
Sodium: 138 mEq/L (ref 135–145)

## 2013-05-27 LAB — CBC
HCT: 29.2 % — ABNORMAL LOW (ref 39.0–52.0)
Hemoglobin: 9.6 g/dL — ABNORMAL LOW (ref 13.0–17.0)
MCH: 29.5 pg (ref 26.0–34.0)
MCV: 89.8 fL (ref 78.0–100.0)
RBC: 3.25 MIL/uL — ABNORMAL LOW (ref 4.22–5.81)
WBC: 6.6 10*3/uL (ref 4.0–10.5)

## 2013-05-27 MED ORDER — LORAZEPAM 0.5 MG PO TABS
0.5000 mg | ORAL_TABLET | Freq: Two times a day (BID) | ORAL | Status: DC | PRN
  Administered 2013-05-27 – 2013-05-28 (×2): 0.5 mg via ORAL
  Filled 2013-05-27 (×2): qty 1

## 2013-05-27 NOTE — Progress Notes (Signed)
Subjective/Complaints: Slept well. Does complain of some shooting pain into the right thigh when he transfers or bends over.  A 12 point review of systems has been performed and if not noted above is otherwise negative.   Objective: Vital Signs: Blood pressure 143/85, pulse 68, temperature 98.3 F (36.8 C), temperature source Oral, resp. rate 17, weight 106.096 kg (233 lb 14.4 oz), SpO2 96.00%. No results found.  Recent Labs  05/27/13 0430  WBC 6.6  HGB 9.6*  HCT 29.2*  PLT 256    Recent Labs  05/27/13 0430  NA 138  K 3.8  CL 103  GLUCOSE 114*  BUN 17  CREATININE 0.71  CALCIUM 8.9   CBG (last 3)  No results found for this basename: GLUCAP,  in the last 72 hours  Wt Readings from Last 3 Encounters:  05/26/13 106.096 kg (233 lb 14.4 oz)  05/10/13 110.224 kg (243 lb)  12/02/11 95.255 kg (210 lb)    Physical Exam:  Head: Normocephalic and atraumatic.  Eyes: Conjunctivae are normal. Pupils are equal, round, and reactive to light. No scleral icterus.  Neck: Normal range of motion. Neck supple. No JVD present. No tracheal deviation present. No thyromegaly present.  Cardiovascular: Normal rate and regular rhythm. Exam reveals no friction rub.  No murmur heard.  Respiratory: Effort normal and breath sounds normal. No respiratory distress. He has no rales.  Upper airway rhonchi due to oral secretions.  GI: Soft. Bowel sounds are normal.  Musculoskeletal:  Back tender. Wearing LSO  Lymphadenopathy:  He has no cervical adenopathy.  Neurological: He is alert and oriented to person, place, and time. He displays normal reflexes. No cranial nerve deficit. Coordination abnormal.     Slow, monotonic speech pattern. Follows basic commands without difficulty. Appears to have delayed processing. Masked facies appearance, bradykinesic  Decreased proprioception left great toe, decreased light touch sensation left great toe. Intact sensation bilateral S1 Motor strength 5/5 in  bilateral deltoid, bicep, tricep, grip 3 minus to 3/5 bilateral hip flexors knee extensors ankle dorsiflexors and plantar flexors with delayed processing and movement. No gross sensory abnormalities seen.    Skin: Skin is warm. He is not diaphoretic.  Back incision intact with staples and minimal drainage  Assessment/Plan: 1. Functional deficits secondary to lumbar stenosis with spondylolisthesis and myelopathy, DISH with prior fusion which require 3+ hours per day of interdisciplinary therapy in a comprehensive inpatient rehab setting. Physiatrist is providing close team supervision and 24 hour management of active medical problems listed below. Physiatrist and rehab team continue to assess barriers to discharge/monitor patient progress toward functional and medical goals. FIM: FIM - Bathing Bathing Steps Patient Completed: Chest;Right Arm;Left Arm;Abdomen;Front perineal area;Buttocks Bathing: 5: Supervision: Safety issues/verbal cues  FIM - Upper Body Dressing/Undressing Upper body dressing/undressing steps patient completed: Thread/unthread right sleeve of pullover shirt/dresss;Thread/unthread left sleeve of pullover shirt/dress;Put head through opening of pull over shirt/dress;Pull shirt over trunk Upper body dressing/undressing: 5: Supervision: Safety issues/verbal cues FIM - Lower Body Dressing/Undressing Lower body dressing/undressing steps patient completed: Thread/unthread right underwear leg;Thread/unthread left underwear leg;Pull underwear up/down;Thread/unthread right pants leg;Thread/unthread left pants leg;Pull pants up/down Lower body dressing/undressing: 5: Supervision: Safety issues/verbal cues (underwear and pants only)  FIM - Toileting Toileting: 0: Activity did not occur  FIM - Diplomatic Services operational officer Devices: Building control surveyor Transfers: 0-Activity did not occur  FIM - Banker Devices:  Walker;Orthosis Bed/Chair Transfer: 4: Sit > Supine: Min A (steadying pt. > 75%/lift 1 leg);5:  Chair or W/C > Bed: Supervision (verbal cues/safety issues)  FIM - Locomotion: Wheelchair Locomotion: Wheelchair: 0: Activity did not occur FIM - Locomotion: Ambulation Locomotion: Ambulation Assistive Devices: Designer, industrial/product Ambulation/Gait Assistance: 5: Supervision Locomotion: Ambulation: 5: Travels 150 ft or more with supervision/safety issues  Comprehension Comprehension Mode: Auditory Comprehension: 5-Understands complex 90% of the time/Cues < 10% of the time  Expression Expression Mode: Verbal Expression: 5-Expresses basic 90% of the time/requires cueing < 10% of the time.  Social Interaction Social Interaction: 6-Interacts appropriately with others with medication or extra time (anti-anxiety, antidepressant).  Problem Solving Problem Solving: 4-Solves basic 75 - 89% of the time/requires cueing 10 - 24% of the time  Memory Memory: 4-Recognizes or recalls 75 - 89% of the time/requires cueing 10 - 24% of the time  Medical Problem List and Plan:  1. DVT Prophylaxis/Anticoagulation: Pharmaceutical: Lovenox  2. Pain Management: Prn oxycodone effective.  3. Mood: Resume ativan--uses bid basis. Will discontinue valium. continue cymbalta. LSCW to follow for evaluation.  4. Neuropsych:   CT scan with age related atrophy, bloodwork fairly unremarkable  -limit neurosedating meds  -rx potential UTI 5. Diarrhea: Resolving. Will discontinue senna and add probiotic.  6. HTN: Will monitor with bid checks. continue Norvasc and metoprolol.  7. BPH/Prostate cancer: Continue Avodart, Enablex--discontinued,  Flomax restarted   -appreciate uro assistance  -enterobacter UTI---7 days cipro  -foley placed by urology, 5-7 days. Consider voiding trial here vs outpt 8. Hypothyroid: continue supplement.  9. Chronic cough: continue tessalon, mucinex--resume flonase as well as PPI for chronic GERD which  is likely a contributing factor.  LOS (Days) 6 A FACE TO FACE EVALUATION WAS PERFORMED  SWARTZ,ZACHARY T 05/27/2013 8:15 AM

## 2013-05-27 NOTE — Progress Notes (Signed)
Physical Therapy Session Note  Patient Details  Name: [name] MRN: [id number] Date of Birth: 1935-11-06  Today's Date: 05/27/2013  Short Term Goals: Week 1:  PT Short Term Goal 1 (Week 1): Pt roll R/L with siderails via log rolling with S PT Short Term Goal 2 (Week 1): Pt will transfer supine to edge of bed with S.  PT Short Term Goal 3 (Week 1): Pt will transfer stand pivot with S.  PT Short Term Goal 4 (Week 1): Pt will ambulate with rolling walker 150 feet with S.  PT Short Term Goal 5 (Week 1): Pt will ascend/descend 2 4" steps with rolling walker and S.   Session #1 Time: 1300-1400 Time Calculation (min): 60 min Individual therapy; Denies pain. Session focused on functional gait and endurance training, problem solving home environment set-up for bed mobility and bed transfers (pt's bed at home is very tall and his guest bed is very low), and introduced [place] HEP (10 reps each exercise BLE). Discussed options for bed transfer at home including using guest bed or using a step up backwards into the bed (pt crawled into bed before facing forward). Pt mod I with basic transfers, using extra time and made mod I in his room.  Session #2: Time: 1515-1600 Time Calculation (min): 45 min Individual therapy; Denies pain. Session focused on bed mobility for OOB, gait with RW on unit S/mod I for endurance/strengthening, finished second set of HEP initiated in earlier session (pt able to recall all exercises independently), curb step negotiation with RW with close S/steady A (cues to step closer as to not have to bend as much to place RW), and Nustep for endurance and strengthening x 10 min on level 5. Overall excellent participation in session.   Therapy Documentation Precautions:  Precautions Precautions: Back Precaution Comments: reviewed back precautions, pt verbalized 3/3 Required Braces or Orthoses: Spinal Brace Spinal Brace: Lumbar corset;Applied in sitting  position Restrictions Weight Bearing Restrictions: No   See FIM for current functional status  Therapy/Group: Individual Therapy  Karolee Stamps [facility] 05/27/2013, 11:54 AM

## 2013-05-27 NOTE — Progress Notes (Signed)
Occupational Therapy Session Notes  Patient Details  Name: [name] MRN: [id number] Date of Birth: [birth year]  Today's Date: 05/27/2013  Short Term Goals: Week 1:  OT Short Term Goal 1 (Week 1): Pt. will perform LB dressing with AE at minimal assist level OT Short Term Goal 2 (Week 1): Pt. will bathe LB with AE at minimal assist level OT Short Term Goal 3 (Week 1): Pt. will transfer to toilet with supervision level OT Short Term Goal 4 (Week 1): Pt. will recall back precautions with 1 questioning cue OT Short Term Goal 5 (Week 1): Pt. will don back brace with supervision  Skilled Therapeutic Interventions/Progress Updates:   Session #1 0900-1000 - 60 Minutes Individual Therapy No complaints of pain Patient found seated in w/c upon entering room. Patient performed ADL retraining at sink level in sit<>stand position; only standing with lumbar corset donned. Patient took much more than reasonable amount of time to complete these tasks. Patient also performed UB/LB dressing at sink in sit<>stand position with more than reasonable amount of time and using AE prn. Patient did not use sock aid to donn socks, instead patient was able to bring bilateral feet to self (crossing legs) in order to donn socks without breaking back precautions. Reacher did come in handy for patient, increasing independence. Patient stood at sink to complete grooming tasks independently. After ADL, patient ambulated to bathroom for toilet transfer on/off elevated toilet seat using grab bars and RW; patient mod I for this task. Patient ambulated back to w/c after toilet transfer and therapist left patient seated beside bed with call bell & phone within reach. Dicussed bathroom set-up with patient, wife sent pictures; hope to talk with wife later this am.   Session #2 1100-1130 - 30 Minutes Individual Therapy No complaints of pain Patient found seated in w/c with wife present. Focused skilled intervention on family  education to wife and patient regarding shower stall transfers. Recommend patient use walk-in shower with shower seat, ambulating in backwards while using rolling walker. Wife is independent to assist patient with this task. After simulated shower stall transfer using blue block, patient ambulated back to room using rolling walker with close supervision. Patient left seated in w/c beside bed with call bell & phone within reach.   Precautions:  Precautions Precautions: Back Precaution Comments: reviewed back precautions, pt verbalized 3/3 Required Braces or Orthoses: Spinal Brace Spinal Brace: Lumbar corset;Applied in sitting position Restrictions Weight Bearing Restrictions: No  See FIM for current functional status  [name] 05/27/2013, 7:29 AM

## 2013-05-28 ENCOUNTER — Inpatient Hospital Stay (HOSPITAL_COMMUNITY): Payer: Medicare Other | Admitting: *Deleted

## 2013-05-28 ENCOUNTER — Inpatient Hospital Stay (HOSPITAL_COMMUNITY): Payer: Medicare Other | Admitting: Physical Therapy

## 2013-05-28 MED ORDER — CIPROFLOXACIN HCL 500 MG PO TABS: 500.0000 mg | ORAL_TABLET | Freq: Two times a day (BID) | ORAL | Status: AC

## 2013-05-28 MED ORDER — LORAZEPAM 1 MG PO TABS: 0.5000 mg | ORAL_TABLET | Freq: Two times a day (BID) | ORAL | Status: AC | PRN

## 2013-05-28 MED ORDER — OXYCODONE HCL 10 MG PO TABS: 5.0000 mg | ORAL_TABLET | Freq: Four times a day (QID) | ORAL | Status: DC | PRN

## 2013-05-28 MED ORDER — ACETAMINOPHEN 325 MG PO TABS: 325.0000 mg | ORAL_TABLET | ORAL | Status: AC | PRN

## 2013-05-28 MED ORDER — TAMSULOSIN HCL 0.4 MG PO CAPS: 0.4000 mg | ORAL_CAPSULE | Freq: Every day | ORAL | Status: AC

## 2013-05-28 MED ORDER — FLUTICASONE PROPIONATE 50 MCG/ACT NA SUSP: 1.0000 | Freq: Every day | NASAL | Status: AC | PRN

## 2013-05-28 NOTE — Progress Notes (Signed)
Occupational Therapy Note    Patient Details  Name: [name] MRN: [id number] Date of Birth: 1935-12-11 Today's Date: 05/28/2013  Time:0800-0900  (60 min)  1st. session Pain:  6/10  Back pain. Nursing aware   Individual session  1st session:   Pt. Ambulated with RW to retrieve clothes.  Toilet transfer= Mod I.  Perforemed sit to stand with Mod from toilet, chair and tub bench.  Used AE during dressing/  Mod I with toilet transfers.  Needed 1 questioning cue when he was going to wash feet, and he was independent with problem solving.  Adhered to back precautions.    Time: 1100-1200  (60)  2nd session Pain:  6/10  Nursing aware Individual session    2nd session:  Engaged in functional mobility with RW in ADL apartment.  Performed step over into shower stall with supervision and minimal assist for tecnique.  Pt.ambulated to bed and was supervision from sit to stand.  Padded a place on the catheter that was irritating patient's leg.  Ambulated with RW from apartment to pt's room with [name] 05/28/2013, 8:19 AM

## 2013-05-28 NOTE — Plan of Care (Signed)
Problem: SCI BOWEL ELIMINATION Goal: RH STG MANAGE BOWEL WITH ASSISTANCE STG Manage Bowel with mod Assistance.  Outcome: Not Progressing Requires total assist due to fleets enema  Goal: RH STG SCI MANAGE BOWEL WITH MEDICATION WITH ASSISTANCE STG SCI Manage bowel with medication with min assistance.  Outcome: Not Progressing Requires total assist with fleets enema  Problem: RH PAIN MANAGEMENT Goal: RH STG PAIN MANAGED AT OR BELOW PT'S PAIN GOAL 3 or less out of 10  Outcome: Not Progressing 4 or less at this time

## 2013-05-28 NOTE — Progress Notes (Signed)
Occupational Therapy Discharge Summary  Patient Details  Name: [name] MRN: [id number] Date of Birth: [birth year]  Today's Date: 05/28/2013 Time:  -   0800-0900  60 min     Patient has met 8  of 8 long term goals due to improved activity tolerance and improved balance.  Patient to discharge at overall Modified Independent level.  Patient's care partner is independent to provide the necessary physical assistance at discharge.    Reasons goals not met:All goals met  Recommendation:  Patient will benefit from ongoing skilled OT services in home health setting to continue to advance functional skills in the area of iADL.  Equipment: No equipment provided  Reasons for discharge: treatment goals met  Patient/family agrees with progress made and goals achieved: Yes  OT Discharge Precautions/Restrictions  Back precautions Precautions Required Braces or Orthoses: Spinal Brace Spinal Brace: Applied in sitting position Restrictions Weight Bearing Restrictions: No General:  Pt needs minimal cues when in shower to adhere to back precautions.  His wife will be available prn.     Pain:   Pain Assessment Pain Score: 7  Pain Type: Surgical pain Pain Location: Leg Pain Orientation: Right;Proximal Pain Descriptors / Indicators: Sharp;Shooting Pain Onset: With Activity Patients Stated Pain Goal: 4 Pain Intervention(s): RN made aware Multiple Pain Sites: No ADL   Vision/Perception  Vision - History Baseline Vision: Wears glasses all the time Visual History: Cataracts Vision - Assessment Eye Alignment: Within Functional Limits Vision Assessment: Vision not tested Perception Perception: Within Functional Limits  Cognition Overall Cognitive Status: History of cognitive impairments - at baseline Arousal/Alertness: Awake/alert Orientation Level: Oriented X4 Attention: Selective Sustained Attention: Appears intact Selective Attention: Appears intact Memory: Appears  intact Awareness: Appears intact Problem Solving Impairment: Verbal basic;Functional basic Safety/Judgment: Appears intact Sensation Sensation Light Touch: Impaired by gross assessment Coordination Gross Motor Movements are Fluid and Coordinated: Yes Fine Motor Movements are Fluid and Coordinated: Yes Motor  Motor Motor: Within Functional Limits Mobility  Bed Mobility Bed Mobility: Supine to Sit;Sitting - Scoot to [place] of Bed Rolling Right: 6: Modified independent (Device/Increase time) Rolling Left: 7: Independent Left Sidelying to Sit: 7: Independent Supine to Sit: 7: Independent Sitting - Scoot to Edge of Bed: 7: Independent Sit to Sidelying Left: 7: Independent Transfers Transfers: Sit to Stand Sit to Stand: 6: Modified independent (Device/Increase time) Stand to Sit: 6: Modified independent (Device/Increase time)  Trunk/Postural Assessment  Cervical Assessment Cervical Assessment: Within Functional Limits Thoracic Assessment Thoracic Assessment: Within Functional Limits Lumbar Assessment Lumbar Assessment: Exceptions to [facility] Postural Control Postural Control: Deficits on evaluation  Balance   Extremity/Trunk Assessment RUE Assessment RUE Assessment: Exceptions to [facility] RUE AROM (degrees) Right Shoulder Flexion: 120 Degrees LUE Assessment LUE Assessment: Exceptions to WFL LUE AROM (degrees) LUE Overall AROM Comments: 130 sho. llexion  See FIM for current functional status  Wadie, Liew 05/28/2013, 11:43 AM

## 2013-05-28 NOTE — Progress Notes (Addendum)
Subjective/Complaints: No complaints. Getting up with tech already this am. Alert, slept well.   A 12 point review of systems has been performed and if not noted above is otherwise negative.   Objective: Vital Signs: Blood pressure 135/69, pulse 62, temperature 97.7 F (36.5 C), temperature source Oral, resp. rate 18, weight 106.096 kg (233 lb 14.4 oz), SpO2 94.00%. No results found.  Recent Labs  05/27/13 0430  WBC 6.6  HGB 9.6*  HCT 29.2*  PLT 256    Recent Labs  05/27/13 0430 05/28/13 0635  NA 138  --   K 3.8  --   CL 103  --   GLUCOSE 114*  --   BUN 17  --   CREATININE 0.71 0.76  CALCIUM 8.9  --    CBG (last 3)  No results found for this basename: GLUCAP,  in the last 72 hours  Wt Readings from Last 3 Encounters:  05/26/13 106.096 kg (233 lb 14.4 oz)  05/10/13 110.224 kg (243 lb)  12/02/11 95.255 kg (210 lb)    Physical Exam:  Head: Normocephalic and atraumatic.  Eyes: Conjunctivae are normal. Pupils are equal, round, and reactive to light. No scleral icterus.  Neck: Normal range of motion. Neck supple. No JVD present. No tracheal deviation present. No thyromegaly present.  Cardiovascular: Normal rate and regular rhythm. Exam reveals no friction rub.  No murmur heard.  Respiratory: Effort normal and breath sounds normal. No respiratory distress. He has no rales.  Upper airway rhonchi due to oral secretions.  GI: Soft. Bowel sounds are normal.  Musculoskeletal:  Back tender. Wearing LSO  Lymphadenopathy:  He has no cervical adenopathy.  Neurological: He is alert and oriented to person, place, and time. He displays normal reflexes. No cranial nerve deficit. Coordination abnormal.     Slow, improved speech pattern. Follows basic commands without difficulty. Appears to have delayed processing. Good sitting balance  Decreased proprioception left great toe, decreased light touch sensation left great toe. Intact sensation bilateral S1 Motor strength 5/5 in  bilateral deltoid, bicep, tricep, grip 3 minus to 3/5 bilateral hip flexors knee extensors ankle dorsiflexors and plantar flexors with delayed processing and movement. No gross sensory abnormalities seen.    Skin: Skin is warm. He is not diaphoretic.  Back incision intact with staple- no drainage. Well approximated  Assessment/Plan: 1. Functional deficits secondary to lumbar stenosis with spondylolisthesis and myelopathy, DISH with prior fusion which require 3+ hours per day of interdisciplinary therapy in a comprehensive inpatient rehab setting. Physiatrist is providing close team supervision and 24 hour management of active medical problems listed below. Physiatrist and rehab team continue to assess barriers to discharge/monitor patient progress toward functional and medical goals.  Dc home tomorrow   FIM: FIM - Bathing Bathing Steps Patient Completed: Chest;Right Arm;Left Arm;Abdomen Bathing: 5: Supervision: Safety issues/verbal cues  FIM - Upper Body Dressing/Undressing Upper body dressing/undressing steps patient completed: Thread/unthread right sleeve of pullover shirt/dresss;Thread/unthread left sleeve of pullover shirt/dress;Put head through opening of pull over shirt/dress;Pull shirt over trunk Upper body dressing/undressing: 5: Supervision: Safety issues/verbal cues FIM - Lower Body Dressing/Undressing Lower body dressing/undressing steps patient completed: Thread/unthread right underwear leg;Thread/unthread left underwear leg;Pull underwear up/down;Thread/unthread right pants leg;Thread/unthread left pants leg;Pull pants up/down Lower body dressing/undressing: 5: Supervision: Safety issues/verbal cues  FIM - Toileting Toileting: 0: Activity did not occur  FIM - Diplomatic Services operational officer Devices: Elevated toilet seat;Walker;Grab bars Toilet Transfers: 6-Assistive device: No helper  FIM - Press photographer  Assistive Devices:  Therapist, occupational: 5: Supine > Sit: Supervision (verbal cues/safety issues);4: Sit > Supine: Min A (steadying pt. > 75%/lift 1 leg);6: Chair or W/C > Bed: No assist;6: Bed > Chair or W/C: No assist  FIM - Locomotion: Wheelchair Locomotion: Wheelchair: 1: Total Assistance/staff pushes wheelchair (Pt<25%) FIM - Locomotion: Ambulation Locomotion: Ambulation Assistive Devices: Walker - Rolling;Orthosis Ambulation/Gait Assistance: 5: Supervision Locomotion: Ambulation: 5: Travels 150 ft or more with supervision/safety issues  Comprehension Comprehension Mode: Auditory Comprehension: 6-Follows complex conversation/direction: With extra time/assistive device  Expression Expression Mode: Verbal Expression: 6-Expresses complex ideas: With extra time/assistive device  Social Interaction Social Interaction: 6-Interacts appropriately with others with medication or extra time (anti-anxiety, antidepressant).  Problem Solving Problem Solving: 5-Solves complex 90% of the time/cues < 10% of the time  Memory Memory: 5-Recognizes or recalls 90% of the time/requires cueing < 10% of the time  Medical Problem List and Plan:  1. DVT Prophylaxis/Anticoagulation: Pharmaceutical: Lovenox  2. Pain Management: Prn oxycodone effective.  3. Mood: Resume ativan--uses bid basis. Will discontinue valium. continue cymbalta. LSCW to follow for evaluation.  4. Neuropsych:   CT scan with age related atrophy, bloodwork fairly unremarkable  -limit neurosedating meds    5. Diarrhea: Resolved. probiotic 6. HTN: Will monitor with bid checks. continue Norvasc and metoprolol.  7. BPH/Prostate cancer: Continue Avodart, Enablex--discontinued,  Flomax restarted   -appreciate uro assistance  -enterobacter UTI---7 days cipro  -foley placed by urology, has appt on Tuesday it appears. Can be removed after eval then 8. Hypothyroid: continue supplement.  9. Chronic cough: continue tessalon, mucinex--resume flonase as  well as PPI for chronic GERD which is likely a contributing factor.  LOS (Days) 7 A FACE TO FACE EVALUATION WAS PERFORMED  [name] 05/28/2013 8:15 AM

## 2013-05-28 NOTE — Discharge Summary (Signed)
Physician Discharge Summary  Patient ID: [name] MRN: [id number] DOB/AGE: [birth year] 75 y.o.  Admit date: 05/21/2013 Discharge date: 05/29/13  Discharge Diagnoses:  Principal Problem:   HNP (herniated nucleus pulposus), lumbar--with radiculopathy Active Problems:   Chronic cough   HTN (hypertension)   Hypothyroidism   Diarrhea   Discharged Condition: Stable.   Significant Diagnostic Studies:  Ct Head Wo Contrast  05/24/2013   CLINICAL DATA:  Cognitive decline over past years. Tremors.  EXAM: CT HEAD WITHOUT CONTRAST  TECHNIQUE: Contiguous axial images were obtained from the base of the skull through the vertex without intravenous contrast.  COMPARISON:  CT 12/02/2011  FINDINGS: Mild to moderate atrophy. Negative for hydrocephalus. Negative for acute infarct. No significant chronic ischemia. Negative for hemorrhage or mass lesion. Paranasal sinuses are clear. No skull lesion identified.  IMPRESSION: No acute abnormality. Atrophy is typical for age.   Electronically Signed   By: Marlan Palau M.D.   On: 05/24/2013 14:45    BMET    Component Value Date/Time   NA 138 05/27/2013 0430   K 3.8 05/27/2013 0430   CL 103 05/27/2013 0430   CO2 27 05/27/2013 0430   GLUCOSE 114* 05/27/2013 0430   BUN 17 05/27/2013 0430   CREATININE 0.76 05/28/2013 0635   CALCIUM 8.9 05/27/2013 0430   GFRNONAA 88* 05/28/2013 0635   GFRAA >90 05/28/2013 0635    CBC:  Recent Labs Lab 05/27/13 0430  WBC 6.6  HGB 9.6*  HCT 29.2*  MCV 89.8  PLT 256      Brief HPI:   [name] is a 77 y.o. male with history of HTN, prostate cancer, diffuse idiopathic skeletal hyperostosis s/p L3/4 fusion who has had LBP radiating to BLE and limiting ambulation. Work up with L4/5 and L5/S1 stenosis with HNP and patient elected to undergo removal of previous L3-L4 screws with bilateral lam L4/5 and L5-S1 diskectomy with screws L4-S1. Post op has had fevers as well as diarrhea with incontinence.  Therapies initiated and CIR was recommended by team.   Hospital Course: PERFECTO PURDY was admitted to rehab 05/21/2013 for inpatient therapies to consist of PT,  and OT at least three hours five days a week. Past admission physiatrist, therapy team and rehab RN have worked together to provide customized collaborative inpatient rehab. Back incision has healed well without signs or symptoms of infection. Diarrhea has resolved and c diff check was negative. He was noted to have delayed processing and his wife expressed concerns of decline in cognitive status in the past few months.  CT head done showed no acute changes. Vitamin B 12 levels and TSH were WNL. His chronic oxycodone and ativan doses were decreased with improvement in mentation.   He was noted to have problems voiding and required in and out caths. Urine culture revealed enterobacter UTI and he was started on cipro for treatment. Urology was consulted for input and recommended bladder rest with voiding trial in a week. Patient has been set for follow up appointment on 10/23. He developed constipation and was started on bowel program to help with symptoms. Po intake has been good. He has made good progress and is at modified independent to supervision level.  Family education was done with wife who will provide assistance needed. He will continue to receive Home health therapies past discharge.    Rehab course: During patient's stay in rehab weekly team conferences were held to monitor patient's progress, set goals and discuss barriers to discharge.  Patient has had improvement in activity tolerance, balance, postural control, as well as ability to compensate for deficits. He is modified independent for transfers with increased time. He is able to ambulate >150 feet X 2 with slow gait and cues for posture. Supervision is recommended due to safety with RW as well as impaired posture. He is modified independent for ADL tasks and needs minimal cues in  shower to maintain back precautions.    Disposition: Home.   Diet: Regular.   Special Instructions: 1.  Routine back precautions. Wear brace when out of bed.  2. No driving.         Future Appointments Provider Department Dept Phone   06/22/2013 11:40 AM Ranelle Oyster, MD [place] [phone]       Medication List    STOP taking these medications       diazepam 5 MG tablet  Commonly known as:  VALIUM     ibuprofen 800 MG tablet  Commonly known as:  ADVIL,MOTRIN     imipramine 25 MG tablet  Commonly known as:  TOFRANIL     solifenacin 10 MG tablet  Commonly known as:  VESICARE      TAKE these medications       acetaminophen 325 MG tablet  Commonly known as:  TYLENOL  Take 1-2 tablets (325-650 mg total) by mouth every 4 (four) hours as needed.     amLODipine 5 MG tablet  Commonly known as:  NORVASC  Take 10 mg by mouth daily. Hold if SBP <100     benzonatate 100 MG capsule  Commonly known as:  TESSALON  Take 100 mg by mouth 2 (two) times daily.     ciprofloxacin 500 MG tablet  Commonly known as:  CIPRO  Take 1 tablet (500 mg total) by mouth 2 (two) times daily.     DULoxetine 60 MG capsule  Commonly known as:  CYMBALTA  Take 60 mg by mouth daily.     dutasteride 0.5 MG capsule  Commonly known as:  AVODART  Take 0.5 mg by mouth daily.     fluticasone 50 MCG/ACT nasal spray  Commonly known as:  FLONASE  Place 1 spray into the nose daily as needed for rhinitis.     guaiFENesin 600 MG 12 hr tablet  Commonly known as:  MUCINEX  Take 600 mg by mouth 2 (two) times daily.     levothyroxine 75 MCG tablet  Commonly known as:  SYNTHROID, LEVOTHROID  Take 75 mcg by mouth See admin instructions. Take on Saturdays and Sundays only.     levothyroxine 50 MCG tablet  Commonly known as:  SYNTHROID, LEVOTHROID  Take 50 mcg by mouth See admin instructions. Take on Mondays, Tuesdays, Wednesdays, Thursdays, and Fridays.      LORazepam 1 MG tablet  Commonly known as:  ATIVAN  Take 0.5 tablets (0.5 mg total) by mouth 2 (two) times daily as needed for anxiety.     metoprolol tartrate 25 MG tablet  Commonly known as:  LOPRESSOR  Take 25 mg by mouth 2 (two) times daily.     omeprazole 20 MG capsule  Commonly known as:  PRILOSEC  Take 20 mg by mouth 2 (two) times daily.     Oxycodone HCl 10 MG Tabs  Take 0.5-1 tablets (5-10 mg total) by mouth every 6 (six) hours as needed. For pain.     senna 8.6 MG Tabs tablet  Commonly known as:  [employer]  Take 2 tablets by mouth at bedtime.     tamsulosin 0.4 MG Caps capsule  Commonly known as:  FLOMAX  Take 1 capsule (0.4 mg total) by mouth daily.     Vitamin D (Ergocalciferol) 50000 UNITS Caps capsule  Commonly known as:  DRISDOL  Take 50,000 Units by mouth every 7 (seven) days. Patient tries to take on Mondays or Tuesdays.       Follow-up Information   Follow up with Ranelle Oyster, MD On 06/22/2013. (Be there at 11:20 am  for 11:40  appointment)    Specialty:  Physical Medicine and Rehabilitation   Contact information:   [address], [address] [place] [address] [phone]       Follow up with Karn Cassis, MD. Call today. (for follow up in 3-4 weeks.)    Specialty:  Neurosurgery   Contact information:   [address] [place] [address] [phone]       Follow up with Jetta Lout, NP On 06/03/2013. (Be there at 9 am for voiding trial. )    Specialty:  Urology   Contact information:   [address] [facility] [place] [facility] [place] [address] [phone]       Signed: Jacquelynn Cree 05/31/2013, 4:25 PM

## 2013-05-28 NOTE — Progress Notes (Signed)
Physical Therapy Discharge Summary  Patient Details  Name: [name] MRN: [id number] Date of Birth: [birth year]  Today's Date: 05/28/2013 Time: 0900-1000 session one            1420-1500 session two Time Calculation (min): 60 min session one                                              40 min session two  Patient has met 6 of 7 long term goals due to improved activity tolerance, decreased pain, improved balance and increased strength.  Patient to discharge at an ambulatory level Modified Independent.   Patient's care partner, wife is able to provide the necessary supervision assistance at discharge.  Reasons goals not met: Pt did not reach mod I ambulation in controlled environment secondary to requiring intermittent verbal cues for safety with RW and postural control correction  Recommendation:  Patient will benefit from ongoing skilled PT services in home health setting to continue to advance safe functional mobility, address ongoing impairments in core and LE strength, activity tolerance and endurance, balance, gait, and minimize fall risk.  Equipment: No equipment provided  Reasons for discharge: treatment goals met and discharge from hospital  Patient/family agrees with progress made and goals achieved: Yes  PT Discharge   Skilled intervention  Pt seen in two sessions today: Discharge complete, all stated goals met.   Session one:   Pt received from OT completing ADLS, pt completed donning of brief and pants with vcs to improve management of cathter. Sit to stand to complete dressing tasks with pt with mod I and vcs to improve upright posture. Discussed with pt discharge planning as set forth by PT with pt demonstrating good understanding and recall of back precautions and mobility and safety instructions. Pt performed bed mobility on bed in rehab apartment with rolling side to side independent and supine to sit mod I with pt requiring increased time. Gait training with RW  with supervision A for > 150 feet x 2 with slow gait pattern and min vcs to improve upright posture. Pt returned to room in w/c with all needs within reach.  Session two:   Pt received sitting in w/c. Pt given review and written handout with [facility] exs as per PT request prior visit. Pt's spouse arrived during treatment, reviewed and completed all d/c recommendations and instructions with pt displaying good verbal understanding.   Precautions/Restrictions Precautions Precautions: Back Required Braces or Orthoses: Spinal Brace Spinal Brace: Applied in sitting position Restrictions Weight Bearing Restrictions: No Pain Pain Assessment Pain Assessment: 0-10 Pain Score: 6  Pain Type: Surgical pain Pain Location: Back (LBP) Pain Orientation: Right Pain Descriptors / Indicators: Sharp;Shooting Pain Onset: With Activity Patients Stated Pain Goal: 3 Pain Intervention(s): RN made aware Multiple Pain Sites: No Vision/Perception  Vision - History Baseline Vision: Wears glasses all the time Visual History: Cataracts Patient Visual Report: Blurring of vision Vision - Assessment Eye Alignment: Within Functional Limits Vision Assessment: Vision not tested Perception Perception: Within Functional Limits Praxis Praxis: Intact  Cognition Overall Cognitive Status: History of cognitive impairments - at baseline Arousal/Alertness: Awake/alert Orientation Level: Oriented X4 Attention: Selective Sustained Attention: Appears intact Selective Attention: Appears intact Memory: Appears intact Awareness: Appears intact Problem Solving Impairment: Verbal basic;Functional basic Safety/Judgment: Appears intact Sensation Sensation Light Touch: Impaired by gross assessment Proprioception: Appears Intact Coordination Gross Motor  Movements are Fluid and Coordinated: Yes Fine Motor Movements are Fluid and Coordinated: Yes Motor  Motor Motor: Within Functional Limits  Mobility Bed Mobility Bed  Mobility: Supine to Sit;Sitting - Scoot to [place] of Bed;Sit to Supine Rolling Right: 7: Independent Rolling Left: 7: Independent Left Sidelying to Sit: 7: Independent Supine to Sit: 6: Modified independent (Device/Increase time) Sitting - Scoot to Edge of Bed: 6: Modified independent (Device/Increase time) Sit to Supine: 6: Modified independent (Device/Increase time) Sit to Sidelying Left: 7: Independent Transfers Transfers: Yes Sit to Stand: 6: Modified independent (Device/Increase time) Stand to Sit: 6: Modified independent (Device/Increase time) Stand Pivot Transfers: 6: Modified independent (Device/Increase time) Locomotion  Ambulation Ambulation: Yes Ambulation/Gait Assistance: 5: Supervision Assistive device: Rolling walker Ambulation/Gait Assistance Details: Tactile cues for posture;Verbal cues for technique;Verbal cues for precautions/safety Ambulation/Gait Assistance Details: cues to stay close to walker and improve upright posture. Gait Gait: Yes Gait Pattern: Decreased step length - right;Decreased step length - left;Step-through pattern Stairs / Additional Locomotion Stairs: Yes Stairs Assistance: 5: Supervision Stair Management Technique: Two rails  Trunk/Postural Assessment  Cervical Assessment Cervical Assessment: Within Functional Limits Thoracic Assessment Thoracic Assessment: Within Functional Limits Lumbar Assessment Lumbar Assessment: Exceptions to [facility] Lumbar AROM Overall Lumbar AROM Comments:  (Back Precautions) Postural Control Postural Control: Deficits on evaluation Trunk Control: Forward flexed posture improves with verbal and tactile cues.  Balance Static Sitting Balance Static Sitting - Balance Support: Feet supported Static Sitting - Level of Assistance: 7: Independent Static Standing Balance Static Standing - Balance Support: Bilateral upper extremity supported Static Standing - Level of Assistance: 5: Stand by assistance Extremity Assessment   RUE Assessment RUE Assessment: Exceptions to [facility] RUE AROM (degrees) Right Shoulder Flexion: 120 Degrees LUE Assessment LUE Assessment: Exceptions to WFL LUE AROM (degrees) LUE Overall AROM Comments: 130 sho. llexion RLE Assessment RLE Assessment: Within Functional Limits RLE AROM (degrees) Overall AROM Right Lower Extremity: Within functional limits for tasks assessed RLE Strength RLE Overall Strength: Within Functional Limits for tasks assessed RLE Overall Strength Comments: 4+/5 except for R hip flexion 4/5 LLE Assessment LLE Assessment: Within Functional Limits LLE AROM (degrees) Overall AROM Left Lower Extremity: Within functional limits for tasks assessed LLE Strength LLE Overall Strength Comments: Grossly 5/5 except for L hip flexion 4/5  See FIM for current functional status Individual therapy both sessions Shiela Mayer A  05/28/2013, 3:00 PM

## 2013-05-29 NOTE — Progress Notes (Signed)
Patient ID: [name], male   DOB: 05-27-1936, 77 y.o.   MRN: [id number] Subjective/Complaints: No complaints. Getting up with tech already this am. Alert, slept well.   Remains stable and ready for d/c today.   Objective: Vital Signs: Blood pressure 125/62, pulse 91, temperature 98.2 F (36.8 C), temperature source Oral, resp. rate 20, weight 105.507 kg (232 lb 9.6 oz), SpO2 96.00%. No results found.  Recent Labs  05/27/13 0430  WBC 6.6  HGB 9.6*  HCT 29.2*  PLT 256    Recent Labs  05/27/13 0430 05/28/13 0635  NA 138  --   K 3.8  --   CL 103  --   GLUCOSE 114*  --   BUN 17  --   CREATININE 0.71 0.76  CALCIUM 8.9  --    CBG (last 3)  No results found for this basename: GLUCAP,  in the last 72 hours  Wt Readings from Last 3 Encounters:  05/29/13 105.507 kg (232 lb 9.6 oz)  05/10/13 110.224 kg (243 lb)  12/02/11 95.255 kg (210 lb)    Patient Vitals for the past 24 hrs:  BP Temp Temp src Pulse Resp SpO2 Weight  05/29/13 0517 125/62 mmHg 98.2 F (36.8 C) - 91 20 96 % 105.507 kg (232 lb 9.6 oz)  05/28/13 2132 131/74 mmHg - - 88 - - -  05/28/13 1559 133/73 mmHg 97.9 F (36.6 C) Oral 87 18 100 % -    Intake/Output Summary (Last 24 hours) at 05/29/13 0846 Last data filed at 05/29/13 0521  Gross per 24 hour  Intake    960 ml  Output   1251 ml  Net   -291 ml    Physical Exam:  Head: Normocephalic and atraumatic.  Eyes: Conjunctivae are normal. Pupils are equal, round, and reactive to light. No scleral icterus.  Neck: Normal range of motion. Neck supple. No JVD present. No tracheal deviation present. No thyromegaly present.  Cardiovascular: Normal rate and regular rhythm. Exam reveals no friction rub.  No murmur heard.  Respiratory: Effort normal and breath sounds normal. No respiratory distress. He has no rales.   GI: Soft. Bowel sounds are normal.  Musculoskeletal:  Back tender. Incision nicely healing Lymphadenopathy:  He has no cervical adenopathy.   Neurological: He is alert and oriented to person, place, and time. He displays normal reflexes. No cranial nerve deficit. Coordination abnormal.     Assessment/Plan: 1. Functional deficits secondary to lumbar stenosis with spondylolisthesis and myelopathy, DISH with prior fusion which require 3+ hours per day of interdisciplinary therapy in a comprehensive inpatient rehab setting.  2. HTN- stable  Dc home today     LOS (Days) 8 A FACE TO FACE EVALUATION WAS PERFORMED  Rogelia Boga 05/29/2013 8:46 AM

## 2013-05-31 NOTE — Consult Note (Signed)
Agree with  PA note. stannenbaum

## 2013-05-31 NOTE — Progress Notes (Signed)
Social Work  Discharge Note  The overall goal for the admission was met for:   Discharge location: Yes - home with wife to provide 24/7 assistance  Length of Stay: Yes - 8 days  Discharge activity level: Yes - supervision overall  Home/community participation: Yes  Services provided included: MD, RD, PT, OT, RN, Pharmacy and SW  Financial Services: Medicare and Private Insurance: BCBS  Follow-up services arranged: Home Health: RN, PT via [facility] and Patient/Family has no preference for HH/DME agencies  Comments (or additional information):  Patient/Family verbalized understanding of follow-up arrangements: Yes  Individual responsible for coordination of the follow-up plan: patient  Confirmed correct DME delivered: no needs    Yohance Hathorne

## 2013-06-22 ENCOUNTER — Encounter: Payer: Medicare Other | Admitting: Physical Medicine & Rehabilitation

## 2013-07-18 ENCOUNTER — Emergency Department (HOSPITAL_COMMUNITY): Payer: Medicare Other

## 2013-07-18 ENCOUNTER — Encounter (HOSPITAL_COMMUNITY): Admission: EM | Disposition: E | Payer: Medicare Other | Source: Home / Self Care | Attending: Pulmonary Disease

## 2013-07-18 ENCOUNTER — Ambulatory Visit (HOSPITAL_COMMUNITY): Admit: 2013-07-18 | Payer: Medicare Other | Admitting: Cardiology

## 2013-07-18 ENCOUNTER — Inpatient Hospital Stay (HOSPITAL_COMMUNITY)
Admission: EM | Admit: 2013-07-18 | Discharge: 2013-08-12 | DRG: 246 | Disposition: E | Payer: Medicare Other | Attending: Pulmonary Disease | Admitting: Pulmonary Disease

## 2013-07-18 DIAGNOSIS — J9600 Acute respiratory failure, unspecified whether with hypoxia or hypercapnia: Secondary | ICD-10-CM | POA: Diagnosis present

## 2013-07-18 DIAGNOSIS — I469 Cardiac arrest, cause unspecified: Secondary | ICD-10-CM

## 2013-07-18 DIAGNOSIS — R570 Cardiogenic shock: Secondary | ICD-10-CM | POA: Diagnosis not present

## 2013-07-18 DIAGNOSIS — I2510 Atherosclerotic heart disease of native coronary artery without angina pectoris: Secondary | ICD-10-CM

## 2013-07-18 DIAGNOSIS — I213 ST elevation (STEMI) myocardial infarction of unspecified site: Secondary | ICD-10-CM

## 2013-07-18 DIAGNOSIS — R7309 Other abnormal glucose: Secondary | ICD-10-CM | POA: Diagnosis present

## 2013-07-18 DIAGNOSIS — M129 Arthropathy, unspecified: Secondary | ICD-10-CM | POA: Diagnosis present

## 2013-07-18 DIAGNOSIS — G40901 Epilepsy, unspecified, not intractable, with status epilepticus: Secondary | ICD-10-CM | POA: Diagnosis present

## 2013-07-18 DIAGNOSIS — Z955 Presence of coronary angioplasty implant and graft: Secondary | ICD-10-CM

## 2013-07-18 DIAGNOSIS — I10 Essential (primary) hypertension: Secondary | ICD-10-CM | POA: Diagnosis present

## 2013-07-18 DIAGNOSIS — E785 Hyperlipidemia, unspecified: Secondary | ICD-10-CM | POA: Diagnosis present

## 2013-07-18 DIAGNOSIS — Z87891 Personal history of nicotine dependence: Secondary | ICD-10-CM

## 2013-07-18 DIAGNOSIS — J690 Pneumonitis due to inhalation of food and vomit: Secondary | ICD-10-CM | POA: Diagnosis present

## 2013-07-18 DIAGNOSIS — I4729 Other ventricular tachycardia: Secondary | ICD-10-CM | POA: Diagnosis present

## 2013-07-18 DIAGNOSIS — K219 Gastro-esophageal reflux disease without esophagitis: Secondary | ICD-10-CM | POA: Diagnosis present

## 2013-07-18 DIAGNOSIS — I2109 ST elevation (STEMI) myocardial infarction involving other coronary artery of anterior wall: Principal | ICD-10-CM | POA: Diagnosis present

## 2013-07-18 DIAGNOSIS — Z515 Encounter for palliative care: Secondary | ICD-10-CM

## 2013-07-18 DIAGNOSIS — R569 Unspecified convulsions: Secondary | ICD-10-CM

## 2013-07-18 DIAGNOSIS — E876 Hypokalemia: Secondary | ICD-10-CM | POA: Diagnosis not present

## 2013-07-18 DIAGNOSIS — R739 Hyperglycemia, unspecified: Secondary | ICD-10-CM | POA: Diagnosis present

## 2013-07-18 DIAGNOSIS — N170 Acute kidney failure with tubular necrosis: Secondary | ICD-10-CM | POA: Diagnosis not present

## 2013-07-18 DIAGNOSIS — I472 Ventricular tachycardia, unspecified: Secondary | ICD-10-CM | POA: Diagnosis present

## 2013-07-18 DIAGNOSIS — E874 Mixed disorder of acid-base balance: Secondary | ICD-10-CM | POA: Diagnosis present

## 2013-07-18 DIAGNOSIS — I4901 Ventricular fibrillation: Secondary | ICD-10-CM | POA: Diagnosis present

## 2013-07-18 DIAGNOSIS — Z66 Do not resuscitate: Secondary | ICD-10-CM | POA: Diagnosis not present

## 2013-07-18 DIAGNOSIS — E873 Alkalosis: Secondary | ICD-10-CM | POA: Diagnosis present

## 2013-07-18 DIAGNOSIS — Z8546 Personal history of malignant neoplasm of prostate: Secondary | ICD-10-CM

## 2013-07-18 DIAGNOSIS — G40401 Other generalized epilepsy and epileptic syndromes, not intractable, with status epilepticus: Secondary | ICD-10-CM | POA: Diagnosis not present

## 2013-07-18 DIAGNOSIS — E871 Hypo-osmolality and hyponatremia: Secondary | ICD-10-CM | POA: Diagnosis present

## 2013-07-18 DIAGNOSIS — G931 Anoxic brain damage, not elsewhere classified: Secondary | ICD-10-CM | POA: Diagnosis present

## 2013-07-18 DIAGNOSIS — R4182 Altered mental status, unspecified: Secondary | ICD-10-CM

## 2013-07-18 DIAGNOSIS — E039 Hypothyroidism, unspecified: Secondary | ICD-10-CM | POA: Diagnosis present

## 2013-07-18 HISTORY — PX: LEFT HEART CATHETERIZATION WITH CORONARY ANGIOGRAM: SHX5451

## 2013-07-18 LAB — POCT I-STAT, CHEM 8
BUN: 12 mg/dL (ref 6–23)
Calcium, Ion: 1.16 mmol/L (ref 1.13–1.30)
Creatinine, Ser: 0.9 mg/dL (ref 0.50–1.35)
Glucose, Bld: 290 mg/dL — ABNORMAL HIGH (ref 70–99)
HCT: 41 % (ref 39.0–52.0)
TCO2: 20 mmol/L (ref 0–100)

## 2013-07-18 LAB — POCT I-STAT TROPONIN I: Troponin i, poc: 6.34 ng/mL (ref 0.00–0.08)

## 2013-07-18 LAB — CBC WITH DIFFERENTIAL/PLATELET
Basophils Relative: 0 % (ref 0–1)
Eosinophils Absolute: 0 10*3/uL (ref 0.0–0.7)
Eosinophils Relative: 0 % (ref 0–5)
HCT: 38 % — ABNORMAL LOW (ref 39.0–52.0)
Lymphocytes Relative: 36 % (ref 12–46)
Lymphs Abs: 5.7 10*3/uL — ABNORMAL HIGH (ref 0.7–4.0)
MCHC: 32.6 g/dL (ref 30.0–36.0)
MCV: 87.8 fL (ref 78.0–100.0)
Monocytes Absolute: 1 10*3/uL (ref 0.1–1.0)
Monocytes Relative: 6 % (ref 3–12)
Neutrophils Relative %: 58 % (ref 43–77)
RBC: 4.33 MIL/uL (ref 4.22–5.81)
WBC: 16.1 10*3/uL — ABNORMAL HIGH (ref 4.0–10.5)

## 2013-07-18 LAB — PROTIME-INR: Prothrombin Time: 14.8 seconds (ref 11.6–15.2)

## 2013-07-18 SURGERY — LEFT HEART CATHETERIZATION WITH CORONARY ANGIOGRAM
Anesthesia: LOCAL

## 2013-07-18 MED ORDER — ETOMIDATE 2 MG/ML IV SOLN
INTRAVENOUS | Status: AC | PRN
  Administered 2013-07-18: 20 mg via INTRAVENOUS

## 2013-07-18 MED ORDER — NITROGLYCERIN 0.2 MG/ML ON CALL CATH LAB
INTRAVENOUS | Status: AC
  Filled 2013-07-18: qty 1

## 2013-07-18 MED ORDER — LIDOCAINE HCL (PF) 1 % IJ SOLN
INTRAMUSCULAR | Status: AC
  Filled 2013-07-18: qty 30

## 2013-07-18 MED ORDER — ROCURONIUM BROMIDE 50 MG/5ML IV SOLN
INTRAVENOUS | Status: AC | PRN
  Administered 2013-07-18: 100 mg via INTRAVENOUS

## 2013-07-18 MED ORDER — HEPARIN (PORCINE) IN NACL 2-0.9 UNIT/ML-% IJ SOLN
INTRAMUSCULAR | Status: AC
  Filled 2013-07-18: qty 1500

## 2013-07-18 MED ORDER — TIROFIBAN HCL IV 12.5 MG/250 ML
INTRAVENOUS | Status: AC
  Administered 2013-07-19: 0.15 ug/kg/min via INTRAVENOUS
  Filled 2013-07-18: qty 250

## 2013-07-18 MED ORDER — HEPARIN SODIUM (PORCINE) 1000 UNIT/ML IJ SOLN
INTRAMUSCULAR | Status: AC
  Filled 2013-07-18: qty 1

## 2013-07-18 NOTE — ED Notes (Signed)
Per EMS: Patient c/o indigestion all day long. Pt was sitting on couch, wife walked away to use restroom when she came back he was lying unresponsive. Pt in VF upon EMS arrival. Defibrillated at 200J-->asytole afterwards. Pt received 3 doses of EPI en route. Converted to ST then to NSR. EMS infusing Cold Saline. King Airway in place during transport.

## 2013-07-18 NOTE — ED Provider Notes (Signed)
CSN: [id number]     Arrival date & time 08/02/2013  2319 History   First MD Initiated Contact with Patient 08/02/2013 2334     Chief Complaint  Patient presents with  . Cardiac Arrest   (Consider location/radiation/quality/duration/timing/severity/associated sxs/prior Treatment) HPI 77 year old male presents to emergency department.  His cardiac arrest.  Per EMS reports, wife, left the room, and he was found unresponsive.  Fire rescue arrived, and move the patient from to the couch to the floor and began CPR.  Patient was found to be in V. tach upon EMS arrival.  He received one defibrillation.  He was noted to be in asystole.  He had 3 rounds of epinephrine and CPR.  Patient had return of spontaneous circulation with sinus tach.  EKG concerning for STEMI with ST depressions inferiorly and elevations in V2 and V3.  EKG was of poor quality.  Patient arrives to the emergency department with a King airway in place, agonal respirations.  No prior history aside from GERD and arthritis available.  Per EMS, patient has been complaining of indigestion throughout the day today.  EMS infusing cold saline. Past Medical History  Diagnosis Date  . Prostate cancer     hx of prostate ca  . Hypertension   . Thyroid disease   . Hypothyroidism   . Cough     due to scarring of lung  . GERD (gastroesophageal reflux disease)   . Arthritis   . Complication of anesthesia     sometimes have trouble intubating not last 2 times; limited neck mobility--glidescope used in '08 and '10   Past Surgical History  Procedure Laterality Date  . Lung surgery Right 09    growth on outside of lung  . Tonsillectomy    . Appendectomy    . Shoulder surgery Left     rotator cuff x2  . Back surgery    . Cervical spine surgery    . Knee arthroscopy Right   . Eye surgery Bilateral 10   No family history on file. History  Substance Use Topics  . Smoking status: Former Smoker -- 3.00 packs/day for 15 years    Types: Cigarettes     Quit date: 05/10/1982  . Smokeless tobacco: Not on file  . Alcohol Use: No    Review of Systems  Unable to perform ROS: Acuity of condition    Allergies  Review of patient's allergies indicates no known allergies.  Home Medications  No current outpatient prescriptions on file. BP 119/81  Pulse 120  Resp 16  SpO2 99% Physical Exam  Constitutional:  Elderly male, unresponsive   HENT:  Head: Normocephalic and atraumatic.  Nose: Nose normal.  Mouth/Throat: Oropharynx is clear and moist.  Eyes:  Pupils are fixed and dilated  Neck: Normal range of motion. Neck supple. No JVD present. No tracheal deviation present. No thyromegaly present.  Cardiovascular:  Tachycardia noted  Pulmonary/Chest: No stridor.  Agonal respirations, King airway in place.  Patient being bagged  Abdominal: Soft. Bowel sounds are normal. He exhibits no distension and no mass. There is no tenderness. There is no rebound and no guarding.  Lymphadenopathy:    He has no cervical adenopathy.  Skin: Skin is warm and dry. No rash noted. No erythema. No pallor.    ED Course  INTUBATION Date/Time: 07/21/2013 11:47 PM Performed by: Olivia Mackie Authorized by: Olivia Mackie Consent: The procedure was performed in an emergent situation. Time out: Immediately prior to procedure a "time out"  was called to verify the correct patient, procedure, equipment, support staff and site/side marked as required. Indications: respiratory failure Intubation method: video-assisted Patient status: paralyzed (RSI) Preoxygenation: nonrebreather mask Sedatives: etomidate Paralytic: rocuronium Laryngoscope size: Miller 4 Tube size: 6.5 mm Number of attempts: 3 Ventilation between attempts: BVM Cricoid pressure: no Cords visualized: yes Post-procedure assessment: chest rise and CO2 detector Breath sounds: equal Cuff inflated: yes ETT to lip: 22 cm Tube secured with: ETT holder Patient tolerance: Patient tolerated the  procedure well with no immediate complications.  CRITICAL CARE Performed by: Olivia Mackie Authorized by: Olivia Mackie Total critical care time: 35 minutes Critical care time was exclusive of separately billable procedures and treating other patients. Critical care was necessary to treat or prevent imminent or life-threatening deterioration of the following conditions: cardiac failure. Critical care was time spent personally by me on the following activities: development of treatment plan with patient or surrogate, discussions with consultants, evaluation of patient's response to treatment, examination of patient, obtaining history from patient or surrogate, ordering and performing treatments and interventions, ordering and review of laboratory studies, ordering and review of radiographic studies, pulse oximetry, re-evaluation of patient's condition and review of old charts.   (including critical care time) Labs Review Labs Reviewed  CBC WITH DIFFERENTIAL - Abnormal; Notable for the following:    WBC 16.1 (*)    Hemoglobin 12.4 (*)    HCT 38.0 (*)    Neutro Abs 9.3 (*)    Lymphs Abs 5.7 (*)    All other components within normal limits  POCT I-STAT, CHEM 8 - Abnormal; Notable for the following:    Glucose, Bld 290 (*)    All other components within normal limits  CG4 I-STAT (LACTIC ACID) - Abnormal; Notable for the following:    Lactic Acid, Venous 5.53 (*)    All other components within normal limits  POCT I-STAT TROPONIN I - Abnormal; Notable for the following:    Troponin i, poc 6.34 (*)    All other components within normal limits  BASIC METABOLIC PANEL  PRO B NATRIURETIC PEPTIDE  PROTIME-INR  APTT  TROPONIN I  BLOOD GAS, ARTERIAL   Imaging Review No results found.  EKG Interpretation    Date/Time:  Sunday July 18 2013 23:25:09 EST Ventricular Rate:  113 PR Interval:  196 QRS Duration: 74 QT Interval:  350 QTC Calculation: 480 R Axis:   -31 Text  Interpretation:  Sinus tachycardia Paired ventricular premature complexes ST elevation V2-4 poor baseline ** ** ACUTE MI / STEMI ** ** Confirmed by Aseret Hoffman  MD, Treana Lacour ([id number]) on 07/19/2013 7:23:21 AM            MDM   1. Cardiac arrest   2. STEMI (ST elevation myocardial infarction)    76 year old male with cardiac arrest, return of spontaneous circulation with signs of STEMI on EKG.  Patient taken emergently to the Cath Lab after [facility] airway switched out with ET tube.  Some difficulty with placement of ETT, but maintained sats throughout    Olivia Mackie, MD 07/19/13 [phone]

## 2013-07-18 NOTE — H&P (Addendum)
[name] is an 77 y.o. male.   Chief Complaint: cardiac arrest HPI: 77 yo man with PMH of hypertension, hypothyroidism with recent October hospitalization with laminectomy in lumbar region who was BIBA after cardiac arrest at home. His wife left their living and came back and found her husband unresponsive leading her to call EMS. On arrival, [name] had no pulse, CPR started, initial rhythm VT, shocked x1, then asystole with CPR 1-2 minutes, next rhythm shockable and returned to tachycardia. Code STEMI called and cath lab activated. [name] intubated in the ER, chart reviewed, Critical Care consulted and now [name] is in the cath lab.   Past Medical History  Diagnosis Date  . Prostate cancer     hx of prostate ca  . Hypertension   . Thyroid disease   . Hypothyroidism   . Cough     due to scarring of lung  . GERD (gastroesophageal reflux disease)   . Arthritis   . Complication of anesthesia     sometimes have trouble intubating not last 2 times; limited neck mobility--glidescope used in '08 and '10    Past Surgical History  Procedure Laterality Date  . Lung surgery Right 09    growth on outside of lung  . Tonsillectomy    . Appendectomy    . Shoulder surgery Left     rotator cuff x2  . Back surgery    . Cervical spine surgery    . Knee arthroscopy Right   . Eye surgery Bilateral 10    No family history on file. Social History:  reports that he quit smoking about 31 years ago. His smoking use included Cigarettes. He has a 45 pack-year smoking history. He does not have any smokeless tobacco history on file. He reports that he does not drink alcohol or use illicit drugs.  Allergies: No Known Allergies   (Not in a hospital admission)  Results for orders placed during the hospital encounter of 08/10/2013 (from the past 48 hour(s))  POCT I-STAT, CHEM 8     Status: Abnormal   Collection Time    07/14/2013 11:34 PM      Result Value Range   Sodium 138  135 - 145 mEq/L    Potassium 3.5  3.5 - 5.1 mEq/L   Chloride 102  96 - 112 mEq/L   BUN 12  6 - 23 mg/dL   Creatinine, Ser 1.61  0.50 - 1.35 mg/dL   Glucose, Bld 096 (*) 70 - 99 mg/dL   Calcium, Ion 0.45  4.09 - 1.30 mmol/L   TCO2 20  0 - 100 mmol/L   Hemoglobin 13.9  13.0 - 17.0 g/dL   HCT 81.1  91.4 - 78.2 %  CG4 I-STAT (LACTIC ACID)     Status: Abnormal   Collection Time    07/13/2013 11:34 PM      Result Value Range   Lactic Acid, Venous 5.53 (*) 0.5 - 2.2 mmol/L   No results found.  Review of Systems  Unable to perform ROS: critical illness    Blood pressure 119/81, pulse 120, resp. rate 16, SpO2 99.00%. Physical Exam  Nursing note and vitals reviewed. Constitutional: He appears well-developed and well-nourished. He appears distressed.  HENT:  Intubated, sedated  Eyes:  Intubated, sedated  Neck: Neck supple. No JVD present.  Cardiovascular:  Tachycardic, ? irregular  Respiratory: He is in respiratory distress. He has rales.  Scattered rales  GI: He exhibits distension. There is no tenderness.  There is no rebound.  Distended abdomen, no BS  Musculoskeletal:  Trace LEE bilaterally  Neurological:  Intubated, sedated (after medications for intubation)  Skin: He is diaphoretic.  Lukewarm  Labs being reviewed as they come in  Problem List Cardiac Arrest with CPR VT with shock x 2 Prior Prostate cancer Recent Laminectomy  Prior tobacco use Hypertension Dyslipidemia  Assessment/Plan 77 yo man with prostate cancer, laminectomy, prior tobacco use, hypertension, dyslipidemia now s/p cardiac arrest and going to cath lab.  - cath lab activation in cath lab now - critical care consult for ventilatory management - Cooling protocol  - aspirin, heparin - defer BB until hemodynamics declared - atorvastatin 80 mg qHS, first dose now - TSH, BNP, hba1c, lipid panel - holding antihypertensives for acute illness  Peddie, Deronte Solis 07/31/2013, 11:37 PM

## 2013-07-18 NOTE — ED Notes (Signed)
I stat troponin results given to Dr. Otter by B. Haynes, EMT 

## 2013-07-18 NOTE — ED Notes (Signed)
i stat lactic acid results given to Dr. Otter by B. Haynes, EMT 

## 2013-07-18 NOTE — Code Documentation (Signed)
Cath Lab ready at this time.

## 2013-07-19 ENCOUNTER — Inpatient Hospital Stay (HOSPITAL_COMMUNITY): Payer: Medicare Other

## 2013-07-19 ENCOUNTER — Encounter (HOSPITAL_COMMUNITY): Payer: Self-pay | Admitting: Internal Medicine

## 2013-07-19 DIAGNOSIS — E871 Hypo-osmolality and hyponatremia: Secondary | ICD-10-CM | POA: Diagnosis present

## 2013-07-19 DIAGNOSIS — I2510 Atherosclerotic heart disease of native coronary artery without angina pectoris: Secondary | ICD-10-CM

## 2013-07-19 DIAGNOSIS — J9600 Acute respiratory failure, unspecified whether with hypoxia or hypercapnia: Secondary | ICD-10-CM

## 2013-07-19 DIAGNOSIS — R570 Cardiogenic shock: Secondary | ICD-10-CM | POA: Diagnosis present

## 2013-07-19 DIAGNOSIS — I517 Cardiomegaly: Secondary | ICD-10-CM

## 2013-07-19 DIAGNOSIS — R739 Hyperglycemia, unspecified: Secondary | ICD-10-CM | POA: Diagnosis present

## 2013-07-19 DIAGNOSIS — I469 Cardiac arrest, cause unspecified: Secondary | ICD-10-CM

## 2013-07-19 LAB — GLUCOSE, CAPILLARY
Glucose-Capillary: 217 mg/dL — ABNORMAL HIGH (ref 70–99)
Glucose-Capillary: 214 mg/dL — ABNORMAL HIGH (ref 70–99)
Glucose-Capillary: 251 mg/dL — ABNORMAL HIGH (ref 70–99)
Glucose-Capillary: 284 mg/dL — ABNORMAL HIGH (ref 70–99)
Glucose-Capillary: 305 mg/dL — ABNORMAL HIGH (ref 70–99)
Glucose-Capillary: 259 mg/dL — ABNORMAL HIGH (ref 70–99)
Glucose-Capillary: 245 mg/dL — ABNORMAL HIGH (ref 70–99)
Glucose-Capillary: 232 mg/dL — ABNORMAL HIGH (ref 70–99)
Glucose-Capillary: 214 mg/dL — ABNORMAL HIGH (ref 70–99)
Glucose-Capillary: 232 mg/dL — ABNORMAL HIGH (ref 70–99)
Glucose-Capillary: 212 mg/dL — ABNORMAL HIGH (ref 70–99)

## 2013-07-19 LAB — BASIC METABOLIC PANEL
BUN: 8 mg/dL (ref 6–23)
BUN: 7 mg/dL (ref 6–23)
CO2: 18 mEq/L — ABNORMAL LOW (ref 19–32)
CO2: 18 mEq/L — ABNORMAL LOW (ref 19–32)
CO2: 10 mEq/L — CL (ref 19–32)
Calcium: 8.3 mg/dL — ABNORMAL LOW (ref 8.4–10.5)
Calcium: 7.3 mg/dL — ABNORMAL LOW (ref 8.4–10.5)
Chloride: 103 mEq/L (ref 96–112)
Creatinine, Ser: 0.75 mg/dL (ref 0.50–1.35)
Creatinine, Ser: 0.53 mg/dL (ref 0.50–1.35)
GFR calc Af Amer: >90 mL/min (ref >90)
GFR calc Af Amer: >90 mL/min (ref >90)
GFR calc Af Amer: >90 mL/min (ref >90)
GFR calc non Af Amer: >90 mL/min (ref >90)
GFR calc non Af Amer: >90 mL/min (ref >90)
Glucose, Bld: 290 mg/dL — ABNORMAL HIGH (ref 70–99)
Glucose, Bld: 217 mg/dL — ABNORMAL HIGH (ref 70–99)
Glucose, Bld: 231 mg/dL — ABNORMAL HIGH (ref 70–99)
Glucose, Bld: 252 mg/dL — ABNORMAL HIGH (ref 70–99)
Potassium: <2 mEq/L — CL (ref 3.5–5.1)
Potassium: 2.6 mEq/L — CL (ref 3.5–5.1)
Sodium: 134 mEq/L — ABNORMAL LOW (ref 135–145)
Sodium: 138 mEq/L (ref 135–145)

## 2013-07-19 LAB — LIPID PANEL
Cholesterol: 134 mg/dL (ref 0–200)
HDL: 48 mg/dL (ref >39)
LDL Cholesterol: 75 mg/dL (ref 0–99)
Triglycerides: 57 mg/dL (ref <150)

## 2013-07-19 LAB — BLOOD GAS, ARTERIAL
Bicarbonate: 20.7 mEq/L (ref 20.0–24.0)
Drawn by: 23604
Drawn by: 331761
MECHVT: 5803 mL
MECHVT: 580 mL
O2 Saturation: 98.5 %
O2 Saturation: 98.3 %
PEEP: 5 cmH2O
PEEP: 5 cmH2O
Patient temperature: 98.6
Patient temperature: 91.4
RATE: 30 resp/min
RATE: 30 resp/min
TCO2: 21.8 mmol/L (ref 0–100)
pH, Arterial: 7.404 (ref 7.350–7.450)
pH, Arterial: 7.343 — ABNORMAL LOW (ref 7.350–7.450)
pO2, Arterial: 98.4 mmHg (ref 80.0–100.0)

## 2013-07-19 LAB — PROTIME-INR
INR: 1.19 (ref 0.00–1.49)
Prothrombin Time: 15.6 seconds — ABNORMAL HIGH (ref 11.6–15.2)
Prothrombin Time: 14.8 seconds (ref 11.6–15.2)

## 2013-07-19 LAB — POCT I-STAT 3, ART BLOOD GAS (G3+)
Acid-base deficit: 11 mmol/L — ABNORMAL HIGH (ref 0.0–2.0)
Acid-base deficit: 5 mmol/L — ABNORMAL HIGH (ref 0.0–2.0)
Bicarbonate: 17 mEq/L — ABNORMAL LOW (ref 20.0–24.0)
Bicarbonate: 19.1 mEq/L — ABNORMAL LOW (ref 20.0–24.0)
O2 Saturation: 100 %
O2 Saturation: 100 %
O2 Saturation: 100 %
TCO2: 18 mmol/L (ref 0–100)
TCO2: 20 mmol/L (ref 0–100)
pCO2 arterial: 33.3 mmHg — ABNORMAL LOW (ref 35.0–45.0)
pCO2 arterial: 47.6 mmHg — ABNORMAL HIGH (ref 35.0–45.0)
pH, Arterial: 7.19 — CL (ref 7.350–7.450)
pH, Arterial: 7.251 — ABNORMAL LOW (ref 7.350–7.450)
pH, Arterial: 7.366 (ref 7.350–7.450)
pO2, Arterial: 255 mmHg — ABNORMAL HIGH (ref 80.0–100.0)
pO2, Arterial: 270 mmHg — ABNORMAL HIGH (ref 80.0–100.0)

## 2013-07-19 LAB — TROPONIN I
Troponin I: 10.2 ng/mL (ref <0.30)
Troponin I: 16.05 ng/mL (ref <0.30)
Troponin I: 13.7 ng/mL (ref <0.30)

## 2013-07-19 LAB — MAGNESIUM
Magnesium: 1.7 mg/dL (ref 1.5–2.5)
Magnesium: 2.1 mg/dL (ref 1.5–2.5)

## 2013-07-19 LAB — POCT ACTIVATED CLOTTING TIME
Activated Clotting Time: 191 seconds
Activated Clotting Time: 252 seconds
Activated Clotting Time: 273 seconds
Activated Clotting Time: 293 seconds
Activated Clotting Time: 478 seconds

## 2013-07-19 LAB — CBC
MCH: 28.2 pg (ref 26.0–34.0)
MCHC: 33.2 g/dL (ref 30.0–36.0)
MCV: 84.9 fL (ref 78.0–100.0)
Platelets: 254 10*3/uL (ref 150–400)
RBC: 3.83 MIL/uL — ABNORMAL LOW (ref 4.22–5.81)
RDW: 14.1 % (ref 11.5–15.5)

## 2013-07-19 LAB — APTT: aPTT: 123 seconds — ABNORMAL HIGH (ref 24–37)

## 2013-07-19 LAB — HEMOGLOBIN A1C: Mean Plasma Glucose: 126 mg/dL — ABNORMAL HIGH (ref <117)

## 2013-07-19 LAB — TSH: TSH: 0.582 u[IU]/mL (ref 0.350–4.500)

## 2013-07-19 LAB — MRSA PCR SCREENING: MRSA by PCR: NEGATIVE

## 2013-07-19 MED ORDER — NITROGLYCERIN IN D5W 200-5 MCG/ML-% IV SOLN: 5.0000 ug/min | INTRAVENOUS | Status: DC

## 2013-07-19 MED ORDER — POTASSIUM CHLORIDE 10 MEQ/50ML IV SOLN
10.0000 meq | INTRAVENOUS | Status: AC
  Administered 2013-07-19 (×3): 10 meq via INTRAVENOUS

## 2013-07-19 MED ORDER — SODIUM CHLORIDE 0.9 % IV SOLN
250.0000 mL | INTRAVENOUS | Status: DC | PRN
  Administered 2013-07-23 – 2013-07-24 (×2): 250 mL via INTRAVENOUS

## 2013-07-19 MED ORDER — ONDANSETRON HCL 4 MG/2ML IJ SOLN: 4.0000 mg | Freq: Four times a day (QID) | INTRAMUSCULAR | Status: DC | PRN

## 2013-07-19 MED ORDER — AMIODARONE HCL IN DEXTROSE 360-4.14 MG/200ML-% IV SOLN
60.0000 mg/h | INTRAVENOUS | Status: AC
  Administered 2013-07-19 (×2): 60 mg/h via INTRAVENOUS
  Filled 2013-07-19: qty 200

## 2013-07-19 MED ORDER — HYDRALAZINE HCL 20 MG/ML IJ SOLN
INTRAMUSCULAR | Status: AC
  Filled 2013-07-19: qty 1

## 2013-07-19 MED ORDER — FENTANYL BOLUS VIA INFUSION
50.0000 ug | INTRAVENOUS | Status: DC | PRN
  Administered 2013-07-21: 50 ug via INTRAVENOUS
  Filled 2013-07-19: qty 50

## 2013-07-19 MED ORDER — HYDRALAZINE HCL 20 MG/ML IJ SOLN
20.0000 mg | INTRAMUSCULAR | Status: DC | PRN
  Administered 2013-07-19: 20 mg via INTRAVENOUS

## 2013-07-19 MED ORDER — SODIUM BICARBONATE 8.4 % IV SOLN
INTRAVENOUS | Status: DC
  Administered 2013-07-19 – 2013-07-21 (×3): via INTRAVENOUS
  Filled 2013-07-19 (×4): qty 150

## 2013-07-19 MED ORDER — DOPAMINE-DEXTROSE 3.2-5 MG/ML-% IV SOLN
INTRAVENOUS | Status: AC
  Filled 2013-07-19: qty 250

## 2013-07-19 MED ORDER — ASPIRIN 81 MG PO CHEW: 324.0000 mg | CHEWABLE_TABLET | ORAL | Status: AC

## 2013-07-19 MED ORDER — DEXTROSE 5 % IV SOLN
0.5000 ug/min | INTRAVENOUS | Status: DC
  Administered 2013-07-19: 10 ug/min via INTRAVENOUS
  Filled 2013-07-19 (×2): qty 4

## 2013-07-19 MED ORDER — POTASSIUM CHLORIDE 20 MEQ/15ML (10%) PO LIQD
ORAL | Status: AC
  Filled 2013-07-19: qty 45

## 2013-07-19 MED ORDER — ASPIRIN 81 MG PO CHEW
81.0000 mg | CHEWABLE_TABLET | Freq: Every day | ORAL | Status: DC
  Administered 2013-07-19 – 2013-07-23 (×5): 81 mg
  Filled 2013-07-19 (×5): qty 1

## 2013-07-19 MED ORDER — TICAGRELOR 90 MG PO TABS
180.0000 mg | ORAL_TABLET | Freq: Once | ORAL | Status: AC
  Administered 2013-07-19: 180 mg via ORAL
  Filled 2013-07-19: qty 2

## 2013-07-19 MED ORDER — MIDAZOLAM HCL 2 MG/2ML IJ SOLN: 2.0000 mg | Freq: Once | INTRAMUSCULAR | Status: AC | PRN

## 2013-07-19 MED ORDER — FENTANYL CITRATE 0.05 MG/ML IJ SOLN
100.0000 ug | Freq: Once | INTRAMUSCULAR | Status: AC
  Administered 2013-07-19: 100 ug via INTRAVENOUS

## 2013-07-19 MED ORDER — BIOTENE DRY MOUTH MT LIQD
15.0000 mL | Freq: Four times a day (QID) | OROMUCOSAL | Status: DC
  Administered 2013-07-19 – 2013-07-24 (×20): 15 mL via OROMUCOSAL

## 2013-07-19 MED ORDER — MORPHINE SULFATE 4 MG/ML IJ SOLN
4.0000 mg | Freq: Once | INTRAMUSCULAR | Status: AC
  Administered 2013-07-19: 4 mg via INTRAVENOUS

## 2013-07-19 MED ORDER — SODIUM CHLORIDE 0.9 % IV SOLN
1.0000 ug/kg/min | INTRAVENOUS | Status: DC
  Administered 2013-07-19 – 2013-07-20 (×2): 1 ug/kg/min via INTRAVENOUS
  Filled 2013-07-19 (×2): qty 20

## 2013-07-19 MED ORDER — NITROGLYCERIN 0.4 MG SL SUBL: 0.4000 mg | SUBLINGUAL_TABLET | SUBLINGUAL | Status: DC | PRN

## 2013-07-19 MED ORDER — POTASSIUM CHLORIDE 10 MEQ/50ML IV SOLN
INTRAVENOUS | Status: AC
  Filled 2013-07-19: qty 50

## 2013-07-19 MED ORDER — CISATRACURIUM BOLUS VIA INFUSION
0.1000 mg/kg | Freq: Once | INTRAVENOUS | Status: AC
  Administered 2013-07-19: 10.5 mg via INTRAVENOUS
  Filled 2013-07-19: qty 11

## 2013-07-19 MED ORDER — ASPIRIN 300 MG RE SUPP
300.0000 mg | RECTAL | Status: AC
  Administered 2013-07-19: 300 mg via RECTAL
  Filled 2013-07-19: qty 1

## 2013-07-19 MED ORDER — LEVOTHYROXINE SODIUM 75 MCG PO TABS
75.0000 ug | ORAL_TABLET | ORAL | Status: DC
  Administered 2013-07-24: 75 ug via ORAL
  Filled 2013-07-19 (×2): qty 1

## 2013-07-19 MED ORDER — DEXTROSE 5 % IV SOLN
30.0000 ug/min | INTRAVENOUS | Status: DC
  Administered 2013-07-19 (×2): 100 ug/min via INTRAVENOUS
  Administered 2013-07-20: 70 ug/min via INTRAVENOUS
  Filled 2013-07-19 (×5): qty 4

## 2013-07-19 MED ORDER — CHLORHEXIDINE GLUCONATE 0.12 % MT SOLN
15.0000 mL | Freq: Two times a day (BID) | OROMUCOSAL | Status: DC
  Administered 2013-07-19 – 2013-07-23 (×9): 15 mL via OROMUCOSAL
  Filled 2013-07-19 (×9): qty 15

## 2013-07-19 MED ORDER — ATORVASTATIN CALCIUM 80 MG PO TABS: 80.0000 mg | ORAL_TABLET | Freq: Every day | ORAL | Status: DC

## 2013-07-19 MED ORDER — CHLORHEXIDINE GLUCONATE 0.12 % MT SOLN
OROMUCOSAL | Status: AC
  Administered 2013-07-19: 15 mL
  Filled 2013-07-19: qty 15

## 2013-07-19 MED ORDER — SODIUM CHLORIDE 0.9 % IV SOLN
25.0000 ug/h | INTRAVENOUS | Status: DC
  Administered 2013-07-19 – 2013-07-20 (×2): 50 ug/h via INTRAVENOUS
  Filled 2013-07-19 (×2): qty 50

## 2013-07-19 MED ORDER — SODIUM CHLORIDE 0.9 % IJ SOLN: 3.0000 mL | INTRAMUSCULAR | Status: DC | PRN

## 2013-07-19 MED ORDER — LEVOTHYROXINE SODIUM 50 MCG PO TABS
50.0000 ug | ORAL_TABLET | ORAL | Status: DC
  Administered 2013-07-19 – 2013-07-23 (×5): 50 ug via ORAL
  Filled 2013-07-19 (×5): qty 1

## 2013-07-19 MED ORDER — POTASSIUM CHLORIDE 20 MEQ/15ML (10%) PO LIQD
50.0000 meq | Freq: Once | ORAL | Status: AC
  Administered 2013-07-19: 50 meq

## 2013-07-19 MED ORDER — MIDAZOLAM HCL 2 MG/2ML IJ SOLN
INTRAMUSCULAR | Status: AC
  Filled 2013-07-19: qty 2

## 2013-07-19 MED ORDER — MAGNESIUM SULFATE 40 MG/ML IJ SOLN
2.0000 g | Freq: Once | INTRAMUSCULAR | Status: AC
  Administered 2013-07-19: 2 g via INTRAVENOUS
  Filled 2013-07-19: qty 50

## 2013-07-19 MED ORDER — FENTANYL CITRATE 0.05 MG/ML IJ SOLN
INTRAMUSCULAR | Status: AC
  Filled 2013-07-19: qty 2

## 2013-07-19 MED ORDER — ASPIRIN EC 325 MG PO TBEC: 325.0000 mg | DELAYED_RELEASE_TABLET | Freq: Once | ORAL | Status: AC

## 2013-07-19 MED ORDER — PANTOPRAZOLE SODIUM 40 MG IV SOLR
40.0000 mg | INTRAVENOUS | Status: DC
  Administered 2013-07-19 – 2013-07-21 (×3): 40 mg via INTRAVENOUS
  Filled 2013-07-19 (×4): qty 40

## 2013-07-19 MED ORDER — ATORVASTATIN CALCIUM 80 MG PO TABS
80.0000 mg | ORAL_TABLET | Freq: Every day | ORAL | Status: DC
  Administered 2013-07-19 – 2013-07-23 (×5): 80 mg
  Filled 2013-07-19 (×2): qty 1

## 2013-07-19 MED ORDER — INSULIN ASPART 100 UNIT/ML ~~LOC~~ SOLN
2.0000 [IU] | SUBCUTANEOUS | Status: DC
Start: 2013-07-19 — End: 2013-07-19
  Administered 2013-07-19 (×2): 6 [IU] via SUBCUTANEOUS

## 2013-07-19 MED ORDER — PHENYLEPHRINE HCL 10 MG/ML IJ SOLN
30.0000 ug/min | INTRAVENOUS | Status: DC
  Administered 2013-07-19: 50 ug/min via INTRAVENOUS
  Filled 2013-07-19: qty 1

## 2013-07-19 MED ORDER — AMIODARONE HCL IN DEXTROSE 360-4.14 MG/200ML-% IV SOLN
INTRAVENOUS | Status: AC
  Administered 2013-07-19: 60 mg/h via INTRAVENOUS
  Filled 2013-07-19: qty 200

## 2013-07-19 MED ORDER — AMIODARONE LOAD VIA INFUSION
150.0000 mg | Freq: Once | INTRAVENOUS | Status: AC
  Administered 2013-07-19: 150 mg via INTRAVENOUS
  Filled 2013-07-19: qty 83.34

## 2013-07-19 MED ORDER — ACETAMINOPHEN 325 MG PO TABS
650.0000 mg | ORAL_TABLET | ORAL | Status: DC | PRN
  Administered 2013-07-21 (×2): 650 mg via ORAL
  Filled 2013-07-19 (×2): qty 2

## 2013-07-19 MED ORDER — POTASSIUM CHLORIDE 10 MEQ/50ML IV SOLN
10.0000 meq | INTRAVENOUS | Status: AC
  Administered 2013-07-19 (×5): 10 meq via INTRAVENOUS
  Filled 2013-07-19 (×2): qty 50

## 2013-07-19 MED ORDER — TIROFIBAN HCL IV 5 MG/100ML
0.1500 ug/kg/min | INTRAVENOUS | Status: AC
  Administered 2013-07-19 (×2): 0.15 ug/kg/min via INTRAVENOUS
  Filled 2013-07-19 (×5): qty 100

## 2013-07-19 MED ORDER — ASPIRIN 300 MG RE SUPP
300.0000 mg | RECTAL | Status: AC
  Filled 2013-07-19: qty 1

## 2013-07-19 MED ORDER — NOREPINEPHRINE BITARTRATE 1 MG/ML IJ SOLN
0.5000 ug/min | INTRAVENOUS | Status: DC
  Administered 2013-07-19: 50 ug/min via INTRAVENOUS
  Administered 2013-07-19: 45 ug/min via INTRAVENOUS
  Administered 2013-07-19: 12 ug/min via INTRAVENOUS
  Administered 2013-07-20: 30 ug/min via INTRAVENOUS
  Administered 2013-07-20: 50 ug/min via INTRAVENOUS
  Administered 2013-07-20: 40 ug/min via INTRAVENOUS
  Administered 2013-07-21 – 2013-07-23 (×6): 50 ug/min via INTRAVENOUS
  Administered 2013-07-23 (×2): 35 ug/min via INTRAVENOUS
  Administered 2013-07-23: 20 ug/min via INTRAVENOUS
  Filled 2013-07-19 (×14): qty 16

## 2013-07-19 MED ORDER — CISATRACURIUM BOLUS VIA INFUSION
0.0500 mg/kg | INTRAVENOUS | Status: DC | PRN
  Filled 2013-07-19: qty 6

## 2013-07-19 MED ORDER — TICAGRELOR 90 MG PO TABS
90.0000 mg | ORAL_TABLET | Freq: Two times a day (BID) | ORAL | Status: DC
  Administered 2013-07-19 – 2013-07-20 (×3): 90 mg via ORAL
  Filled 2013-07-19 (×5): qty 1

## 2013-07-19 MED ORDER — ARTIFICIAL TEARS OP OINT
TOPICAL_OINTMENT | OPHTHALMIC | Status: AC
  Filled 2013-07-19: qty 3.5

## 2013-07-19 MED ORDER — SODIUM CHLORIDE 0.9 % IV SOLN
2000.0000 mL | Freq: Once | INTRAVENOUS | Status: AC
  Administered 2013-07-19: 1000 mL via INTRAVENOUS

## 2013-07-19 MED ORDER — ASPIRIN 300 MG RE SUPP
300.0000 mg | Freq: Once | RECTAL | Status: AC
  Filled 2013-07-19: qty 1

## 2013-07-19 MED ORDER — MIDAZOLAM BOLUS VIA INFUSION
2.0000 mg | INTRAVENOUS | Status: DC | PRN
  Filled 2013-07-19: qty 2

## 2013-07-19 MED ORDER — MIDAZOLAM HCL 2 MG/2ML IJ SOLN
2.0000 mg | Freq: Once | INTRAMUSCULAR | Status: AC
  Administered 2013-07-19: 2 mg via INTRAVENOUS

## 2013-07-19 MED ORDER — POTASSIUM CHLORIDE 10 MEQ/50ML IV SOLN
10.0000 meq | INTRAVENOUS | Status: AC
  Administered 2013-07-19 (×6): 10 meq via INTRAVENOUS
  Filled 2013-07-19: qty 50

## 2013-07-19 MED ORDER — DOPAMINE-DEXTROSE 3.2-5 MG/ML-% IV SOLN
10.0000 ug/kg/min | INTRAVENOUS | Status: DC
  Administered 2013-07-19: 10 ug/kg/min via INTRAVENOUS

## 2013-07-19 MED ORDER — FENTANYL CITRATE 0.05 MG/ML IJ SOLN: 100.0000 ug | Freq: Once | INTRAMUSCULAR | Status: AC | PRN

## 2013-07-19 MED ORDER — AMIODARONE HCL IN DEXTROSE 360-4.14 MG/200ML-% IV SOLN
30.0000 mg/h | INTRAVENOUS | Status: DC
  Administered 2013-07-19 – 2013-07-24 (×9): 30 mg/h via INTRAVENOUS
  Filled 2013-07-19 (×20): qty 200

## 2013-07-19 MED ORDER — SODIUM CHLORIDE 0.9 % IV SOLN
INTRAVENOUS | Status: DC
  Administered 2013-07-19: 14:00:00 via INTRAVENOUS
  Administered 2013-07-19: 10 mL/h via INTRAVENOUS
  Administered 2013-07-21: 15:00:00 via INTRAVENOUS

## 2013-07-19 MED ORDER — SODIUM CHLORIDE 0.9 % IV SOLN
INTRAVENOUS | Status: DC
  Administered 2013-07-19: 14.7 [IU]/h via INTRAVENOUS
  Administered 2013-07-19: 22.8 [IU]/h via INTRAVENOUS
  Administered 2013-07-19: 1.8 [IU]/h via INTRAVENOUS
  Administered 2013-07-20: 04:00:00 via INTRAVENOUS
  Administered 2013-07-20: 3 [IU]/h via INTRAVENOUS
  Filled 2013-07-19 (×5): qty 1

## 2013-07-19 MED ORDER — SODIUM BICARBONATE 8.4 % IV SOLN
INTRAVENOUS | Status: AC
  Filled 2013-07-19: qty 50

## 2013-07-19 MED ORDER — HEPARIN SODIUM (PORCINE) 5000 UNIT/ML IJ SOLN
5000.0000 [IU] | Freq: Three times a day (TID) | INTRAMUSCULAR | Status: DC
  Administered 2013-07-19 – 2013-07-24 (×15): 5000 [IU] via SUBCUTANEOUS
  Filled 2013-07-19 (×19): qty 1

## 2013-07-19 MED ORDER — ARTIFICIAL TEARS OP OINT
1.0000 "application " | TOPICAL_OINTMENT | Freq: Three times a day (TID) | OPHTHALMIC | Status: DC
  Administered 2013-07-19 – 2013-07-20 (×5): 1 via OPHTHALMIC

## 2013-07-19 MED ORDER — SODIUM CHLORIDE 0.9 % IV SOLN
1.0000 mg/h | INTRAVENOUS | Status: DC
  Administered 2013-07-19: 4 mg/h via INTRAVENOUS
  Administered 2013-07-19: 2 mg/h via INTRAVENOUS
  Administered 2013-07-20 (×2): 4 mg/h via INTRAVENOUS
  Filled 2013-07-19 (×4): qty 10

## 2013-07-19 MED ORDER — MORPHINE SULFATE 4 MG/ML IJ SOLN
INTRAMUSCULAR | Status: AC
  Filled 2013-07-19: qty 1

## 2013-07-19 MED ORDER — ASPIRIN EC 81 MG PO TBEC: 81.0000 mg | DELAYED_RELEASE_TABLET | Freq: Every day | ORAL | Status: DC

## 2013-07-19 MED ORDER — SODIUM CHLORIDE 0.9 % IV BOLUS (SEPSIS)
500.0000 mL | Freq: Once | INTRAVENOUS | Status: AC
  Administered 2013-07-19: 500 mL via INTRAVENOUS

## 2013-07-19 MED ORDER — MIDAZOLAM HCL 2 MG/2ML IJ SOLN
INTRAMUSCULAR | Status: AC
  Filled 2013-07-19: qty 4

## 2013-07-19 MED ORDER — SODIUM CHLORIDE 0.9 % IJ SOLN
3.0000 mL | Freq: Two times a day (BID) | INTRAMUSCULAR | Status: DC
  Administered 2013-07-19 – 2013-07-23 (×10): 3 mL via INTRAVENOUS

## 2013-07-19 MED ORDER — CARVEDILOL 3.125 MG PO TABS
3.1250 mg | ORAL_TABLET | Freq: Two times a day (BID) | ORAL | Status: DC
  Administered 2013-07-21: 3.125 mg
  Filled 2013-07-19 (×2): qty 1

## 2013-07-19 MED ORDER — SODIUM CHLORIDE 0.9 % IV SOLN
INTRAVENOUS | Status: DC
  Administered 2013-07-19 – 2013-07-21 (×3): via INTRAVENOUS

## 2013-07-19 MED ORDER — SODIUM CHLORIDE 0.9 % IV SOLN
INTRAVENOUS | Status: AC
  Administered 2013-07-19: 03:00:00 via INTRAVENOUS

## 2013-07-19 MED ORDER — MIDAZOLAM HCL 2 MG/2ML IJ SOLN
4.0000 mg | Freq: Once | INTRAMUSCULAR | Status: AC
  Administered 2013-07-19: 4 mg via INTRAVENOUS

## 2013-07-19 MED ORDER — PANTOPRAZOLE SODIUM 40 MG PO TBEC: 40.0000 mg | DELAYED_RELEASE_TABLET | Freq: Every day | ORAL | Status: DC

## 2013-07-19 MED ORDER — "THROMBI-PAD 3""X3"" EX PADS"
1.0000 | MEDICATED_PAD | Freq: Once | CUTANEOUS | Status: AC
  Administered 2013-07-19: 1 via TOPICAL
  Filled 2013-07-19: qty 1

## 2013-07-19 MED ORDER — VERAPAMIL HCL 2.5 MG/ML IV SOLN
INTRAVENOUS | Status: AC
  Filled 2013-07-19: qty 2

## 2013-07-19 MED ORDER — ACETAMINOPHEN 325 MG PO TABS: 650.0000 mg | ORAL_TABLET | ORAL | Status: DC | PRN

## 2013-07-19 NOTE — Significant Event (Signed)
Pt with profound hypokalemia.  Will check ABG and Mg.  Will give KCL runs IV.  Coralyn Helling, MD 07/19/2013, 4:52 PM

## 2013-07-19 NOTE — Progress Notes (Signed)
77 year old abnormal history of hypertension and diabetes admitted roughly 31 PM on 12/7 with anterior STEMI status post cardiac arrest. He is to the Cath Lab where he had an occluded LAD treated with a drug-eluting stent. In addition he had distal LAD dissection treated with balloon angioplasty. Upon arrival to the CCU was actually hypertensive requiring hydralazine and nitroglycerin infusion. However following induction of sedation with Nimbex, his blood pressures in.below the target goal for our. He Was Started on Levophed.  Subjective:  Remains intubated and sedated. Initially this morning pressures were relatively stable, however he has had several episodes of profound hypotension. His CVP was 3 and was given IV fluid bolus. Due to hypotension Levophed was increased to 50 mcg per hour and dopamine his was initiated. Shortly after dopamine was started he developed rapid sinus tachycardia with PACs and short runs of SVT.  Objective:  Vital Signs in the last 24 hours: Temp:  [89.6 F (32 C)-94.8 F (34.9 C)] 91.3 F (32.9 C) (12/08 1300) Pulse Rate:  [32-120] 84 (12/08 1345) Resp:  [11-30] 30 (12/08 1345) BP: (78-168)/(39-90) 98/43 mmHg (12/08 1055) SpO2:  [86 %-100 %] 100 % (12/08 1345) Arterial Line BP: (80-193)/(47-82) 149/76 mmHg (12/08 1345) FiO2 (%):  [60 %-100 %] 60 % (12/08 1237) Weight:  [231 lb 7.7 oz (105 kg)] 231 lb 7.7 oz (105 kg) (12/08 0300)  Intake/Output from previous day: 12/07 0701 - 12/08 0700 In: 898.4 [I.V.:798.4; IV Piggyback:100] Out: 2100 [Urine:2100] Intake/Output from this shift: Total I/O In: 1852.1 [I.V.:1042.1; NG/GT:60; IV Piggyback:750] Out: 1500 [Urine:1500]  Physical Exam: General appearance: Intubated, sedated. Does not appear to be in acute distress. Neck: no adenopathy, no carotid bruit, no JVD, supple, symmetrical, trachea midline and thyroid not enlarged, symmetric, no tenderness/mass/nodules Lungs: clear to auscultation bilaterally and  Anterior lung fields Heart: On my evaluation he was more like tachycardic with frequent ectopy. Normal S1 and S2 with a soft S4 gallop. No obvious M./R./G. Abdomen: soft, non-tender; bowel sounds normal; no masses,  no organomegaly and Protuberant Extremities: extremities normal, atraumatic, no cyanosis or edema Pulses: 2+ and symmetric Neurologic: Mental status:  Deep sedation  Lab Results:  Recent Labs  07/23/2013 2328 07/15/2013 2334 07/19/13 0430  WBC 16.1*  --  13.0*  HGB 12.4* 13.9 10.8*  PLT 244  --  254    Recent Labs  July 25, 2013 2328 08/01/2013 2334 07/19/13 0430  NA 132* 138 134*  K 3.4* 3.5 2.3*  CL 98 102 102  CO2 18*  --  18*  GLUCOSE 290* 290* 217*  BUN 10 12 8   CREATININE 0.75 0.90 0.53    Recent Labs  07/19/13 0430 07/19/13 1230  TROPONINI 16.05* 13.70*   Hepatic Function Panel No results found for this basename: PROT, ALBUMIN, AST, ALT, ALKPHOS, BILITOT, BILIDIR, IBILI,  in the last 72 hours  Recent Labs  07/19/13 0430  CHOL 134   No results found for this basename: PROTIME,  in the last 72 hours  Imaging: Reviewed  Cardiac Studies: See cath report  Assessment/Plan:  Principal Problem:   ST elevation myocardial infarction (STEMI) of anterior wall, initial episode of care Active Problems:   Cardiac arrest - In setting of Anterior STEMI   Acute respiratory failure   Presence of drug coated stent in LAD coronary artery: Xience Xpedition DES 2.5 mm x 23 m (2.75 mm)   CAD S/P percutaneous coronary angioplasty - Anterior STEMI, DES to mid LAD   HTN (hypertension)   Hypothyroidism  Hyperglycemia   Cardiogenic shock  He is now hypotensive, that would be consistent with shock. I do think it is somewhat compensated by his sedation. With CP of only 3 weeks and sheet proceed his a size 10 international unit well, increase fluid boluses will be used. I would not use dopamine as he presented with ventricular fibrillation. I agree with loading with  amiodarone, as his blood pressure did not tolerate beta blocker. Would switch to Neo-Synephrine in addition to Levophed as second option. Hopefully we will discontinue 1 of these agents as his rate control becomes improved.  Troponin has not risen dramatically. Other labs. Relatively stable  For now he remains critically ill, his received his left dose as well as his first name he knows. We have discontinued Aggrastat infusion. He is also on statin as aspirin. ECG showed dramatic improvement in this afternoon versus even this morning post PCI.  He has been persistently hypokalemic requiring significant runs of potassium monitored by [facility] M. Appreciate [facility] M. assistance with hypothermia protocol    The patient is critically ill with multiple system failure. His balance shock it appears to be partially cardiogenic in nature. He requires it decision making involving vasopressors and anticoagulation regimen.  A total of 40 additional minutes of critical care time was compiled over the course of the late morning and early.   LOS: 1 day    Brice Potteiger W 07/19/2013, 2:23 PM

## 2013-07-19 NOTE — Interval H&P Note (Signed)
History and Physical Interval Note:  07/19/2013 2:35 AM  [name]  has presented today for surgery, with the diagnosis of Anterior STEMI  The various methods of treatment have been discussed with the patient and family. After consideration of risks, benefits and other options for treatment, the patient has consented to  Procedure(s): LEFT HEART CATHETERIZATION WITH CORONARY ANGIOGRAM (N/A) + PCI as a surgical intervention .  The patient's history has been reviewed, patient examined, no change in status, stable for surgery.  I have reviewed the patient's chart and labs.  Questions were answered to the patient's satisfaction.     [name]  Cath Lab Visit (complete for each Cath Lab visit)  Clinical Evaluation Leading to the Procedure:   ACS: yes  Non-ACS:    Anginal Classification: CCS IV  Anti-ischemic medical therapy: Minimal Therapy (1 class of medications)  Non-Invasive Test Results: No non-invasive testing performed  Prior CABG: No previous CABG

## 2013-07-19 NOTE — Progress Notes (Signed)
DR Molli Knock and Dr Herbie Baltimore notified of increased [facility] and decreasing blood pressure. Levo and Neo titrated. Dopamine stopped. Amiodarone bolus and infusion started. Echo being performed at this time. 12 lead EKG obtained. Family notified via telephone. Will continue to monitor.

## 2013-07-19 NOTE — Procedures (Signed)
Central Venous Catheter Insertion Procedure Note [name] [id number] [birth year]  Procedure: Insertion of Central Venous Catheter Indications: Assessment of intravascular volume, Drug and/or fluid administration and Frequent blood sampling  Procedure Details Consent: Risks of procedure as well as the alternatives and risks of each were explained to the (patient/caregiver).  Consent for procedure obtained. Time Out: Verified patient identification, verified procedure, site/side was marked, verified correct patient position, special equipment/implants available, medications/allergies/relevent history reviewed, required imaging and test results available.  Performed  Maximum sterile technique was used including antiseptics, cap, gloves, gown, hand hygiene, mask and sheet. Skin prep: Chlorhexidine; local anesthetic administered A antimicrobial bonded/coated triple lumen catheter was placed in the right internal jugular vein using the Seldinger technique.  Evaluation Blood flow good Complications: No apparent complications Patient did tolerate procedure well. Chest X-ray ordered to verify placement.  CXR: pending.  Lycia Sachdeva R. 07/19/2013, 4:08 AM  I used ultrasound to locate and access the vein/artery.

## 2013-07-19 NOTE — ED Notes (Signed)
Unable to obtain accurate Temperature on patient.

## 2013-07-19 NOTE — Progress Notes (Signed)
Echocardiogram 2D Echocardiogram has been performed.  [name] 07/19/2013, 11:47 AM

## 2013-07-19 NOTE — Progress Notes (Signed)
Critical Care  I was present in the ER during the initial patient evaluation.  Following intubation, I checked ABGs in the cath lab during the Cath-PCI procedure and made several Ventilator adjustments - increasing rate & tidal volumes.  I also administered IV Bicarbonate. 3 ABGs were check during the cath-PCI.  Upon arrival to the CCU, I returned to check in on the patient.  Post initiation of [employer], Pt noted to have severe HTN ~> 200 mmHg SBP.    Sedation adjusted.   IV NTG drip with PRN Hydralazine ordered.  Total CCM time from cath & post-cath ~45 min.   Marykay Lex, M.D., M.S. [facility] [address] [place], [place]  [address]  [phone] Pager # [phone] 07/19/2013 5:22 AM

## 2013-07-19 NOTE — Significant Event (Signed)
ABG    Component Value Date/Time   PHART 7.404 07/19/2013 0258   PCO2ART 33.9* 07/19/2013 0258   PO2ART 113.0* 07/19/2013 0258   HCO3 20.7 07/19/2013 0258   TCO2 21.8 07/19/2013 0258   ACIDBASEDEF 3.2* 07/19/2013 0258   O2SAT 98.5 07/19/2013 0258    Will add HCO3 to IV fluid.  Coralyn Helling, MD 07/19/2013, 5:06 PM

## 2013-07-19 NOTE — Procedures (Signed)
Arterial Catheter Insertion Procedure Note [name] [id number] [birth year]  Procedure: Insertion of Arterial Catheter  Indications: Blood pressure monitoring and Frequent blood sampling  Procedure Details Consent: Unable to obtain consent because of altered level of consciousness. Time Out: Verified patient identification, verified procedure, site/side was marked, verified correct patient position, special equipment/implants available, medications/allergies/relevent history reviewed, required imaging and test results available.  Performed  Maximum sterile technique was used including antiseptics, cap, gloves, hand hygiene, mask and sheet. Skin prep: Chlorhexidine; local anesthetic administered 20 gauge catheter was inserted into left radial artery using the Seldinger technique.  Evaluation Blood flow good; BP tracing good. Complications: No apparent complications. Normal saline flush used, site dressed and line secured per department protocol.  Live leveled and zeroed.  Donnelly Angelica 07/19/2013

## 2013-07-19 NOTE — Progress Notes (Signed)
INITIAL NUTRITION ASSESSMENT  DOCUMENTATION CODES Per approved criteria  -Obesity Unspecified   INTERVENTION: - If pt unable to be extubated once arctic protocol is complete, recommend Initiating Vital HP @ 20 ml/hr via NG tube and increase by 10 ml every 6 hours to goal rate of 75 ml/hr. At goal rate, tube feeding regimen will provide 1800 kcal, 158 grams of protein, and 1505 ml of H2O plus IV fluids. Regimen will meet 100% of estimated energy needs and 99% of estimated protein needs.  Enteral nutrition to provide 60-70% of estimated calorie needs (22-25 kcals/kg ideal body weight) and 100% of estimated protein needs, based on ASPEN guidelines for permissive underfeeding in critically ill obese individuals.   NUTRITION DIAGNOSIS: Inadequate oral intake related to inability to eat as evidenced by NPO status.   Goal: Pt to meet >/= 90% of their estimated nutrition needs   Monitor:  Wt, po intake, labs, NPO status, Intubation status  Reason for Assessment: Vent/MST  77 y.o. male  Admitting Dx: ST elevation myocardial infarction (STEMI) of anterior wall, initial episode of care  ASSESSMENT: 77 yo man with PMH of hypertension, hypothyroidism with recent October hospitalization with laminectomy in lumbar region who was BIBA after cardiac arrest at home. His wife left their living and came back and found her husband unresponsive leading her to call EMS. On arrival, [name] had no pulse, CPR started, initial rhythm VT, shocked x1, then asystole with CPR 1-2 minutes, next rhythm shockable and returned to tachycardia. Code STEMI called and cath lab activated. [name] intubated in the ER.  Pt was admitted to CCU for standard post radial catheterization care and [facility] Protocol. If pt is unable to be extubated once [facility] Protocol is complete, recommend initiation of tube feeding.   Height: Ht Readings from Last 1 Encounters:  07/19/13 6' 1.62" (1.87 m)    Weight: Wt Readings  from Last 1 Encounters:  07/19/13 231 lb 7.7 oz (105 kg)    Ideal Body Weight: 81.3 kg  % Ideal Body Weight: 129%  Wt Readings from Last 10 Encounters:  07/19/13 231 lb 7.7 oz (105 kg)  07/19/13 231 lb 7.7 oz (105 kg)  05/29/13 232 lb 9.6 oz (105.507 kg)  05/10/13 243 lb (110.224 kg)  12/02/11 210 lb (95.255 kg)    Usual Body Weight: unkown  % Usual Body Weight: unknown  BMI:  Body mass index is 30.03 kg/(m^2).  Estimated Nutritional Needs: Kcal: 1800-2000  Protein: 160 g protein Fluid: 1.8-2.0  Enteral nutrition to provide 60-70% of estimated calorie needs (22-25 kcals/kg ideal body weight) and 100% of estimated protein needs, based on ASPEN guidelines for permissive underfeeding in critically ill obese individuals.   Skin: WNL  Diet Order: NPO  EDUCATION NEEDS: -Education not appropriate at this time   Intake/Output Summary (Last 24 hours) at 07/19/13 0936 Last data filed at 07/19/13 0900  Gross per 24 hour  Intake 1062.8 ml  Output   2700 ml  Net -1637.2 ml    Last BM: none recorded   Labs:   Recent Labs Lab Aug 16, 2013 2328 2013-08-16 2334 07/19/13 0430  NA 132* 138 134*  K 3.4* 3.5 2.3*  CL 98 102 102  CO2 18*  --  18*  BUN 10 12 8   CREATININE 0.75 0.90 0.53  CALCIUM 8.3*  --  7.3*  MG  --   --  1.7  GLUCOSE 290* 290* 217*    CBG (last 3)   Recent Labs  07/19/13 0625 07/19/13 0740 07/19/13 0838  GLUCAP 206* 245* 244*    Scheduled Meds: . antiseptic oral rinse  15 mL Mouth Rinse QID  . artificial tears  1 application Both Eyes Q8H  . artificial tears      . aspirin  81 mg Per Tube Daily  . atorvastatin  80 mg Per Tube q1800  . carvedilol  3.125 mg Per Tube BID WC  . chlorhexidine  15 mL Mouth Rinse BID  . heparin  5,000 Units Subcutaneous Q8H  . hydrALAZINE      . levothyroxine  50 mcg Oral Custom  . [START ON 07/21/2013] levothyroxine  75 mcg Oral Custom  . midazolam      . morphine      . pantoprazole (PROTONIX) IV  40 mg  Intravenous Q24H  . potassium chloride  10 mEq Intravenous Q1 Hr x 5  . potassium chloride      . potassium chloride      . sodium chloride  3 mL Intravenous Q12H  . Ticagrelor  90 mg Oral BID    Continuous Infusions: . sodium chloride 100 mL/hr at 07/19/13 0230  . cisatracurium (NIMBEX) infusion 1 mcg/kg/min (07/19/13 0416)  . fentaNYL infusion INTRAVENOUS 100 mcg/hr (07/19/13 0500)  . insulin (NOVOLIN-R) infusion 1.8 Units/hr (07/19/13 0840)  . midazolam (VERSED) infusion 4 mg/hr (07/19/13 0520)  . nitroGLYCERIN    . norepinephrine (LEVOPHED) Adult infusion    . tirofiban      Past Medical History  Diagnosis Date  . Prostate cancer     hx of prostate ca  . Hypertension   . Thyroid disease   . Hypothyroidism   . Cough     due to scarring of lung  . GERD (gastroesophageal reflux disease)   . Arthritis   . Complication of anesthesia     sometimes have trouble intubating not last 2 times; limited neck mobility--glidescope used in '08 and '10    Past Surgical History  Procedure Laterality Date  . Lung surgery Right 09    growth on outside of lung  . Tonsillectomy    . Appendectomy    . Shoulder surgery Left     rotator cuff x2  . Back surgery    . Cervical spine surgery    . Knee arthroscopy Right   . Eye surgery Bilateral 10    Ebbie Latus RD, LDN

## 2013-07-19 NOTE — Progress Notes (Signed)
MDs Max Fickle and Adam belanger made aware that head Ct was not done.

## 2013-07-19 NOTE — Progress Notes (Signed)
eLink Physician-Brief Progress Note Patient Name: [name] DOB: 07/16/1936 MRN: [id number]  Date of Service  07/19/2013   HPI/Events of Note     eICU Interventions  Hypokalemia, repleted    Intervention Category Minor Interventions: Electrolytes abnormality - evaluation and management  MCQUAID, DOUGLAS 07/19/2013, 4:48 AM

## 2013-07-19 NOTE — CV Procedure (Signed)
CARDIAC CATHETERIZATION AND PERCUTANEOUS COROANRY INTERVENTION REPORT  NAME:  [name]   MRN: [id number] DOB:  04-12-1936   ADMIT DATE: 07/15/2013 Procedure Date: 07/19/2013  INTERVENTIONAL CARDIOLOGIST: Marykay Lex, M.D., MS PRIMARY CARE PROVIDER: Darnelle Bos, MD PRIMARY CARDIOLOGIST: Marykay Lex, MD,MS  PATIENT:  [name] is a 77 y.o. male with HTN & hypothyroidism, s/p recent Lumbar laminectomy. His wife reports that he has been c/o "heartburn" off & on all day. He had another episode & began to "shake" and slumped over in front of his wife.  She called 911.  ~10 min --> Fire Dept arrived & started CPR ~2 min.  EMS arrived & placed Defibrillator Pads --> VF --> Shock x 1 --> Asystole -> CPR --> VF,Shock --> perfusing rhythm with likely Anterior ST elevation.  Code STEMI called by EMS.  Field intubation with [facility] Airway, in ER,  ETT placed with Glide-Scope with difficulty. Due to witnessed arrest & Return of perfusing rhythm in setting of Anterior STEMI, he is taken directly to the Cardiac Cath Lab for emergent PCI.  PRE-OPERATIVE DIAGNOSIS:    ANTERIOR STEMI  VENTRICULAR FIBRILLATION CARDIAC ARREST  PROCEDURES PERFORMED:    LEFT HEART CATHETERIZATION WITH CORONARY ANGIOGRAPHY  DIFFICULT PERCUTANEOUS CORONARY INTERVENTION OF 100% THROMBOTIC OCCLUDED MID LAD - XIENCE XPEDITION DES 2.5 MM X 23 MM (2.75 MM)  PTCA OF THE DISTAL LAD BEYOND THE STENT   PROCEDURE:Consent: Unable to obtain consent because of altered level of consciousness.  Intubated & sedated, Emergency Consent Implied.  PROCEDURE: The patient was brought to the 2nd [facility] in the fasting state and prepped and draped in the usual sterile fashion for Right groin or radial access. A modified Allen's test with plethysmography was performed, revealing excellent Ulnar artery collateral flow.  Sterile technique was used including antiseptics, cap, gloves, gown,  hand hygiene, mask and sheet.  Skin prep: Chlorhexidine.  Time Out: Verified patient identification, verified procedure, site/side was marked, verified correct patient position, special equipment/implants available, medications/allergies/relevent history reviewed, required imaging and test results available.  Performed  Access: Right Radial Artery; 6 Fr Sheath -- Seldinger technique (Angiocath Micropuncture Kit)  IA Radial Cocktail - 10 ML, IV Heparin 6000 Units Diagnostic:  5 Fr TIG 4.0, 6 Fr XB LAD 3.5 Guide, 5 FrAngled Pigtail advanced & exchanged over [employer] J wire.  Left & Right Coronary Artery Angiography: TIG 4.0  PCI Below: XB LAD 3.5  LV Hemodynamics: angled Pigtail  TR Band:  0230 Hours, 18 mL air  MEDICATIONS:  Anesthesia:  Local Lidocaine 2 ml  Sedation:  2 mg IV Versed, 50 mcg IV fentanyl ;   Omnipaque Contrast: 420 ml  Anticoagulation:  IV Heparin 6000 Units bolus with 2 additional 2000 Unit boluses to keep ACT> ~250 sec  Anti-Platelet Agent:  Aggrestat Infusion @ standard rate  Bicarbonate 1 Amp IV (for ABG with pH 7.19, HCO3- of 17)  Normal Saline Bolus ~250-500 ml during procedure.  Ventilator rate increased to 30.  Hemodynamics:  Central Aortic / Mean Pressures: 114/59 mmHg; 81 mmHg  Left Ventricular Pressures / EDP: 116/5 mmHg; 10 mmHg  Left Ventriculography: Not performed in order to conserve contrast.  Coronary Anatomy:  Left Main: Large caliber vessel, mildly calcified. Distal ~30% stenosis as it bifurcates into the LAD & Circumflex. LAD: Large caliber vessel with ostial ~30% & proximal ~20-30% stenosis.  There is a early mid Diagonal 1 (D1) and several septal perforators.  After D1,  there is a tubular ~30-50% lesion followed by a short "normal segment.  Then just prior to a small D2, there is an eccentric ~60% lesion followed by a 100% thrombotic occlusion.  D1: Small to moderate caliber vessel that gives off a proximal branch.   Just beyond the branch there is a focal ~30-40% lesion.  D2: Small caliber vessel just prior to the 100% LAD occlusion, ostial ~50-60% stenosis (~1.5 mm - 2mm vessel) Post Angioplasty: Residual ~90% focal lesion at previous 100% occlusion site.    Downstream LAD flow revealed a sharp curve followed by a bifurcation into D3 and the terminal LAD.  There appears to be a short segment of potential dissection just in the "bend" segment.    D3 is small caliber & diffusely diseased  The Distal LAD beyond the "dissection" segment has diffuse mild disease. It wraps the infero-apex.  Left Circumflex: Very large caliber, co-dominant vessel with a small early marginal branch before it bifurcates into a Large Lateral OM and the AV Groove Circumflex.  There is ~30% Ostial Circumflex stenosis.  Lateral OM: Large caliber vessel that gives off a proximal "OM1" before bifurcating distally along the inferolateral wall into "OM2 & 3"  AV Groove Circumflex: Moderate to large caliber vessel that essentially trifurcates into 3 small to moderate caliber Left Posterolateral Branches.   RCA: Large caliber, co-dominant vessel that takes a downward "take-off" followed by a sharp "[place]" bend.  There is a ~30%, "hazy" defect in the initial upward bend to the [employer].  The mid vessel has minimal disease, but there is an eccentric, tubular ~50-60% stenosis in the distal vessel before it terminates as the Right Posterior Descending Artery.  There does not appear to be a  Right Posterior AV Groove Branch (RPAV), only a small branch that leads to the AV-nodal artery.  RPDA: Moderate caliber vessel that reaches the apex.  Mild luminal irregularities.  The Clear Culprit Lesion is the 100% mid LAD occlusion.  Preparations were made to perform PCI. Additional Heparin boluses of ~2000 Units were administered to ensure an ACT of ~250-300 sec. Due to the inability to give oral medications (Brilinta), Aggrestat  bolus was administered & infusion initiated.   Percutaneous Coronary Intervention:   Mid LAD 100% --> reduced to 0%; TIMI 0 flow pre-PCI --> TIMI 3 flow post PCI Guide: 6 Fr XBLAD 3.5  Guidewires:   BMW wire - advanced to the occlusion, and with balloon support, passed into the distal LAD, but would only turn into a septal perforator at the sharp bend, therefore, a second wire was used.  Whisper wire advanced to occlusion & into the distal LAD with balloon support-- Initial Balloon PTCA performed with balloon in place for wire advancement. Predilation Balloon: Sprinter Legend 2.5 mm x 15 mm;   8 Atm x 35 Sec - 2 inflations distal to proximal Stent: Xience Xpedition 2.5 mm x 23 mm; crosses D2  Deployment: 12 Atm x 45 Sec,   Post-dilation with Stent Balloon: 17 Atm x 50 Sec  Final Diameter: 2.75 mm  Post Stent angiography with improved distal flow revealed a short segment of potential dissection at the distal bend. --> this region was very difficult to advance a wire beyond & with persistent loss of guide support the balloon & wire would consistently pull back.  Wires were advanced with balloon support.  -- The Guide was redirected & reengaged the Left Main.  A Cougar wire was unable to be passed & a  second Whisper Wire was successfully advanced with balloon support.   Distal LAD ~Dissection vs. 80% stenosis.    Balloon: Sprinter Legend 1.5 mm x 12 mm;   8 Atm x 35 Sec, 10 Atm x 50 Sec - 2 inflations distal to proximal  Balloon: Sprinter Legend 2.0 mm x 12 mm;   6 Atm x 90 Sec - 2 inflations distal to proximal to just prior to D3 After both balloons, a Xience Xpedition DES 2.25 mm x 12 mm was unable to be passed beyond the distal bend.    Post deployment angiography in multiple views, with and without guidewire in place revealed excellent stent deployment and lesion coverage.  There was no evidence of dissection or perforation.  Intermittent ABGs were checked on I-STAT and  Ventilator rate adjustments were made for Acedosis.  1 Amp Bicarb was given.    PATIENT DISPOSITION:    The patient was transferred to the PACU holding area in a hemodynamicaly stable, chest pain free condition.  The patient tolerated the procedure well, and there were no complications.  EBL:   < 20 ml  The patient was stable before, during, and after the procedure.  POST-OPERATIVE DIAGNOSIS:    Anterior STEMI with distal Mid LAD 100% thrombotic Occlusion followed by a short segment of possible dissection.  Successful PCI of occlusion with a Single Xience Xpedition DES 2.5 mm x 23 mm, post-dilated to 2.75 mm  Balloon Angioplasty with resolution of distal LAD dissection.  Diffuse mild to moderate CAD in the remaining coronary vasculature.  Essential normal LVEDP.  Status Post (V Fib) Cardiac Arrest due to Anterior STEMI  PLAN OF CARE:  Admit to CCU for standard post radial catheterization care & [place] Protocol  Will need to give Brilinta & ASA via OG tube once placed  PCCM to assist with Ventilation management & [employer].  Will need to monitor for signs of Aspiration.  Add BB (Carvedilol) & Statin.   Marykay Lex, M.D., M.S. [facility] [address] [place], [place]  [address]  [phone]  07/19/2013 3:40 AM

## 2013-07-19 NOTE — CV Procedure (Deleted)
       Marykay Lex, M.D., M.S. [facility] [address] [place], [place]  [address]  [phone]  07/19/2013 3:40 AM

## 2013-07-19 NOTE — Progress Notes (Signed)
PULMONARY  / CRITICAL CARE MEDICINE CONSULTATION  Name: [name] MRN: [id number] DOB: 1935/11/23    ADMISSION DATE:  08/02/2013  CHIEF COMPLAINT:  Cardiac arrest  BRIEF PATIENT DESCRIPTION: 77 year-old male presenting the following cardiac arrest in the setting of STEMI. Pulmonary critical care medicine consult for ventilator management.  SIGNIFICANT EVENTS / STUDIES:  1. Cardiac arrest 07/23/2013 2. Intubated 07/13/2013 3. Cath 07/19/2013 - Preliminary report intervention done on LAD  LINES / TUBES: 1. RIJ CVC 12/8>>> 2. ET Tube 12/7>>> 3. R radial a-line 12/8>>>  CULTURES: 1. None  ANTIBIOTICS: 1. None  PHYSICAL EXAM  VITAL SIGNS: Temp:  [89.6 F (32 C)-94.8 F (34.9 C)] 91.4 F (33 C) (12/08 1000) Pulse Rate:  [52-120] 76 (12/08 1000) Resp:  [14-30] 27 (12/08 0900) BP: (95-168)/(59-90) 132/73 mmHg (12/08 0900) SpO2:  [86 %-100 %] 98 % (12/08 1000) Arterial Line BP: (109-193)/(52-82) 151/65 mmHg (12/08 1000) FiO2 (%):  [60 %-100 %] 60 % (12/08 0743) Weight:  [105 kg (231 lb 7.7 oz)] 105 kg (231 lb 7.7 oz) (12/08 0300)  HEMODYNAMICS: CVP:  [4 mmHg] 4 mmHg  VENTILATOR SETTINGS: Vent Mode:  [-] PRVC FiO2 (%):  [60 %-100 %] 60 % Set Rate:  [14 bmp-30 bmp] 30 bmp Vt Set:  [580 mL] 580 mL PEEP:  [5 cmH20] 5 cmH20 Plateau Pressure:  [17 cmH20-29 cmH20] 29 cmH20  INTAKE / OUTPUT: Intake/Output     12/07 0701 - 12/08 0700 12/08 0701 - 12/09 0700   I.V. (mL/kg) 798.4 (7.6) 155.9 (1.5)   NG/GT  60   IV Piggyback 100 600   Total Intake(mL/kg) 898.4 (8.6) 815.9 (7.8)   Urine (mL/kg/hr) 2100 600 (1.7)   Total Output 2100 600   Net -1201.6 +215.9          PHYSICAL EXAMINATION: General:  Elderly M in NAD Neuro:  Sedated and paralyzed HEENT:  Sclera anicteric, conjunctiva pink, MMM, ETT present Neck:  Supple and trachea midline, (-) LAN or JVD Cardiovascular:  RRR, NS1/S2, (-) MRG Lungs: Coarse mechanical breath sounds bilaterally Abdomen:   S/NT/ND/(+)BS Musculoskeletal:  (-) C/C/E Skin:  Intact  LABS:  CBC Recent Labs     07/17/2013  2328  08/05/2013  2334  07/19/13  0430  WBC  16.1*   --   13.0*  HGB  12.4*  13.9  10.8*  HCT  38.0*  41.0  32.5*  PLT  244   --   254    Coag's Recent Labs     07/15/2013  2328  07/19/13  0430  APTT  29  123*  INR  1.19  1.27    BMET Recent Labs     07/30/2013  2328  08/06/2013  2334  07/19/13  0430  NA  132*  138  134*  K  3.4*  3.5  2.3*  CL  98  102  102  CO2  18*   --   18*  BUN  10  12  8   CREATININE  0.75  0.90  0.53  GLUCOSE  290*  290*  217*    Electrolytes Recent Labs     07/16/2013  2328  07/19/13  0430  CALCIUM  8.3*  7.3*  MG   --   1.7    Sepsis Markers No results found for this basename: LACTICACIDVEN, PROCALCITON, O2SATVEN,  in the last 72 hours  ABG Recent Labs     07/19/13  0258  PHART  7.404  PCO2ART  33.9*  PO2ART  113.0*    Liver Enzymes No results found for this basename: AST, ALT, ALKPHOS, BILITOT, ALBUMIN,  in the last 72 hours  Cardiac Enzymes Recent Labs     08/10/2013  2328  07/19/13  0430  TROPONINI  10.20*  16.05*  PROBNP  462.6*  552.4*    Glucose Recent Labs     07/19/13  0326  07/19/13  0452  07/19/13  0625  07/19/13  0740  07/19/13  0838  GLUCAP  217*  214*  206*  245*  244*    Imaging Dg Chest Port 1 View  07/19/2013   CLINICAL DATA:  Repositioning of endotracheal tube and placement of orogastric tube.  EXAM: PORTABLE CHEST - 1 VIEW  COMPARISON:  Chest radiograph performed earlier today at 3:48 a.m.  FINDINGS: The patient's endotracheal tube is seen ending 4 cm above the carina. A right IJ line is noted ending about the proximal to mid SVC. An enteric tube is noted extending below the diaphragm.  Lung expansion is slightly improved. Vascular congestion is seen, with bilateral perihilar airspace opacification, worse on the right. This may reflect pneumonia or possibly mild pulmonary edema. No pleural effusion or  pneumothorax is seen.  The cardiomediastinal silhouette is borderline normal in size. No acute osseous abnormalities are identified.  IMPRESSION: 1. Endotracheal tube seen ending 4 cm above the carina. 2. Orogastric tube noted extending below the diaphragm. 3. Vascular congestion, with bilateral perihilar airspace opacification, worse on the right. This may reflect pneumonia or possibly mild pulmonary edema.   Electronically Signed   By: Roanna Raider M.D.   On: 07/19/2013 04:17   Dg Chest Portable 1 View  07/19/2013   CLINICAL DATA:  Cardiac arrest.  EXAM: PORTABLE CHEST - 1 VIEW  COMPARISON:  Chest radiograph May 19, 2013  FINDINGS: The cardiac silhouette appears mildly enlarged, similar. Mildly calcified aortic knob. Increased central pulmonary vasculature congestion with right upper lobe and perihilar airspace opacities. No pleural effusions. Suspected pleural PAC and right lung base. No pneumothorax. Right internal jugular central venous catheter with distal tip projecting in proximal superior vena cava. Endotracheal tube tip projects 4.5 cm above the equina.  Multiple EKG lines overlie the patient and may obscure subtle underlying pathology. Soft tissue planes and included osseous structures are nonsuspicious.  IMPRESSION: Endotracheal tube and right internal jugular central venous catheter in apparent good position.  Mild cardiomegaly and central pulmonary vasculature congestion with perihilar/right upper lobe airspace opacities which may reflect confluent edema, less likely pneumonia.   Electronically Signed   By: Awilda Metro   On: 07/19/2013 04:06   CXR: Chest x-ray from time was personally reviewed by me. There are mild bilateral nonspecific interstitial markings which could represent edema or the sequela of aspiration. Endotracheal tube has been advanced in acceptable position. A right internal jugular central venous catheter is present.   ASSESSMENT / PLAN: Principal Problem:   ST  elevation myocardial infarction (STEMI) of anterior wall, initial episode of care Active Problems:   HTN (hypertension)   Hypothyroidism   Cardiac arrest - In setting of Anterior STEMI   Hyperglycemia   Acute respiratory failure   Presence of drug coated stent in LAD coronary artery: Xience Xpedition DES 2.5 mm x 23 m (2.75 mm)   CAD S/P percutaneous coronary angioplasty - Anterior STEMI, DES to mid LAD  1. Acute respiratory failure: This is most likely secondary to the patient's cardiac arrest. He may be developing ARDS based  on his chest x-ray versus mild pulmonary edema from his recent cardiac event. Currently, there there are no difficulties oxygenating or ventilating him.  Lung protective ventilation  VAP prevention  Vent adjusted for ABG.  No weaning til after warmed.  2. Cardiac Arrest: This is almost certainly secondary to the patient's acute ST segment elevation myocardial infarction. He is status post intervention.  Cooling protocol  Coronary artery disease/STEMI management per cardiology  Levophed as ordered.  3. Hyperglycemia: This may be a reflection of underlying diabetes versus the acute insult.  SSI  4. Hypothyrodisim  Check TSH  5. Renal: hypokalemia and hypomag, replace and recheck.  6. GI: NPO while paralyzed and continue SUP.  7. Heme: No active issues.  Daily CBC and transfusion per ICU protocol.  8. Neuro: 20 minutes downtime before ROSC.  Head CT once cooling is complete and hemodynamically stable.   Spoke with wife at length bedside.  Patient evidently has a living will that states he does not wish to be on prolonged life support.  They are awiating for our conclusion from a neurologic standpoint then will readdress.  Full code til then.  I have personally obtained a history, examined the patient, evaluated laboratory and imaging results, formulated the assessment and plan and placed orders.  CRITICAL CARE: The patient is critically ill with  multiple organ systems failure and requires high complexity decision making for assessment and support, frequent evaluation and titration of therapies, application of advanced monitoring technologies and extensive interpretation of multiple databases. Critical Care Time devoted to patient care services described in this note is 55 minutes.  Alyson Reedy, M.D. [facility] Pulmonary/Critical Care Medicine. Pager: [phone]. After hours pager: [phone].

## 2013-07-19 NOTE — Consult Note (Signed)
PULMONARY  / CRITICAL CARE MEDICINE CONSULTATION  Name: [name] MRN: [id number] DOB: 02-19-1936    ADMISSION DATE:  July 25, 2013  CHIEF COMPLAINT:  Cardiac arrest  BRIEF PATIENT DESCRIPTION: 77 year-old male presenting the following cardiac arrest in the setting of STEMI. Pulmonary critical care medicine consult for ventilator management.  SIGNIFICANT EVENTS / STUDIES:  1. Cardiac arrest 08/05/2013 2. Intubated 08/08/2013 3. Cath 07/19/2013 - Preliminary report intervention done on LAD  LINES / TUBES: 1. RIJ CVC 12/8 -  CULTURES: 1. None  ANTIBIOTICS: 1. None  HISTORY OF PRESENT ILLNESS:  [name] is a 76 year old male with multiple medical issues some of the highlights of which include prostate cancer and hypothyroidism who was transferred to [facility] today following a cardiac arrest in the field. The following history is obtained from a review of the chart as the patient is currently intubated and sedated. On arrival, initial rhythm apparently ventricular tachycardia. The patient was shocked once and then went into asystole. He received approximately 2 minutes of CPR and his repeat rhythm is shockable; he did develop return of spontaneous circulation.   PAST MEDICAL HISTORY :  Past Medical History  Diagnosis Date  . Prostate cancer     hx of prostate ca  . Hypertension   . Thyroid disease   . Hypothyroidism   . Cough     due to scarring of lung  . GERD (gastroesophageal reflux disease)   . Arthritis   . Complication of anesthesia     sometimes have trouble intubating not last 2 times; limited neck mobility--glidescope used in '08 and '10    Past Surgical History  Procedure Laterality Date  . Lung surgery Right 09    growth on outside of lung  . Tonsillectomy    . Appendectomy    . Shoulder surgery Left     rotator cuff x2  . Back surgery    . Cervical spine surgery    . Knee arthroscopy Right   . Eye surgery Bilateral 10    Prior to Admission  medications   Medication Sig Start Date End Date Taking? Authorizing Provider  acetaminophen (TYLENOL) 325 MG tablet Take 1-2 tablets (325-650 mg total) by mouth every 4 (four) hours as needed. 05/28/13   Evlyn Kanner Love, PA-C  amLODipine (NORVASC) 5 MG tablet Take 10 mg by mouth daily. Hold if SBP <100    Historical Provider, MD  benzonatate (TESSALON) 100 MG capsule Take 100 mg by mouth 2 (two) times daily.    Historical Provider, MD  ciprofloxacin (CIPRO) 500 MG tablet Take 1 tablet (500 mg total) by mouth 2 (two) times daily. 05/28/13   Jacquelynn Cree, PA-C  DULoxetine (CYMBALTA) 60 MG capsule Take 60 mg by mouth daily.    Historical Provider, MD  dutasteride (AVODART) 0.5 MG capsule Take 0.5 mg by mouth daily.    Historical Provider, MD  fluticasone (FLONASE) 50 MCG/ACT nasal spray Place 1 spray into the nose daily as needed for rhinitis. 05/28/13   Jacquelynn Cree, PA-C  guaiFENesin (MUCINEX) 600 MG 12 hr tablet Take 600 mg by mouth 2 (two) times daily.    Historical Provider, MD  levothyroxine (SYNTHROID, LEVOTHROID) 50 MCG tablet Take 50 mcg by mouth See admin instructions. Take on Mondays, Tuesdays, Wednesdays, Thursdays, and Fridays.    Historical Provider, MD  levothyroxine (SYNTHROID, LEVOTHROID) 75 MCG tablet Take 75 mcg by mouth See admin instructions. Take on Saturdays and Sundays only.    Historical  Provider, MD  LORazepam (ATIVAN) 1 MG tablet Take 0.5 tablets (0.5 mg total) by mouth 2 (two) times daily as needed for anxiety. 05/28/13   Evlyn Kanner Love, PA-C  metoprolol tartrate (LOPRESSOR) 25 MG tablet Take 25 mg by mouth 2 (two) times daily.    Historical Provider, MD  omeprazole (PRILOSEC) 20 MG capsule Take 20 mg by mouth 2 (two) times daily.    Historical Provider, MD  oxyCODONE 10 MG TABS Take 0.5-1 tablets (5-10 mg total) by mouth every 6 (six) hours as needed. For pain. 05/28/13   Jacquelynn Cree, PA-C  senna (SENOKOT) 8.6 MG TABS Take 2 tablets by mouth at bedtime.    Historical  Provider, MD  tamsulosin (FLOMAX) 0.4 MG CAPS capsule Take 1 capsule (0.4 mg total) by mouth daily. 05/28/13   Jacquelynn Cree, PA-C  Vitamin D, Ergocalciferol, (DRISDOL) 50000 UNITS CAPS Take 50,000 Units by mouth every 7 (seven) days. Patient tries to take on Mondays or Tuesdays.    Historical Provider, MD    No Known Allergies  FAMILY HISTORY:  No family history on file.  SOCIAL HISTORY:  reports that he quit smoking about 31 years ago. His smoking use included Cigarettes. He has a 45 pack-year smoking history. He does not have any smokeless tobacco history on file. He reports that he does not drink alcohol or use illicit drugs.  REVIEW OF SYSTEMS:  Unable to obtain secondary to patient condition.  PHYSICAL EXAM  VITAL SIGNS: Pulse Rate:  [82-120] 82 (12/07 2351) Resp:  [16-23] 16 (12/07 2335) BP: (101-146)/(70-90) 119/81 mmHg (12/07 2335) SpO2:  [86 %-99 %] 99 % (12/07 2335) FiO2 (%):  [60 %-100 %] 60 % (12/08 0250) Weight:  [231 lb 7.7 oz (105 kg)] 231 lb 7.7 oz (105 kg) (12/08 0300)  HEMODYNAMICS:    VENTILATOR SETTINGS: Vent Mode:  [-] PRVC FiO2 (%):  [60 %-100 %] 60 % Set Rate:  [14 bmp-30 bmp] 30 bmp Vt Set:  [580 mL] 580 mL PEEP:  [5 cmH20] 5 cmH20 Plateau Pressure:  [17 cmH20-22 cmH20] 17 cmH20  INTAKE / OUTPUT: Intake/Output   None     PHYSICAL EXAMINATION: General:  Elderly M in NAD Neuro:  Occasional Non-purposeful movement HEENT:  Sclera anicteric, conjunctiva pink, MMM, ETT present Neck:  Trachea supple and midline, (-) LAN or JVD Cardiovascular:  RRR, NS1/S2, (-) MRG Lungs:  Coarse mechanical breath sounds bilaterally Abdomen:  S/NT/ND/(+)BS Musculoskeletal:  (-) C/C/E Skin:  Intact  LABS:  CBC Recent Labs     07/28/2013  2328  08/01/2013  2334  WBC  16.1*   --   HGB  12.4*  13.9  HCT  38.0*  41.0  PLT  244   --     Coag's Recent Labs     08/01/2013  2328  APTT  29  INR  1.19    BMET Recent Labs     07/17/2013  2328  07/17/2013  2334   NA  132*  138  K  3.4*  3.5  CL  98  102  CO2  18*   --   BUN  10  12  CREATININE  0.75  0.90  GLUCOSE  290*  290*    Electrolytes Recent Labs     07/30/2013  2328  CALCIUM  8.3*    Sepsis Markers No results found for this basename: LACTICACIDVEN, PROCALCITON, O2SATVEN,  in the last 72 hours  ABG No results found for this basename: PHART,  PCO2ART, PO2ART,  in the last 72 hours  Liver Enzymes No results found for this basename: AST, ALT, ALKPHOS, BILITOT, ALBUMIN,  in the last 72 hours  Cardiac Enzymes Recent Labs     2013/08/15  2328  PROBNP  462.6*    Glucose No results found for this basename: GLUCAP,  in the last 72 hours  Imaging Dg Chest Portable 1 View  07/19/2013   CLINICAL DATA:  Cardiac arrest.  EXAM: PORTABLE CHEST - 1 VIEW  COMPARISON:  Chest radiograph May 19, 2013  FINDINGS: The cardiac silhouette appears mildly enlarged, similar. Mildly calcified aortic knob. Increased central pulmonary vasculature congestion with right upper lobe and perihilar airspace opacities. No pleural effusions. Suspected pleural PAC and right lung base. No pneumothorax. Right internal jugular central venous catheter with distal tip projecting in proximal superior vena cava. Endotracheal tube tip projects 4.5 cm above the equina.  Multiple EKG lines overlie the patient and may obscure subtle underlying pathology. Soft tissue planes and included osseous structures are nonsuspicious.  IMPRESSION: Endotracheal tube and right internal jugular central venous catheter in apparent good position.  Mild cardiomegaly and central pulmonary vasculature congestion with perihilar/right upper lobe airspace opacities which may reflect confluent edema, less likely pneumonia.   Electronically Signed   By: Awilda Metro   On: 07/19/2013 04:06   CXR: Chest x-ray from time was personally reviewed by me. There are mild bilateral nonspecific interstitial markings which could represent edema or the sequela of  aspiration. Endotracheal tube has been advanced in acceptable position. A right internal jugular central venous catheter is present.   ASSESSMENT / PLAN: Principal Problem:   STEMI (ST elevation myocardial infarction) Active Problems:   Cardiac arrest   Acute respiratory failure   Hyperglycemia   HTN (hypertension)   Hypothyroidism  1. Acute respiratory failure: This is most likely secondary to the patient's cardiac arrest. He may be developing ARDS based on his chest x-ray versus mild pulmonary edema from his recent cardiac event. Currently, there there are no difficulties oxygenating or ventilating him.  Lung protective ventilation  VAP prevention  SBT once off cooling protocol  2. Cardiac Arrest: This is almost certainly secondary to the patient's acute ST segment elevation myocardial infarction. He is status post intervention.  Cooling protocol  Coronary artery disease/STEMI management per cardiology  3. Hyperglycemia: This may be a reflection of underlying diabetes versus the acute insult.  SSI  4. Hypothyrodisim  Check TSH  I have personally obtained a history, examined the patient, evaluated laboratory and imaging results, formulated the assessment and plan and placed orders.  CRITICAL CARE: The patient is critically ill with multiple organ systems failure and requires high complexity decision making for assessment and support, frequent evaluation and titration of therapies, application of advanced monitoring technologies and extensive interpretation of multiple databases. Critical Care Time devoted to patient care services described in this note is 45 minutes.    Evalyn Casco, MD Pulmonary and Critical Care Medicine [facility] Pager: [phone]  07/19/2013, 4:16 AM

## 2013-07-20 ENCOUNTER — Inpatient Hospital Stay (HOSPITAL_COMMUNITY): Payer: Medicare Other

## 2013-07-20 DIAGNOSIS — R570 Cardiogenic shock: Secondary | ICD-10-CM

## 2013-07-20 DIAGNOSIS — I469 Cardiac arrest, cause unspecified: Secondary | ICD-10-CM

## 2013-07-20 DIAGNOSIS — J9600 Acute respiratory failure, unspecified whether with hypoxia or hypercapnia: Secondary | ICD-10-CM

## 2013-07-20 DIAGNOSIS — R4182 Altered mental status, unspecified: Secondary | ICD-10-CM

## 2013-07-20 LAB — BASIC METABOLIC PANEL
BUN: 7 mg/dL (ref 6–23)
BUN: 7 mg/dL (ref 6–23)
BUN: 8 mg/dL (ref 6–23)
BUN: 9 mg/dL (ref 6–23)
BUN: 10 mg/dL (ref 6–23)
CO2: 11 mEq/L — ABNORMAL LOW (ref 19–32)
CO2: 12 mEq/L — ABNORMAL LOW (ref 19–32)
CO2: 14 mEq/L — ABNORMAL LOW (ref 19–32)
CO2: 17 mEq/L — ABNORMAL LOW (ref 19–32)
CO2: 17 mEq/L — ABNORMAL LOW (ref 19–32)
CO2: 18 mEq/L — ABNORMAL LOW (ref 19–32)
Calcium: 8.4 mg/dL (ref 8.4–10.5)
Calcium: 8.4 mg/dL (ref 8.4–10.5)
Calcium: 8.1 mg/dL — ABNORMAL LOW (ref 8.4–10.5)
Calcium: 8.2 mg/dL — ABNORMAL LOW (ref 8.4–10.5)
Chloride: 103 mEq/L (ref 96–112)
Chloride: 102 mEq/L (ref 96–112)
Chloride: 104 mEq/L (ref 96–112)
Chloride: 105 mEq/L (ref 96–112)
Chloride: 102 mEq/L (ref 96–112)
Creatinine, Ser: 0.54 mg/dL (ref 0.50–1.35)
Creatinine, Ser: 0.53 mg/dL (ref 0.50–1.35)
Creatinine, Ser: 0.54 mg/dL (ref 0.50–1.35)
Creatinine, Ser: 0.68 mg/dL (ref 0.50–1.35)
Creatinine, Ser: 0.83 mg/dL (ref 0.50–1.35)
Creatinine, Ser: 1.07 mg/dL (ref 0.50–1.35)
GFR calc Af Amer: >90 mL/min (ref >90)
GFR calc Af Amer: >90 mL/min (ref >90)
GFR calc non Af Amer: >90 mL/min (ref >90)
GFR calc non Af Amer: 85 mL/min — ABNORMAL LOW (ref >90)
GFR calc non Af Amer: 66 mL/min — ABNORMAL LOW (ref >90)
Glucose, Bld: 88 mg/dL (ref 70–99)
Glucose, Bld: 124 mg/dL — ABNORMAL HIGH (ref 70–99)
Glucose, Bld: 138 mg/dL — ABNORMAL HIGH (ref 70–99)
Potassium: 2.8 mEq/L — ABNORMAL LOW (ref 3.5–5.1)
Sodium: 135 mEq/L (ref 135–145)
Sodium: 136 mEq/L (ref 135–145)
Sodium: 132 mEq/L — ABNORMAL LOW (ref 135–145)

## 2013-07-20 LAB — CBC
HCT: 35.6 % — ABNORMAL LOW (ref 39.0–52.0)
Hemoglobin: 12 g/dL — ABNORMAL LOW (ref 13.0–17.0)
MCV: 84.2 fL (ref 78.0–100.0)
RBC: 4.23 MIL/uL (ref 4.22–5.81)
RDW: 13.9 % (ref 11.5–15.5)
WBC: 12.1 10*3/uL — ABNORMAL HIGH (ref 4.0–10.5)

## 2013-07-20 LAB — GLUCOSE, CAPILLARY
Glucose-Capillary: 176 mg/dL — ABNORMAL HIGH (ref 70–99)
Glucose-Capillary: 200 mg/dL — ABNORMAL HIGH (ref 70–99)
Glucose-Capillary: 224 mg/dL — ABNORMAL HIGH (ref 70–99)
Glucose-Capillary: 206 mg/dL — ABNORMAL HIGH (ref 70–99)
Glucose-Capillary: 151 mg/dL — ABNORMAL HIGH (ref 70–99)
Glucose-Capillary: 180 mg/dL — ABNORMAL HIGH (ref 70–99)
Glucose-Capillary: 156 mg/dL — ABNORMAL HIGH (ref 70–99)
Glucose-Capillary: 111 mg/dL — ABNORMAL HIGH (ref 70–99)
Glucose-Capillary: 135 mg/dL — ABNORMAL HIGH (ref 70–99)
Glucose-Capillary: 71 mg/dL (ref 70–99)
Glucose-Capillary: 88 mg/dL (ref 70–99)
Glucose-Capillary: 88 mg/dL (ref 70–99)
Glucose-Capillary: 103 mg/dL — ABNORMAL HIGH (ref 70–99)
Glucose-Capillary: 103 mg/dL — ABNORMAL HIGH (ref 70–99)

## 2013-07-20 LAB — PHOSPHORUS: Phosphorus: 1.1 mg/dL — ABNORMAL LOW (ref 2.3–4.6)

## 2013-07-20 LAB — BLOOD GAS, ARTERIAL
Acid-base deficit: 11.9 mmol/L — ABNORMAL HIGH (ref 0.0–2.0)
Bicarbonate: 12.4 mEq/L — ABNORMAL LOW (ref 20.0–24.0)
Drawn by: 331761
FIO2: 0.4 %
MECHVT: 580 mL
O2 Saturation: 96.5 %
RATE: 30 resp/min
TCO2: 13.1 mmol/L (ref 0–100)
pCO2 arterial: 18 mmHg — CL (ref 35.0–45.0)

## 2013-07-20 LAB — MAGNESIUM: Magnesium: 1.8 mg/dL (ref 1.5–2.5)

## 2013-07-20 MED ORDER — POTASSIUM CHLORIDE 10 MEQ/50ML IV SOLN
INTRAVENOUS | Status: AC
  Filled 2013-07-20: qty 50

## 2013-07-20 MED ORDER — HYDROCORTISONE SOD SUCCINATE 100 MG IJ SOLR
50.0000 mg | Freq: Four times a day (QID) | INTRAMUSCULAR | Status: DC
  Administered 2013-07-20 – 2013-07-21 (×4): 50 mg via INTRAVENOUS
  Filled 2013-07-20 (×8): qty 1

## 2013-07-20 MED ORDER — POTASSIUM CHLORIDE 10 MEQ/50ML IV SOLN
10.0000 meq | INTRAVENOUS | Status: AC
  Administered 2013-07-20 (×5): 10 meq via INTRAVENOUS
  Filled 2013-07-20 (×6): qty 50

## 2013-07-20 MED ORDER — DEXTROSE 10 % IV SOLN
INTRAVENOUS | Status: DC | PRN
  Administered 2013-07-21 – 2013-07-24 (×4): via INTRAVENOUS

## 2013-07-20 MED ORDER — INSULIN GLARGINE 100 UNIT/ML ~~LOC~~ SOLN
30.0000 [IU] | SUBCUTANEOUS | Status: DC
  Administered 2013-07-20 – 2013-07-22 (×3): 30 [IU] via SUBCUTANEOUS
  Filled 2013-07-20 (×8): qty 0.3

## 2013-07-20 MED ORDER — POTASSIUM CHLORIDE 10 MEQ/50ML IV SOLN
10.0000 meq | INTRAVENOUS | Status: AC
  Administered 2013-07-20 (×2): 10 meq via INTRAVENOUS
  Filled 2013-07-20: qty 50

## 2013-07-20 MED ORDER — INSULIN ASPART 100 UNIT/ML ~~LOC~~ SOLN
2.0000 [IU] | SUBCUTANEOUS | Status: DC
  Administered 2013-07-20: 2 [IU] via SUBCUTANEOUS
  Administered 2013-07-22 (×5): 4 [IU] via SUBCUTANEOUS
  Administered 2013-07-23 (×2): 2 [IU] via SUBCUTANEOUS

## 2013-07-20 NOTE — Progress Notes (Signed)
eLink Physician-Brief Progress Note Patient Name: [name] DOB: 1936/03/21 MRN: [id number]  Date of Service  07/20/2013   HPI/Events of Note  Hypokalemia   eICU Interventions  Potassium replaced   Intervention Category Intermediate Interventions: Electrolyte abnormality - evaluation and management  DETERDING,ELIZABETH 07/20/2013, 1:33 AM

## 2013-07-20 NOTE — Progress Notes (Signed)
PULMONARY  / CRITICAL CARE MEDICINE CONSULTATION  Name: [name] MRN: [id number] DOB: [birth year]    ADMISSION DATE:  08/11/2013  CHIEF COMPLAINT:  Cardiac arrest  BRIEF PATIENT DESCRIPTION: 77 year-old male presenting the following cardiac arrest in the setting of STEMI. Pulmonary critical care medicine consult for ventilator management.  SIGNIFICANT EVENTS / STUDIES:  1. Cardiac arrest 08/07/2013 2. Intubated 07/17/2013 3. Cath 07/19/2013 - Preliminary report intervention done on LAD  LINES / TUBES: 1. RIJ CVC 12/8>>> 2. ET Tube 12/7>>> 3. R radial a-line 12/8>>>  CULTURES: 1. None  ANTIBIOTICS: 1. None  PHYSICAL EXAM  VITAL SIGNS: Temp:  [90.5 F (32.5 C)-92.5 F (33.6 C)] 92.5 F (33.6 C) (12/09 0900) Pulse Rate:  [32-120] 91 (12/09 0904) Resp:  [0-30] 24 (12/09 0904) BP: (78-151)/(39-73) 151/73 mmHg (12/09 0315) SpO2:  [95 %-100 %] 97 % (12/09 0904) Arterial Line BP: (80-172)/(47-82) 146/75 mmHg (12/09 0900) FiO2 (%):  [40 %-60 %] 40 % (12/09 0904) Weight:  [107.7 kg (237 lb 7 oz)] 107.7 kg (237 lb 7 oz) (12/09 0352)  HEMODYNAMICS: CVP:  [3 mmHg-5 mmHg] 5 mmHg  VENTILATOR SETTINGS: Vent Mode:  [-] PRVC FiO2 (%):  [40 %-60 %] 40 % Set Rate:  [24 bmp-30 bmp] 24 bmp Vt Set:  [580 mL] 580 mL PEEP:  [5 cmH20] 5 cmH20 Plateau Pressure:  [18 cmH20-24 cmH20] 20 cmH20  INTAKE / OUTPUT: Intake/Output     12/08 0701 - 12/09 0700 12/09 0701 - 12/10 0700   I.V. (mL/kg) 4727 (43.9) 447.5 (4.2)   NG/GT 60    IV Piggyback 1300 50   Total Intake(mL/kg) 6087 (56.5) 497.5 (4.6)   Urine (mL/kg/hr) 2855 (1.1)    Emesis/NG output 500 (0.2)    Total Output 3355     Net +2732 +497.5         PHYSICAL EXAMINATION: General:  Elderly M in NAD Neuro:  Sedated and paralyzed HEENT:  Sclera anicteric, conjunctiva pink, MMM, ETT present Neck:  Supple and trachea midline, (-) LAN or JVD Cardiovascular:  RRR, NS1/S2, (-) MRG Lungs: Coarse mechanical breath sounds  bilaterally Abdomen:  S/NT/ND/(+)BS Musculoskeletal:  (-) C/C/E Skin:  Intact  LABS:  CBC Recent Labs     08/06/2013  2328  07/22/2013  2334  07/19/13  0430  07/20/13  0440  WBC  16.1*   --   13.0*  12.1*  HGB  12.4*  13.9  10.8*  12.0*  HCT  38.0*  41.0  32.5*  35.6*  PLT  244   --   254  255   Coag's Recent Labs     07/28/2013  2328  07/19/13  0430  07/19/13  1230  APTT  29  123*  31  INR  1.19  1.27  1.19   BMET Recent Labs    07/20/13  07/20/13  0440  07/20/13  0735  NA  135  133*  136  K  2.8*  3.0*  2.8*  CL  103  102  104  CO2  11*  12*  14*  BUN  7  7  7   CREATININE  0.54  0.53  0.54  GLUCOSE  240*  178*  138*    Electrolytes Recent Labs     07/19/13  0430   07/19/13  1900 07/20/13  07/20/13  0440  07/20/13  0735  CALCIUM  7.3*   < >  8.1*  8.2*  8.4  8.6  MG  1.7   --  2.1   --   1.8   --   PHOS   --    --    --    --   1.1*   --    < > = values in this interval not displayed.   Sepsis Markers No results found for this basename: LACTICACIDVEN, PROCALCITON, O2SATVEN,  in the last 72 hours  ABG Recent Labs     07/19/13  0258  07/19/13  2323  07/20/13  0424  PHART  7.404  7.343*  7.430  PCO2ART  33.9*  19.4*  18.0*  PO2ART  113.0*  98.4  63.9*   Liver Enzymes No results found for this basename: AST, ALT, ALKPHOS, BILITOT, ALBUMIN,  in the last 72 hours  Cardiac Enzymes Recent Labs     07/14/2013  2328  07/19/13  0430  07/19/13  1230  07/19/13  1600  TROPONINI  10.20*  16.05*  13.70*  10.60*  PROBNP  462.6*  552.4*   --    --     Glucose Recent Labs     07/20/13  0203  07/20/13  0305  07/20/13  0354  07/20/13  0355  07/20/13  0454  07/20/13  0559  GLUCAP  200*  206*  151*  180*  156*  131*    Imaging Dg Chest Port 1 View  07/20/2013   CLINICAL DATA:  Endotracheal tube evaluation.  EXAM: PORTABLE CHEST - 1 VIEW  COMPARISON:  07/19/2013  FINDINGS: Endotracheal tube is appreciated with tip crossing 1 cm below the clavicles and  4.3 cm above the carina. A right-sided internal jugular catheter is appreciated with tip projecting in the region of superior vena cava. There is blunting left costophrenic angle. No focal regions of consolidation identified. Mild prominence of interstitial markings appreciated. Bilateral perihilar densities appreciated right greater than left. NG tube is seen with tip off the view of this study. Visualized osseous structures unremarkable.  IMPRESSION: 1. Endotracheal tube 4.3 cm above the carina. Remaining support lines and tubes as described above. 2. Blunted right costophrenic angle small effusion versus chronic scarring 3. Pulmonary vascular congestion with bilateral perihilar densities.   Electronically Signed   By: Salome Holmes M.D.   On: 07/20/2013 09:11   Dg Chest Port 1 View  07/19/2013   CLINICAL DATA:  Repositioning of endotracheal tube and placement of orogastric tube.  EXAM: PORTABLE CHEST - 1 VIEW  COMPARISON:  Chest radiograph performed earlier today at 3:48 a.m.  FINDINGS: The patient's endotracheal tube is seen ending 4 cm above the carina. A right IJ line is noted ending about the proximal to mid SVC. An enteric tube is noted extending below the diaphragm.  Lung expansion is slightly improved. Vascular congestion is seen, with bilateral perihilar airspace opacification, worse on the right. This may reflect pneumonia or possibly mild pulmonary edema. No pleural effusion or pneumothorax is seen.  The cardiomediastinal silhouette is borderline normal in size. No acute osseous abnormalities are identified.  IMPRESSION: 1. Endotracheal tube seen ending 4 cm above the carina. 2. Orogastric tube noted extending below the diaphragm. 3. Vascular congestion, with bilateral perihilar airspace opacification, worse on the right. This may reflect pneumonia or possibly mild pulmonary edema.   Electronically Signed   By: Roanna Raider M.D.   On: 07/19/2013 04:17   Dg Chest Portable 1 View  07/19/2013    CLINICAL DATA:  Cardiac arrest.  EXAM: PORTABLE CHEST - 1 VIEW  COMPARISON:  Chest radiograph May 19, 2013  FINDINGS: The cardiac silhouette appears mildly enlarged, similar. Mildly calcified aortic knob. Increased central pulmonary vasculature congestion with right upper lobe and perihilar airspace opacities. No pleural effusions. Suspected pleural PAC and right lung base. No pneumothorax. Right internal jugular central venous catheter with distal tip projecting in proximal superior vena cava. Endotracheal tube tip projects 4.5 cm above the equina.  Multiple EKG lines overlie the patient and may obscure subtle underlying pathology. Soft tissue planes and included osseous structures are nonsuspicious.  IMPRESSION: Endotracheal tube and right internal jugular central venous catheter in apparent good position.  Mild cardiomegaly and central pulmonary vasculature congestion with perihilar/right upper lobe airspace opacities which may reflect confluent edema, less likely pneumonia.   Electronically Signed   By: Awilda Metro   On: 07/19/2013 04:06   CXR: Chest x-ray from time was personally reviewed by me. There are mild bilateral nonspecific interstitial markings which could represent edema or the sequela of aspiration. Endotracheal tube has been advanced in acceptable position. A right internal jugular central venous catheter is present.   ASSESSMENT / PLAN: Principal Problem:   ST elevation myocardial infarction (STEMI) of anterior wall, initial episode of care Active Problems:   HTN (hypertension)   Hypothyroidism   Cardiac arrest - In setting of Anterior STEMI   Hyperglycemia   Acute respiratory failure   Presence of drug coated stent in LAD coronary artery: Xience Xpedition DES 2.5 mm x 23 m (2.75 mm)   CAD S/P percutaneous coronary angioplasty - Anterior STEMI, DES to mid LAD   Cardiogenic shock  1. Acute respiratory failure: This is most likely secondary to the patient's cardiac arrest.  He may be developing ARDS based on his chest x-ray versus mild pulmonary edema from his recent cardiac event. Currently, there there are no difficulties oxygenating or ventilating him.  Lung protective ventilation  VAP prevention  Vent adjusted for ABG, decrease RR to 24.  No weaning til after warmed and pending mental status.  2. Cardiac Arrest: This is almost certainly secondary to the patient's acute ST segment elevation myocardial infarction. He is status post intervention.  Cooling protocol, currently warming.  Coronary artery disease/STEMI management per cardiology  Levophed and neo as ordered.  Stress dose steroids ordered.  Check cortisol level.  3. Hyperglycemia: This may be a reflection of underlying diabetes versus the acute insult.  SSI  4. Hypothyrodisim  Check TSH  5. Renal: hypokalemia and hypomag, replace and recheck.  Additional 20 meq given.  6. GI: NPO while paralyzed and continue SUP.  7. Heme: No active issues.  Daily CBC and transfusion per ICU protocol.  8. Neuro: 20 minutes downtime before ROSC.  Head CT once cooling is complete and hemodynamically stable.   Spoke with wife at length bedside.  Patient evidently has a living will that states he does not wish to be on prolonged life support.  They are awiating for our conclusion from a neurologic standpoint then will readdress.  Full code til then.  I have personally obtained a history, examined the patient, evaluated laboratory and imaging results, formulated the assessment and plan and placed orders.  CRITICAL CARE: The patient is critically ill with multiple organ systems failure and requires high complexity decision making for assessment and support, frequent evaluation and titration of therapies, application of advanced monitoring technologies and extensive interpretation of multiple databases. Critical Care Time devoted to patient care services described in this note is 35 minutes.  Alyson Reedy, M.D. [facility]  Pulmonary/Critical Care Medicine. Pager: [phone]. After hours pager: [phone].

## 2013-07-20 NOTE — Progress Notes (Signed)
DAILY PROGRESS NOTE  Subjective:  Events noted yesterday, including development of shock post-STEMI. Now intubated, sedated/paralyzed on pressors - [facility] protocol. Anterior ST elevation changes are evolving on EKG today. He has had persistent hypokalemia. Weaning pressors today.  Objective:  Temp:  [90.5 F (32.5 C)-91.9 F (33.3 C)] 91.6 F (33.1 C) (12/09 0700) Pulse Rate:  [32-120] 87 (12/09 0739) Resp:  [0-30] 30 (12/09 0739) BP: (78-151)/(39-73) 151/73 mmHg (12/09 0315) SpO2:  [95 %-100 %] 100 % (12/09 0739) Arterial Line BP: (80-183)/(47-82) 172/82 mmHg (12/09 0700) FiO2 (%):  [40 %-60 %] 40 % (12/09 0739) Weight:  [237 lb 7 oz (107.7 kg)] 237 lb 7 oz (107.7 kg) (12/09 0352) Weight change: 5 lb 15.2 oz (2.7 kg)  Intake/Output from previous day: 12/08 0701 - 12/09 0700 In: 6087 [I.V.:4727; NG/GT:60; IV Piggyback:1300] Out: 3355 [Urine:2855; Emesis/NG output:500]  Intake/Output from this shift:    Medications: Current Facility-Administered Medications  Medication Dose Route Frequency Provider Last Rate Last Dose  . 0.9 %  sodium chloride infusion  250 mL Intravenous PRN Marykay Lex, MD      . 0.9 %  sodium chloride infusion   Intravenous Continuous Alyson Reedy, MD 50 mL/hr at 07/20/13 0000 50 mL/hr at 07/20/13 0000  . 0.9 %  sodium chloride infusion   Intravenous Continuous Alyson Reedy, MD 10 mL/hr at 07/19/13 2348 10 mL/hr at 07/19/13 2348  . acetaminophen (TYLENOL) tablet 650 mg  650 mg Oral Q4H PRN Marykay Lex, MD      . amiodarone (NEXTERONE PREMIX) 360 mg/200 mL dextrose IV infusion  30 mg/hr Intravenous Continuous Marykay Lex, MD 16.7 mL/hr at 07/19/13 2349 30 mg/hr at 07/19/13 2349  . antiseptic oral rinse (BIOTENE) solution 15 mL  15 mL Mouth Rinse QID Lupita Leash, MD   15 mL at 07/20/13 0400  . artificial tears (LACRILUBE) ophthalmic ointment 1 application  1 application Both Eyes Q8H Lupita Leash, MD   1 application at  07/20/13 0610  . aspirin chewable tablet 81 mg  81 mg Per Tube Daily Marykay Lex, MD   81 mg at 07/19/13 1000  . atorvastatin (LIPITOR) tablet 80 mg  80 mg Per Tube q1800 Marykay Lex, MD   80 mg at 07/19/13 1800  . carvedilol (COREG) tablet 3.125 mg  3.125 mg Per Tube BID WC Marykay Lex, MD      . chlorhexidine (PERIDEX) 0.12 % solution 15 mL  15 mL Mouth Rinse BID Lupita Leash, MD   15 mL at 07/20/13 0728  . cisatracurium (NIMBEX) 200 mg in sodium chloride 0.9 % 200 mL infusion  1-1.5 mcg/kg/min Intravenous Continuous Lupita Leash, MD 6.3 mL/hr at 07/20/13 0454 1 mcg/kg/min at 07/20/13 0981   And  . cisatracurium (NIMBEX) bolus via infusion 5.3 mg  0.05 mg/kg Intravenous PRN Lupita Leash, MD      . DOPamine (INTROPIN) 800 mg in dextrose 5 % 250 mL infusion  10 mcg/kg/min Intravenous Titrated Alyson Reedy, MD   10 mcg/kg/min at 07/19/13 1115  . fentaNYL (SUBLIMAZE) 10 mcg/mL in sodium chloride 0.9 % 250 mL infusion  25-300 mcg/hr Intravenous Continuous Lupita Leash, MD 5 mL/hr at 07/19/13 1210 50 mcg/hr at 07/19/13 1210  . fentaNYL (SUBLIMAZE) bolus via infusion 50 mcg  50 mcg Intravenous Q15 min PRN Lupita Leash, MD      . heparin injection 5,000 Units  5,000 Units Subcutaneous  Q8H Lupita Leash, MD   5,000 Units at 07/20/13 1610  . hydrALAZINE (APRESOLINE) injection 20 mg  20 mg Intravenous Q4H PRN Marykay Lex, MD   20 mg at 07/19/13 0521  . insulin regular (NOVOLIN R,HUMULIN R) 1 Units/mL in sodium chloride 0.9 % 100 mL infusion   Intravenous Continuous Lupita Leash, MD 11.3 mL/hr at 07/20/13 0754 11.3 Units/hr at 07/20/13 0754  . levothyroxine (SYNTHROID, LEVOTHROID) tablet 50 mcg  50 mcg Oral Custom Leeann Must, MD   50 mcg at 07/20/13 0612  . [START ON 26-Jul-2013] levothyroxine (SYNTHROID, LEVOTHROID) tablet 75 mcg  75 mcg Oral Custom Leeann Must, MD      . midazolam (VERSED) 1 mg/mL in sodium chloride 0.9 % 50 mL infusion  1-10 mg/hr  Intravenous Continuous Lupita Leash, MD 4 mL/hr at 07/20/13 0200 4 mg/hr at 07/20/13 0200  . midazolam (VERSED) bolus via infusion 2 mg  2 mg Intravenous Q30 min PRN Lupita Leash, MD      . nitroGLYCERIN (NITROSTAT) SL tablet 0.4 mg  0.4 mg Sublingual Q5 Min x 3 PRN Leeann Must, MD      . nitroGLYCERIN 0.2 mg/mL in dextrose 5 % infusion  5 mcg/min Intravenous Titrated Marykay Lex, MD      . norepinephrine (LEVOPHED) 16 mg in dextrose 5 % 250 mL infusion  0.5-10 mcg/min Intravenous Titrated Alyson Reedy, MD 46.9 mL/hr at 07/20/13 0444 50 mcg/min at 07/20/13 0444  . ondansetron (ZOFRAN) injection 4 mg  4 mg Intravenous Q6H PRN Marykay Lex, MD      . pantoprazole (PROTONIX) injection 40 mg  40 mg Intravenous Q24H Jenelle Mages, MD   40 mg at 07/19/13 1000  . phenylephrine (NEO-SYNEPHRINE) 40 mg in dextrose 5 % 250 mL infusion  30-200 mcg/min Intravenous Titrated Alyson Reedy, MD 18.8 mL/hr at 07/20/13 0747 50 mcg/min at 07/20/13 0747  . sodium bicarbonate 150 mEq in dextrose 5 % 1,000 mL infusion   Intravenous Continuous Coralyn Helling, MD 50 mL/hr at 07/19/13 1743    . sodium chloride 0.9 % injection 3 mL  3 mL Intravenous Q12H Marykay Lex, MD   3 mL at 07/19/13 0746  . sodium chloride 0.9 % injection 3 mL  3 mL Intravenous PRN Marykay Lex, MD      . Ticagrelor [facility]) tablet 90 mg  90 mg Oral BID Marykay Lex, MD   90 mg at 07/19/13 2134    Physical Exam: General appearance: Sedated, paralyzed on vent, arctic sun protocol Neck: no carotid bruit and no JVD Lungs: clear to auscultation bilaterally Heart: regular rate and rhythm, S1, S2 normal, no murmur, click, rub or gallop Abdomen: soft, non-tender; bowel sounds normal; no masses,  no organomegaly Extremities: extremities normal, atraumatic, no cyanosis or edema Pulses: 1+ pulses DP Skin: Skin color, texture, turgor normal. No rashes or lesions or cool, dry Neurologic: Mental status: Sedated, paralyzed on   vent Psych- (N/A)  Lab Results: Labs reviewed  Imaging: Imaging results have been reviewed  Assessment:  1. Principal Problem: 2.   ST elevation myocardial infarction (STEMI) of anterior wall, initial episode of care 3. Active Problems: 4.   HTN (hypertension) 5.   Hypothyroidism 6.   Cardiac arrest - In setting of Anterior STEMI 7.   Hyperglycemia 8.   Acute respiratory failure 9.   Presence of drug coated stent in LAD coronary artery: Xience Xpedition DES 2.5 mm x 23 m (  2.75 mm) 10.   CAD S/P percutaneous coronary angioplasty - Anterior STEMI, DES to mid LAD 11.   Cardiogenic shock 12.   Plan:  1. Continue to wean pressors today - MAP 95 this am. Plan for active rewarming later. ST elevation appears to be evolving. Continue to replete electrolytes. Continue low dose coreg, ASA, brillinta, statin per tube. EF is low normal by echo at 50-55%. Not entirely clear why he responded with aggressive ventricular arrhythmias and cardiogenic shock from a mid-distal LAD occlusion.    Time Spent Directly with Patient:  30 minutes  Length of Stay:  LOS: 2 days   Chrystie Nose, MD, [facility] Attending Cardiologist [facility]  Irisa Grimsley C 07/20/2013, 8:12 AM

## 2013-07-21 ENCOUNTER — Encounter (HOSPITAL_COMMUNITY): Payer: Self-pay | Admitting: Radiology

## 2013-07-21 ENCOUNTER — Inpatient Hospital Stay (HOSPITAL_COMMUNITY): Payer: Medicare Other

## 2013-07-21 DIAGNOSIS — I2510 Atherosclerotic heart disease of native coronary artery without angina pectoris: Secondary | ICD-10-CM

## 2013-07-21 DIAGNOSIS — G40901 Epilepsy, unspecified, not intractable, with status epilepticus: Secondary | ICD-10-CM | POA: Diagnosis present

## 2013-07-21 DIAGNOSIS — Z8546 Personal history of malignant neoplasm of prostate: Secondary | ICD-10-CM

## 2013-07-21 DIAGNOSIS — J690 Pneumonitis due to inhalation of food and vomit: Secondary | ICD-10-CM

## 2013-07-21 DIAGNOSIS — Z9861 Coronary angioplasty status: Secondary | ICD-10-CM

## 2013-07-21 DIAGNOSIS — G931 Anoxic brain damage, not elsewhere classified: Secondary | ICD-10-CM | POA: Diagnosis present

## 2013-07-21 DIAGNOSIS — R569 Unspecified convulsions: Secondary | ICD-10-CM

## 2013-07-21 DIAGNOSIS — G40401 Other generalized epilepsy and epileptic syndromes, not intractable, with status epilepticus: Secondary | ICD-10-CM

## 2013-07-21 LAB — POCT I-STAT 3, ART BLOOD GAS (G3+)
Acid-base deficit: 15 mmol/L — ABNORMAL HIGH (ref 0.0–2.0)
Acid-base deficit: 3 mmol/L — ABNORMAL HIGH (ref 0.0–2.0)
Acid-base deficit: 4 mmol/L — ABNORMAL HIGH (ref 0.0–2.0)
Bicarbonate: 20.7 mEq/L (ref 20.0–24.0)
Bicarbonate: 19.5 mEq/L — ABNORMAL LOW (ref 20.0–24.0)
O2 Saturation: 98 %
Patient temperature: 32.8
Patient temperature: 98.8
Patient temperature: 38.7
TCO2: 13 mmol/L (ref 0–100)
pCO2 arterial: 29.8 mmHg — ABNORMAL LOW (ref 35.0–45.0)
pCO2 arterial: 31.5 mmHg — ABNORMAL LOW (ref 35.0–45.0)
pH, Arterial: 7.406 (ref 7.350–7.450)
pO2, Arterial: 112 mmHg — ABNORMAL HIGH (ref 80.0–100.0)

## 2013-07-21 LAB — BLOOD GAS, ARTERIAL
Drawn by: 331761
MECHVT: 580 mL
O2 Saturation: 95.3 %
PEEP: 5 cmH2O
RATE: 24 resp/min
TCO2: 21.9 mmol/L (ref 0–100)
pCO2 arterial: 27.9 mmHg — ABNORMAL LOW (ref 35.0–45.0)
pH, Arterial: 7.501 — ABNORMAL HIGH (ref 7.350–7.450)

## 2013-07-21 LAB — POCT I-STAT, CHEM 8
BUN: 4 mg/dL — ABNORMAL LOW (ref 6–23)
BUN: 5 mg/dL — ABNORMAL LOW (ref 6–23)
Calcium, Ion: 1.03 mmol/L — ABNORMAL LOW (ref 1.13–1.30)
Calcium, Ion: 1.05 mmol/L — ABNORMAL LOW (ref 1.13–1.30)
Calcium, Ion: 1.07 mmol/L — ABNORMAL LOW (ref 1.13–1.30)
Chloride: 103 mEq/L (ref 96–112)
Chloride: 105 mEq/L (ref 96–112)
Creatinine, Ser: 0.6 mg/dL (ref 0.50–1.35)
Creatinine, Ser: 0.6 mg/dL (ref 0.50–1.35)
Creatinine, Ser: 0.5 mg/dL (ref 0.50–1.35)
Glucose, Bld: 230 mg/dL — ABNORMAL HIGH (ref 70–99)
Glucose, Bld: 252 mg/dL — ABNORMAL HIGH (ref 70–99)
Glucose, Bld: 275 mg/dL — ABNORMAL HIGH (ref 70–99)
HCT: 37 % — ABNORMAL LOW (ref 39.0–52.0)
Hemoglobin: 11.2 g/dL — ABNORMAL LOW (ref 13.0–17.0)
Hemoglobin: 12.6 g/dL — ABNORMAL LOW (ref 13.0–17.0)
Potassium: 2.6 mEq/L — CL (ref 3.5–5.1)
Potassium: 2.7 mEq/L — CL (ref 3.5–5.1)
Sodium: 139 mEq/L (ref 135–145)
TCO2: 16 mmol/L (ref 0–100)
TCO2: 16 mmol/L (ref 0–100)

## 2013-07-21 LAB — URINE MICROSCOPIC-ADD ON

## 2013-07-21 LAB — URINALYSIS, ROUTINE W REFLEX MICROSCOPIC
Glucose, UA: NEGATIVE mg/dL
Protein, ur: 100 mg/dL — AB
pH: 5.5 (ref 5.0–8.0)

## 2013-07-21 LAB — CBC
HCT: 28.4 % — ABNORMAL LOW (ref 39.0–52.0)
Hemoglobin: 9.6 g/dL — ABNORMAL LOW (ref 13.0–17.0)
MCHC: 33.8 g/dL (ref 30.0–36.0)
RDW: 14.7 % (ref 11.5–15.5)

## 2013-07-21 LAB — BASIC METABOLIC PANEL
BUN: 15 mg/dL (ref 6–23)
Calcium: 7.7 mg/dL — ABNORMAL LOW (ref 8.4–10.5)
Chloride: 103 mEq/L (ref 96–112)
Creatinine, Ser: 1.37 mg/dL — ABNORMAL HIGH (ref 0.50–1.35)
GFR calc Af Amer: 57 mL/min — ABNORMAL LOW (ref >90)
Glucose, Bld: 123 mg/dL — ABNORMAL HIGH (ref 70–99)
Potassium: 4.3 mEq/L (ref 3.5–5.1)
Sodium: 134 mEq/L — ABNORMAL LOW (ref 135–145)

## 2013-07-21 LAB — GLUCOSE, CAPILLARY
Glucose-Capillary: 106 mg/dL — ABNORMAL HIGH (ref 70–99)
Glucose-Capillary: 120 mg/dL — ABNORMAL HIGH (ref 70–99)
Glucose-Capillary: 100 mg/dL — ABNORMAL HIGH (ref 70–99)
Glucose-Capillary: 78 mg/dL (ref 70–99)

## 2013-07-21 LAB — PHOSPHORUS: Phosphorus: 2 mg/dL — ABNORMAL LOW (ref 2.3–4.6)

## 2013-07-21 MED ORDER — LORAZEPAM 2 MG/ML IJ SOLN
INTRAMUSCULAR | Status: AC
  Administered 2013-07-21: 2 mg via INTRAVENOUS
  Filled 2013-07-21: qty 1

## 2013-07-21 MED ORDER — SODIUM CHLORIDE 0.9 % IV SOLN
500.0000 mg | Freq: Two times a day (BID) | INTRAVENOUS | Status: DC
  Filled 2013-07-21 (×2): qty 5

## 2013-07-21 MED ORDER — SODIUM CHLORIDE 0.9 % IV SOLN
1250.0000 mg | INTRAVENOUS | Status: AC
  Administered 2013-07-21: 1250 mg via INTRAVENOUS
  Filled 2013-07-21: qty 25

## 2013-07-21 MED ORDER — SODIUM PHOSPHATE 3 MMOLE/ML IV SOLN
15.0000 mmol | Freq: Once | INTRAVENOUS | Status: AC
  Administered 2013-07-21: 15 mmol via INTRAVENOUS
  Filled 2013-07-21: qty 5

## 2013-07-21 MED ORDER — SODIUM CHLORIDE 0.9 % IV SOLN
500.0000 mg | Freq: Two times a day (BID) | INTRAVENOUS | Status: DC
  Administered 2013-07-21 – 2013-07-23 (×7): 500 mg via INTRAVENOUS
  Filled 2013-07-21 (×9): qty 5

## 2013-07-21 MED ORDER — SODIUM CHLORIDE 0.9 % IV SOLN
1000.0000 mg | Freq: Once | INTRAVENOUS | Status: AC
  Administered 2013-07-21: 1000 mg via INTRAVENOUS
  Filled 2013-07-21: qty 10

## 2013-07-21 MED ORDER — SODIUM CHLORIDE 0.9 % IV SOLN
3.0000 g | Freq: Four times a day (QID) | INTRAVENOUS | Status: DC
  Administered 2013-07-21 – 2013-07-22 (×4): 3 g via INTRAVENOUS
  Filled 2013-07-21 (×7): qty 3

## 2013-07-21 MED ORDER — SODIUM CHLORIDE 0.9 % IV BOLUS (SEPSIS)
500.0000 mL | Freq: Once | INTRAVENOUS | Status: AC
  Administered 2013-07-21: 500 mL via INTRAVENOUS

## 2013-07-21 MED ORDER — LORAZEPAM 2 MG/ML IJ SOLN
2.0000 mg | Freq: Once | INTRAMUSCULAR | Status: AC
  Administered 2013-07-21: 2 mg via INTRAVENOUS

## 2013-07-21 MED ORDER — PHENYLEPHRINE HCL 10 MG/ML IJ SOLN
30.0000 ug/min | INTRAVENOUS | Status: DC
  Administered 2013-07-22: 50 ug/min via INTRAVENOUS
  Filled 2013-07-21: qty 1

## 2013-07-21 MED ORDER — TICAGRELOR 90 MG PO TABS
90.0000 mg | ORAL_TABLET | Freq: Two times a day (BID) | ORAL | Status: DC
  Administered 2013-07-21 – 2013-07-23 (×7): 90 mg via ORAL
  Filled 2013-07-21 (×9): qty 1

## 2013-07-21 MED ORDER — MAGNESIUM SULFATE 50 % IJ SOLN
2.0000 g | Freq: Once | INTRAVENOUS | Status: DC
  Filled 2013-07-21: qty 4

## 2013-07-21 MED ORDER — LORAZEPAM 2 MG/ML IJ SOLN
2.0000 mg | Freq: Once | INTRAMUSCULAR | Status: AC
  Administered 2013-07-21: 2 mg via INTRAVENOUS
  Filled 2013-07-21: qty 1

## 2013-07-21 MED ORDER — LORAZEPAM 2 MG/ML IJ SOLN
INTRAMUSCULAR | Status: AC
  Administered 2013-07-21: 2 mg
  Filled 2013-07-21: qty 1

## 2013-07-21 MED ORDER — LORAZEPAM 2 MG/ML IJ SOLN
2.0000 mg | INTRAMUSCULAR | Status: AC
  Administered 2013-07-21: 2 mg via INTRAVENOUS

## 2013-07-21 MED ORDER — SODIUM CHLORIDE 0.9 % IV BOLUS (SEPSIS)
1000.0000 mL | Freq: Once | INTRAVENOUS | Status: AC
  Administered 2013-07-21: 1000 mL via INTRAVENOUS

## 2013-07-21 MED ORDER — ACETAMINOPHEN 160 MG/5ML PO SOLN: 650.0000 mg | ORAL | Status: DC | PRN

## 2013-07-21 MED ORDER — VALPROATE SODIUM 500 MG/5ML IV SOLN
2200.0000 mg | INTRAVENOUS | Status: AC
  Administered 2013-07-21: 2200 mg via INTRAVENOUS
  Filled 2013-07-21: qty 22

## 2013-07-21 MED ORDER — PHENYTOIN SODIUM 50 MG/ML IJ SOLN
100.0000 mg | Freq: Three times a day (TID) | INTRAMUSCULAR | Status: DC
  Administered 2013-07-21 – 2013-07-24 (×8): 100 mg via INTRAVENOUS
  Filled 2013-07-21 (×11): qty 2

## 2013-07-21 MED ORDER — FENTANYL CITRATE 0.05 MG/ML IJ SOLN
25.0000 ug | INTRAMUSCULAR | Status: DC | PRN
  Administered 2013-07-21 (×2): 100 ug via INTRAVENOUS
  Filled 2013-07-21 (×2): qty 2

## 2013-07-21 MED ORDER — MIDAZOLAM HCL 2 MG/2ML IJ SOLN
2.0000 mg | INTRAMUSCULAR | Status: DC | PRN
  Administered 2013-07-21: 2 mg via INTRAVENOUS
  Filled 2013-07-21: qty 2

## 2013-07-21 MED ORDER — FUROSEMIDE 10 MG/ML IJ SOLN
40.0000 mg | Freq: Once | INTRAMUSCULAR | Status: AC
  Administered 2013-07-21: 40 mg via INTRAVENOUS
  Filled 2013-07-21: qty 4

## 2013-07-21 MED ORDER — MAGNESIUM SULFATE 40 MG/ML IJ SOLN
2.0000 g | Freq: Once | INTRAMUSCULAR | Status: AC
  Administered 2013-07-21: 2 g via INTRAVENOUS
  Filled 2013-07-21: qty 50

## 2013-07-21 MED ORDER — VASOPRESSIN 20 UNIT/ML IJ SOLN
0.0300 [IU]/min | INTRAVENOUS | Status: DC
  Administered 2013-07-21 – 2013-07-23 (×2): 0.03 [IU]/min via INTRAVENOUS
  Filled 2013-07-21 (×2): qty 2.5

## 2013-07-21 MED ORDER — ARTIFICIAL TEARS OP OINT
TOPICAL_OINTMENT | Freq: Three times a day (TID) | OPHTHALMIC | Status: DC
  Administered 2013-07-21 – 2013-07-22 (×5): via OPHTHALMIC
  Filled 2013-07-21: qty 3.5

## 2013-07-21 MED ORDER — PROPOFOL 10 MG/ML IV EMUL
5.0000 ug/kg/min | INTRAVENOUS | Status: DC
  Administered 2013-07-21 (×2): 2.75 mL via INTRAVENOUS
  Administered 2013-07-21: 5 ug/kg/min via INTRAVENOUS
  Administered 2013-07-21 (×3): 2.75 mL via INTRAVENOUS
  Administered 2013-07-22: 70 ug/kg/min via INTRAVENOUS
  Administered 2013-07-22: 80 ug/kg/min via INTRAVENOUS
  Administered 2013-07-22: 35 ug/kg/min via INTRAVENOUS
  Administered 2013-07-22 (×2): 80 ug/kg/min via INTRAVENOUS
  Administered 2013-07-22: 2.75 mL via INTRAVENOUS
  Administered 2013-07-22: 35 ug/kg/min via INTRAVENOUS
  Administered 2013-07-22: 50 ug/kg/min via INTRAVENOUS
  Administered 2013-07-22 (×2): 60 ug/kg/min via INTRAVENOUS
  Administered 2013-07-23: 45 ug/kg/min via INTRAVENOUS
  Administered 2013-07-23: 10 ug/kg/min via INTRAVENOUS
  Administered 2013-07-23: 30 ug/kg/min via INTRAVENOUS
  Administered 2013-07-23: 45 ug/kg/min via INTRAVENOUS
  Administered 2013-07-23: 30 ug/kg/min via INTRAVENOUS
  Filled 2013-07-21 (×14): qty 100

## 2013-07-21 NOTE — Progress Notes (Signed)
eLink Physician-Brief Progress Note Patient Name: [name] DOB: 05-29-1936 MRN: [id number]  Date of Service  07/21/2013   HPI/Events of Note  Nurse has noted tremor of lower lip since re-warming that has worsened since 8pm.  Camera check shows eyes open with no spontaneous blink response.  Also with periodic rhymthic movement of lower lip/jaw.  Patient also with oliguria.  Has been hypotensive and received dye load with CC.  Has been net positive over the past 2 days greater than 5 liters.  CVP of 7 on pressors but slowly coming off.   eICU Interventions  Plan: 1. Ativan 2 mg IV times one - if this slows down or stops jaw movement will load with keppra - concern for seizures. 2. Lacrilube for eye protection 3. Fluid bolus for oliguria but concern for ATN/dye associated ARI.   Intervention Category Major Interventions: Seizures - evaluation and management  [name] 07/21/2013, 1:15 AM

## 2013-07-21 NOTE — Progress Notes (Signed)
ANTIBIOTIC CONSULT NOTE - INITIAL  Pharmacy Consult:  Unasyn Indication:  Aspiration PNA  No Known Allergies  Patient Measurements: Height: 6' 1.62" (187 cm) Weight: 243 lb 2.7 oz (110.3 kg) IBW/kg (Calculated) : 81.33  Vital Signs: Temp: 102 F (38.9 C) (12/10 0900) Temp src: Core (Comment) (12/10 0800) BP: 159/68 mmHg (12/10 0918) Pulse Rate: 117 (12/10 0918) Intake/Output from previous day: 12/09 0701 - 12/10 0700 In: 4950.5 [I.V.:4135.5; NG/GT:60; IV Piggyback:755] Out: 448 [Urine:448] Intake/Output from this shift: Total I/O In: 430.2 [I.V.:255.2; NG/GT:70; IV Piggyback:105] Out: 15 [Urine:15]  Labs:  Recent Labs  07/19/13 0430  07/20/13 0440  07/20/13 1500 07/20/13 2000 07/21/13 0500  WBC 13.0*  --  12.1*  --   --   --  12.8*  HGB 10.8*  --  12.0*  --   --   --  9.6*  PLT 254  --  255  --   --   --  212  CREATININE 0.53  < > 0.53  < > 0.83 1.07 1.37*  < > = values in this interval not displayed. Estimated Creatinine Clearance: 62.2 ml/min (by C-G formula based on Cr of 1.37). No results found for this basename: VANCOTROUGH, Leodis Binet, VANCORANDOM, GENTTROUGH, GENTPEAK, GENTRANDOM, TOBRATROUGH, TOBRAPEAK, TOBRARND, AMIKACINPEAK, AMIKACINTROU, AMIKACIN,  in the last 72 hours   Microbiology: Recent Results (from the past 720 hour(s))  MRSA PCR SCREENING     Status: None   Collection Time    07/19/13  2:52 AM      Result Value Range Status   MRSA by PCR NEGATIVE  NEGATIVE Final   Comment:            The GeneXpert MRSA Assay (FDA     approved for NASAL specimens     only), is one component of a     comprehensive MRSA colonization     surveillance program. It is not     intended to diagnose MRSA     infection nor to guide or     monitor treatment for     MRSA infections.    Medical History: Past Medical History  Diagnosis Date  . Prostate cancer     hx of prostate ca  . Hypertension   . Thyroid disease   . Hypothyroidism   . Cough     due to  scarring of lung  . GERD (gastroesophageal reflux disease)   . Arthritis   . Complication of anesthesia     sometimes have trouble intubating not last 2 times; limited neck mobility--glidescope used in '08 and '10      Assessment: 76 YOM to start Unasyn for possible aspiration PNA s/p STEMI/cardiac arrest.  Patient's renal function is declining.   Goal of Therapy:  Clearance of infection   Plan:  - Unasyn 3gm IV Q6H - NaPhos 15 mmol IV x 1 per discussion with CCM - Monitor renal fxn, clinical course    Jacqui Headen D. Laney Potash, PharmD, BCPS Pager:  [phone] 07/21/2013, 10:26 AM

## 2013-07-21 NOTE — Progress Notes (Signed)
Routine changed to Continuous LTM. Day 1

## 2013-07-21 NOTE — Significant Event (Signed)
Hypotensive after starting diprivan for seizure suppression.  Will give NS fluid bolus, and add vasopressin to levophed.  Coralyn Helling, MD 07/21/2013, 9:41 PM

## 2013-07-21 NOTE — Progress Notes (Signed)
PULMONARY  / CRITICAL CARE MEDICINE CONSULTATION  Name: [name] MRN: [id number] DOB: [birth year]    ADMISSION DATE:  07/19/2013  CHIEF COMPLAINT:  Cardiac arrest  BRIEF PATIENT DESCRIPTION: 77 year-old male presenting the following cardiac arrest in the setting of STEMI. Pulmonary critical care medicine consult for ventilator management.  SIGNIFICANT EVENTS / STUDIES:  1. Cardiac arrest 07/13/2013 2. Intubated 07/30/2013 3. Cath 07/19/2013 - Preliminary report intervention done on LAD 4. 12-10 warmed and note rhythmic facial twitching. Frothy secretions.     LINES / TUBES: 1. RIJ CVC 12/8>>> 2. ET Tube 12/7>>> 3. R radial a-line 12/8>>>  CULTURES: 1. Sputum 12-10>> 2. BCx 2 12-10>> 3. UC 12-10>>   ANTIBIOTICS: 1. 12-10 Unasyn>>  PHYSICAL EXAM  VITAL SIGNS: Temp:  [93.2 F (34 C)-102 F (38.9 C)] 102 F (38.9 C) (12/10 0900) Pulse Rate:  [77-117] 117 (12/10 0918) Resp:  [0-34] 28 (12/10 0900) BP: (108-159)/(52-71) 159/68 mmHg (12/10 0918) SpO2:  [96 %-100 %] 98 % (12/10 0918) Arterial Line BP: (94-181)/(51-103) 138/61 mmHg (12/10 0900) FiO2 (%):  [40 %] 40 % (12/10 0918) Weight:  [243 lb 2.7 oz (110.3 kg)] 243 lb 2.7 oz (110.3 kg) (12/10 0500)  HEMODYNAMICS: CVP:  [4 mmHg-10 mmHg] 10 mmHg  VENTILATOR SETTINGS: Vent Mode:  [-] PRVC FiO2 (%):  [40 %] 40 % Set Rate:  [18 bmp-24 bmp] 18 bmp Vt Set:  [580 mL] 580 mL PEEP:  [5 cmH20] 5 cmH20 Plateau Pressure:  [17 cmH20-29 cmH20] 28 cmH20  INTAKE / OUTPUT: Intake/Output     12/09 0701 - 12/10 0700 12/10 0701 - 12/11 0700   I.V. (mL/kg) 4135.5 (37.5) 255.2 (2.3)   NG/GT 60 70   IV Piggyback 755 105   Total Intake(mL/kg) 4950.5 (44.9) 430.2 (3.9)   Urine (mL/kg/hr) 448 (0.2) 15 (0.1)   Emesis/NG output     Total Output 448 15   Net +4502.5 +415.2         PHYSICAL EXAMINATION: General:  Elderly with fixed gaze, frothy sputum Neuro:  Overbreathing vent, facial twitching. disconjugate gaze . No response  to painful stimuli HEENT:  Sclera anicteric, conjunctiva pink, MMM, ETT present Neck:  Supple and trachea midline, (-) LAN or JVD Cardiovascular:  RRR, NS1/S2, (-) MRG Lungs: Coarse  breath sounds bilaterally, copious secretions  Abdomen:  S/NT/ND/(+)BS Musculoskeletal:  (-) C/C/E Skin:  Intact  LABS:  CBC Recent Labs     07/19/13  0430  07/20/13  0440  07/21/13  0500  WBC  13.0*  12.1*  12.8*  HGB  10.8*  12.0*  9.6*  HCT  32.5*  35.6*  28.4*  PLT  254  255  212   Coag's Recent Labs     08/11/2013  2328  07/19/13  0430  07/19/13  1230  APTT  29  123*  31  INR  1.19  1.27  1.19   BMET Recent Labs     07/20/13  1500  07/20/13  2000  07/21/13  0500  NA  133*  132*  134*  K  3.1*  3.8  4.3  CL  102  101  103  CO2  17*  18*  19  BUN  9  10  15   CREATININE  0.83  1.07  1.37*  GLUCOSE  124*  138*  123*    Electrolytes Recent Labs     07/19/13  1900   07/20/13  0440   07/20/13  1500  07/20/13  2000  07/21/13  0500  CALCIUM  8.1*   < >  8.4   < >  8.1*  8.2*  7.7*  MG  2.1   --   1.8   --    --    --   1.6  PHOS   --    --   1.1*   --    --    --   2.0*   < > = values in this interval not displayed.   Sepsis Markers No results found for this basename: LACTICACIDVEN, PROCALCITON, O2SATVEN,  in the last 72 hours  ABG Recent Labs     07/19/13  2323  07/20/13  0424  07/21/13  0454  PHART  7.343*  7.430  7.501*  PCO2ART  19.4*  18.0*  27.9*  PO2ART  98.4  63.9*  76.4*   Liver Enzymes No results found for this basename: AST, ALT, ALKPHOS, BILITOT, ALBUMIN,  in the last 72 hours  Cardiac Enzymes Recent Labs     07/12/2013  2328  07/19/13  0430  07/19/13  1230  07/19/13  1600  TROPONINI  10.20*  16.05*  13.70*  10.60*  PROBNP  462.6*  552.4*   --    --     Glucose Recent Labs     07/20/13  1700  07/20/13  1756  07/20/13  2009  07/21/13  0035  07/21/13  0503  07/21/13  0735  GLUCAP  103*  93  127*  106*  120*  100*    Imaging Dg Chest Port  1 View  07/21/2013   CLINICAL DATA:  Acute respiratory failure.  EXAM: PORTABLE CHEST - 1 VIEW  COMPARISON:  07/20/2013  FINDINGS: Endotracheal tube, NG tube, and central line in good position. There is a new small to moderate left pleural effusion. Small right effusion persists. There is a hazy infiltrate in the right perihilar region extend into the upper lobe.  IMPRESSION: New left pleural effusion. Hazy infiltrate at the superior aspect of the right hilum.   Electronically Signed   By: Geanie Cooley M.D.   On: 07/21/2013 07:18   Dg Chest Port 1 View  07/20/2013   CLINICAL DATA:  Endotracheal tube evaluation.  EXAM: PORTABLE CHEST - 1 VIEW  COMPARISON:  07/19/2013  FINDINGS: Endotracheal tube is appreciated with tip crossing 1 cm below the clavicles and 4.3 cm above the carina. A right-sided internal jugular catheter is appreciated with tip projecting in the region of superior vena cava. There is blunting left costophrenic angle. No focal regions of consolidation identified. Mild prominence of interstitial markings appreciated. Bilateral perihilar densities appreciated right greater than left. NG tube is seen with tip off the view of this study. Visualized osseous structures unremarkable.  IMPRESSION: 1. Endotracheal tube 4.3 cm above the carina. Remaining support lines and tubes as described above. 2. Blunted right costophrenic angle small effusion versus chronic scarring 3. Pulmonary vascular congestion with bilateral perihilar densities.   Electronically Signed   By: Salome Holmes M.D.   On: 07/20/2013 09:11   CXR: see above  ASSESSMENT / PLAN: Principal Problem:   Cardiac arrest 12/7/14pm -out of hospital with CPR, VT In setting of Anterior STEMI Active Problems:   HTN (hypertension)   Hypothyroidism   Hyperglycemia   Acute respiratory failure   CAD- urgent DES to mid LAD (STEMI) 07/19/13   Cardiogenic shock   H/O prostate cancer   Aspiration pneumonia   Seizure  1. Acute respiratory  failure: This is most likely secondary to the patient's cardiac arrest. Complicated by aspiration and seizures,   Lung protective ventilation  VAP prevention  Vent adjusted for ABG, decrease MV  Consider sbt, no extubation planned  2. Cardiac Arrest: This is almost certainly secondary to the patient's acute ST segment elevation myocardial infarction. He is status post intervention.  Hypothermia protocol , warmed 12-10 with szs vs myoclonus noted and AMS.  Coronary artery disease/STEMI management per cardiology  Levophed and neo as ordered, can accept lower MPA goals  Stress dose steroids, dc  3. Hyperglycemia: This may be a reflection of underlying diabetes versus the acute insult. CBG (last 3)   Recent Labs  07/21/13 0035 07/21/13 0503 07/21/13 0735  GLUCAP 106* 120* 100*      SSI  4. Hypothyrodisim  Check TSH(.582)  5. Renal:  Recent Labs Lab 07/20/13 1500 07/20/13 2000 07/21/13 0500  K 3.1* 3.8 4.3    Lab Results  Component Value Date   CREATININE 1.37* 07/21/2013   CREATININE 1.07 07/20/2013   CREATININE 0.83 07/20/2013   .replete as needed.  6. GI: consider TF  7. Heme: No active issues.  Daily CBC and transfusion per ICU protocol.  8. Neuro: 20 minutes downtime before ROSC.  Head CT once cooling is complete and hemodynamically stable.  Szs activity and indications of hypoxic brain injury. Ct and EEG of head Ativan needed Anticonvulsants as needed Will prognosticate with eeg and ct and exam then meet with family  ID R/o asp unasyn sputum   .  Patient evidently has a living will that states he does not wish to be on prolonged life support.  The family are awiating for our conclusion from a neurologic standpoint then will readdress.  Full code til then.  Brett Canales Minor ACNP [facility] PCCM Pager [phone] till 3 pm If no answer page [phone] 07/21/2013, 9:46 AM    Ccm time 30 min   I have fully examined this patient and agree with above  findings.    And edited infull  Mcarthur Rossetti. Tyson Alias, MD, FACP Pgr: [phone] [facility]

## 2013-07-21 NOTE — Progress Notes (Signed)
Fentanyl and Versed drips d/c'd per post hypothermia orders.  225cc Fentanyl and 40cc Versed wasted in sink with 2 RN's.

## 2013-07-21 NOTE — Progress Notes (Signed)
NUTRITION FOLLOW UP  Intervention:   1. Enteral nutrition; If TFs warranted, recommend Vital HP @ 20 ml/hr via NG tube and increase by 10 ml every 6 hours to goal rate of 80 ml/hr. At goal rate, tube feeding regimen will provide 1920 kcal, 168 grams of protein, and 1610 ml of H2O plus IV fluids. Regimen will meet 100% of estimated energy needs and 100% of estimated protein needs. 2.  GOC; nutrition interventions to remain appropriate per GOC.  Nutrition Dx:   Inadequate oral intake, ongoing.   Monitor:   1.  Enteral nutrition; initiation with tolerance.  Enteral nutrition to provide 60-70% of estimated calorie needs (22-25 kcals/kg ideal body weight) and 100% of estimated protein needs, based on ASPEN guidelines for permissive underfeeding in critically ill obese individuals. 2.  Wt/wt change; monitor trends 3.  GOC; monitor for changes.   Assessment:   77 yo man with PMH of hypertension, hypothyroidism with recent October hospitalization with laminectomy in lumbar region who was BIBA after cardiac arrest at home. His wife left their living and came back and found her husband unresponsive leading her to call EMS. On arrival, Mr. Tallman had no pulse, CPR started, initial rhythm VT, shocked x1, then asystole with CPR 1-2 minutes, next rhythm shockable and returned to tachycardia. Code STEMI called and cath lab activated. Mr. Sciuto intubated in the ER.  Patient is currently intubated on ventilator support.  MV: 18 L/min Temp (24hrs), Avg:99.4 F (37.4 C), Min:95 F (35 C), Max:102 F (38.9 C)  Propofol: none  Plans for prognostic EEG today, then readdressing of GOC.  Pt does have a living will.  Currently in CT.    Height: Ht Readings from Last 1 Encounters:  07/19/13 6' 1.62" (1.87 m)    Weight Status:   Wt Readings from Last 1 Encounters:  07/21/13 243 lb 2.7 oz (110.3 kg)    Re-estimated needs:  Kcal (Permissive underfeeding): 8469-6295 Protein: >/=166g Fluid: >2.2  L/day  Skin:  Generalized edema Skin tear  Diet Order: NPO   Intake/Output Summary (Last 24 hours) at 07/21/13 1206 Last data filed at 07/21/13 1000  Gross per 24 hour  Intake   4307 ml  Output    393 ml  Net   3914 ml    Last BM: PTA   Labs:   Recent Labs Lab 07/19/13 1900  07/20/13 0440  07/20/13 1500 07/20/13 2000 07/21/13 0500  NA 134*  < > 133*  < > 133* 132* 134*  K 2.6*  < > 3.0*  < > 3.1* 3.8 4.3  CL 103  < > 102  < > 102 101 103  CO2 12*  < > 12*  < > 17* 18* 19  BUN 7  < > 7  < > 9 10 15   CREATININE 0.57  < > 0.53  < > 0.83 1.07 1.37*  CALCIUM 8.1*  < > 8.4  < > 8.1* 8.2* 7.7*  MG 2.1  --  1.8  --   --   --  1.6  PHOS  --   --  1.1*  --   --   --  2.0*  GLUCOSE 252*  < > 178*  < > 124* 138* 123*  < > = values in this interval not displayed.  CBG (last 3)   Recent Labs  07/21/13 0035 07/21/13 0503 07/21/13 0735  GLUCAP 106* 120* 100*    Scheduled Meds: . ampicillin-sulbactam (UNASYN) IV  3 g Intravenous Q6H  .  antiseptic oral rinse  15 mL Mouth Rinse QID  . artificial tears   Both Eyes Q8H  . aspirin  81 mg Per Tube Daily  . atorvastatin  80 mg Per Tube q1800  . carvedilol  3.125 mg Per Tube BID WC  . chlorhexidine  15 mL Mouth Rinse BID  . heparin  5,000 Units Subcutaneous Q8H  . insulin aspart  2-6 Units Subcutaneous Q4H  . insulin glargine  30 Units Subcutaneous Q24H  . levETIRAcetam  500 mg Intravenous Q12H  . levothyroxine  50 mcg Oral Custom  . [START ON 07/22/2013] levothyroxine  75 mcg Oral Custom  . pantoprazole (PROTONIX) IV  40 mg Intravenous Q24H  . sodium chloride  3 mL Intravenous Q12H  . sodium phosphate  Dextrose 5% IVPB  15 mmol Intravenous Once  . Ticagrelor  90 mg Oral BID    Continuous Infusions: . sodium chloride 50 mL/hr at 07/20/13 0954  . sodium chloride 10 mL/hr (07/19/13 2348)  . amiodarone (NEXTERONE PREMIX) 360 mg/200 mL dextrose 30 mg/hr (07/21/13 0430)  . dextrose    . nitroGLYCERIN    .  norepinephrine (LEVOPHED) Adult infusion 2.5 mcg/min (07/21/13 1015)    Loyce Dys, MS RD LDN Clinical Inpatient Dietitian Pager: [phone] Weekend/After hours pager: [phone]

## 2013-07-21 NOTE — Progress Notes (Signed)
eLink Physician-Brief Progress Note Patient Name: [name] DOB: 12/15/1935 MRN: [id number]  Date of Service  07/21/2013   HPI/Events of Note  Resp alkalosis on ABG with pH 7.5/27.9/76.4/21   eICU Interventions  Plan: Decrease RR on vent from 24 to 18   Intervention Category Major Interventions: Acid-Base disturbance - evaluation and management  DETERDING,ELIZABETH 07/21/2013, 5:27 AM

## 2013-07-21 NOTE — Significant Event (Signed)
Pt appears uncomfortable and has facial twitching >> will give prn versed.  RN notes rales on exam and increased pink, frothy sputum >> will give lasix.  Coralyn Helling, MD 07/21/2013, 3:19 PM

## 2013-07-21 NOTE — Consult Note (Signed)
NEURO HOSPITALIST CONSULT NOTE    Reason for Consult: Prognostication  HPI:                                                                                                                                          [name] is an 77 y.o. male  Per EMS reports, wife, left the room, and he was found unresponsive. About 10 minutes later fire rescue arrived, and move the patient from to the couch to the floor and began CPR. Patient was found to be in V. tach upon EMS arrival. He received one defibrillation. He was noted to be in asystole. He had 3 rounds of epinephrine and CPR for 10 minutes (total 20 minutes down time). Patient had return of spontaneous circulation with sinus tach. Patient was placed on cooling protocol 07/19/13. Rewarmed 07/20/13 at 20:45 (~18 hours ago). Today patient was noted to have myoclonic activity.  CT head today showed no acute stroke or edema. Neurology was asked to see patient for prognostication.   Versed and Fentanyl dripped turned off last night at 6:30 PM.  Received 2 mg versed at 1536 and 100 mcg fentanyl at 1432.   Current abnl labs: WBC 12.8 NA 134  Currently has temperature of 101  Past Medical History  Diagnosis Date  . Prostate cancer     hx of prostate ca  . Hypertension   . Thyroid disease   . Hypothyroidism   . Cough     due to scarring of lung  . GERD (gastroesophageal reflux disease)   . Arthritis   . Complication of anesthesia     sometimes have trouble intubating not last 2 times; limited neck mobility--glidescope used in '08 and '10    Past Surgical History  Procedure Laterality Date  . Lung surgery Right 09    growth on outside of lung  . Tonsillectomy    . Appendectomy    . Shoulder surgery Left     rotator cuff x2  . Back surgery    . Cervical spine surgery    . Knee arthroscopy Right   . Eye surgery Bilateral 10    No family history on file.   Social History:  reports that he quit smoking about 31  years ago. His smoking use included Cigarettes. He has a 45 pack-year smoking history. He does not have any smokeless tobacco history on file. He reports that he does not drink alcohol or use illicit drugs.  No Known Allergies  MEDICATIONS:  Prior to Admission:  Prescriptions prior to admission  Medication Sig Dispense Refill  . acetaminophen (TYLENOL) 325 MG tablet Take 1-2 tablets (325-650 mg total) by mouth every 4 (four) hours as needed.      Marland Kitchen amLODipine (NORVASC) 5 MG tablet Take 10 mg by mouth daily. Hold if SBP <100      . benzonatate (TESSALON) 100 MG capsule Take 100 mg by mouth 2 (two) times daily.      . ciprofloxacin (CIPRO) 500 MG tablet Take 1 tablet (500 mg total) by mouth 2 (two) times daily.  5 tablet  0  . DULoxetine (CYMBALTA) 60 MG capsule Take 60 mg by mouth daily.      Marland Kitchen dutasteride (AVODART) 0.5 MG capsule Take 0.5 mg by mouth daily.      . fluticasone (FLONASE) 50 MCG/ACT nasal spray Place 1 spray into the nose daily as needed for rhinitis.  16 g  1  . guaiFENesin (MUCINEX) 600 MG 12 hr tablet Take 600 mg by mouth 2 (two) times daily.      Marland Kitchen levothyroxine (SYNTHROID, LEVOTHROID) 50 MCG tablet Take 50 mcg by mouth See admin instructions. Take on Mondays, Tuesdays, Wednesdays, Thursdays, and Fridays.      Marland Kitchen levothyroxine (SYNTHROID, LEVOTHROID) 75 MCG tablet Take 75 mcg by mouth See admin instructions. Take on Saturdays and Sundays only.      . metoprolol tartrate (LOPRESSOR) 25 MG tablet Take 25 mg by mouth 2 (two) times daily.      Marland Kitchen omeprazole (PRILOSEC) 20 MG capsule Take 20 mg by mouth 2 (two) times daily.      Marland Kitchen senna (SENOKOT) 8.6 MG TABS Take 2 tablets by mouth at bedtime.      . tamsulosin (FLOMAX) 0.4 MG CAPS capsule Take 1 capsule (0.4 mg total) by mouth daily.  30 capsule  1  . Vitamin D, Ergocalciferol, (DRISDOL) 50000 UNITS CAPS Take 50,000  Units by mouth every 7 (seven) days. Patient tries to take on Mondays or Tuesdays.      Marland Kitchen LORazepam (ATIVAN) 1 MG tablet Take 0.5 tablets (0.5 mg total) by mouth 2 (two) times daily as needed for anxiety.  30 tablet  0   Scheduled: . ampicillin-sulbactam (UNASYN) IV  3 g Intravenous Q6H  . antiseptic oral rinse  15 mL Mouth Rinse QID  . artificial tears   Both Eyes Q8H  . aspirin  81 mg Per Tube Daily  . atorvastatin  80 mg Per Tube q1800  . carvedilol  3.125 mg Per Tube BID WC  . chlorhexidine  15 mL Mouth Rinse BID  . furosemide  40 mg Intravenous Once  . heparin  5,000 Units Subcutaneous Q8H  . insulin aspart  2-6 Units Subcutaneous Q4H  . insulin glargine  30 Units Subcutaneous Q24H  . levETIRAcetam  500 mg Intravenous Q12H  . levothyroxine  50 mcg Oral Custom  . [START ON Aug 20, 2013] levothyroxine  75 mcg Oral Custom  . pantoprazole (PROTONIX) IV  40 mg Intravenous Q24H  . sodium chloride  3 mL Intravenous Q12H  . sodium phosphate  Dextrose 5% IVPB  15 mmol Intravenous Once  . Ticagrelor  90 mg Oral BID     ROS:  History obtained from unobtainable from patient due to mental status     Blood pressure 172/54, pulse 122, temperature 101.1 F (38.4 C), temperature source Core (Comment), resp. rate 28, height 6' 1.62" (1.87 m), weight 110.3 kg (243 lb 2.7 oz), SpO2 89.00%.   Neurologic Examination:                                                                                                      Mental Status: Patient does not respond to verbal stimuli.  Does not respond to deep sternal rub.  Does not follow commands.  No verbalizations noted.  Cranial Nerves: II: patient does not respond confrontation bilaterally, pupils right 2 mm, left 2 mm,and reactive bilaterally III,IV,VI: doll's response minimal bilaterally.  V,VII: corneal reflex  present bilaterally  VIII: patient does not respond to verbal stimuli IX,X: gag reflex present, XI: trapezius strength unable to test bilaterally XII: tongue strength unable to test Motor: Extremities flaccid throughout.  No spontaneous movement noted.  No purposeful movements noted. Sensory: Does not respond to noxious stimuli in any extremity. Deep Tendon Reflexes:  Absent throughout. Plantars: equivocal bilaterally Cerebellar: Unable to perform    Lab Results  Component Value Date/Time   CHOL 134 07/19/2013  4:30 AM    Results for orders placed during the hospital encounter of 07/30/2013 (from the past 48 hour(s))  GLUCOSE, CAPILLARY     Status: Abnormal   Collection Time    07/19/13  3:47 PM      Result Value Range   Glucose-Capillary 245 (*) 70 - 99 mg/dL  TROPONIN I     Status: Abnormal   Collection Time    07/19/13  4:00 PM      Result Value Range   Troponin I 10.60 (*) <0.30 ng/mL   Comment:            Due to the release kinetics of cTnI,     a negative result within the first hours     of the onset of symptoms does not rule out     myocardial infarction with certainty.     If myocardial infarction is still suspected,     repeat the test at appropriate intervals.     REPEATED TO VERIFY     CRITICAL VALUE NOTED.  VALUE IS CONSISTENT WITH PREVIOUSLY REPORTED AND CALLED VALUE.  BASIC METABOLIC PANEL     Status: Abnormal   Collection Time    07/19/13  4:00 PM      Result Value Range   Sodium 138  135 - 145 mEq/L   Potassium <2.0 (*) 3.5 - 5.1 mEq/L   Comment: CRITICAL RESULT CALLED TO, READ BACK BY AND VERIFIED WITH:     L WILSON,RN 1645 07/19/13 D BRADLEY   Chloride 112  96 - 112 mEq/L   CO2 10 (*) 19 - 32 mEq/L   Comment: CRITICAL RESULT CALLED TO, READ BACK BY AND VERIFIED WITH:     L WILSON,RN 1645 07/19/13 D BRADLEY   Glucose, Bld 231 (*) 70 - 99 mg/dL   BUN 6  6 - 23 mg/dL   Creatinine, Ser 1.61 (*) 0.50 - 1.35 mg/dL   Calcium 6.1 (*) 8.4 - 10.5 mg/dL    Comment: CRITICAL RESULT CALLED TO, READ BACK BY AND VERIFIED WITH:     L Ronny Flurry 1645 07/19/13 D BRADLEY   GFR calc non Af Amer >90  >90 mL/min   GFR calc Af Amer >90  >90 mL/min   Comment: (NOTE)     The eGFR has been calculated using the CKD EPI equation.     This calculation has not been validated in all clinical situations.     eGFR's persistently <90 mL/min signify possible Chronic Kidney     Disease.  GLUCOSE, CAPILLARY     Status: Abnormal   Collection Time    07/19/13  4:48 PM      Result Value Range   Glucose-Capillary 223 (*) 70 - 99 mg/dL  POCT I-STAT 3, BLOOD GAS (G3+)     Status: Abnormal   Collection Time    07/19/13  4:58 PM      Result Value Range   pH, Arterial 7.270 (*) 7.350 - 7.450   pCO2 arterial 24.7 (*) 35.0 - 45.0 mmHg   pO2, Arterial 112.0 (*) 80.0 - 100.0 mmHg   Bicarbonate 12.0 (*) 20.0 - 24.0 mEq/L   TCO2 13  0 - 100 mmol/L   O2 Saturation 98.0     Acid-base deficit 15.0 (*) 0.0 - 2.0 mmol/L   Patient temperature 32.8 C     Sample type ARTERIAL    GLUCOSE, CAPILLARY     Status: Abnormal   Collection Time    07/19/13  5:50 PM      Result Value Range   Glucose-Capillary 232 (*) 70 - 99 mg/dL  GLUCOSE, CAPILLARY     Status: Abnormal   Collection Time    07/19/13  6:50 PM      Result Value Range   Glucose-Capillary 236 (*) 70 - 99 mg/dL  BASIC METABOLIC PANEL     Status: Abnormal   Collection Time    07/19/13  7:00 PM      Result Value Range   Sodium 134 (*) 135 - 145 mEq/L   Potassium 2.6 (*) 3.5 - 5.1 mEq/L   Comment: CRITICAL RESULT CALLED TO, READ BACK BY AND VERIFIED WITH:     RN MAY Wardell Heath 07/19/13 2019 Sallyanne Kuster M.   Chloride 103  96 - 112 mEq/L   CO2 12 (*) 19 - 32 mEq/L   Glucose, Bld 252 (*) 70 - 99 mg/dL   BUN 7  6 - 23 mg/dL   Creatinine, Ser 0.96  0.50 - 1.35 mg/dL   Calcium 8.1 (*) 8.4 - 10.5 mg/dL   GFR calc non Af Amer >90  >90 mL/min   GFR calc Af Amer >90  >90 mL/min   Comment: (NOTE)     The eGFR has been calculated using  the CKD EPI equation.     This calculation has not been validated in all clinical situations.     eGFR's persistently <90 mL/min signify possible Chronic Kidney     Disease.  MAGNESIUM     Status: None   Collection Time    07/19/13  7:00 PM      Result Value Range   Magnesium 2.1  1.5 - 2.5 mg/dL  GLUCOSE, CAPILLARY     Status: Abnormal   Collection Time    07/19/13  7:50 PM      Result Value  Range   Glucose-Capillary 214 (*) 70 - 99 mg/dL   Comment 1 Documented in Chart     Comment 2 Notify RN     Comment 3 Glucose Stabilizer    GLUCOSE, CAPILLARY     Status: Abnormal   Collection Time    07/19/13  8:57 PM      Result Value Range   Glucose-Capillary 232 (*) 70 - 99 mg/dL   Comment 1 Documented in Chart     Comment 2 Notify RN     Comment 3 Glucose Stabilizer    GLUCOSE, CAPILLARY     Status: Abnormal   Collection Time    07/19/13 10:04 PM      Result Value Range   Glucose-Capillary 196 (*) 70 - 99 mg/dL   Comment 1 Notify RN     Comment 2 Documented in Chart     Comment 3 Glucose Stabilizer    GLUCOSE, CAPILLARY     Status: Abnormal   Collection Time    07/19/13 10:53 PM      Result Value Range   Glucose-Capillary 212 (*) 70 - 99 mg/dL   Comment 1 Documented in Chart     Comment 2 Notify RN     Comment 3 Glucose Stabilizer    BLOOD GAS, ARTERIAL     Status: Abnormal   Collection Time    07/19/13 11:23 PM      Result Value Range   FIO2 0.50     Delivery systems VENTILATOR     Mode PRESSURE REGULATED VOLUME CONTROL     VT 580     Rate 30.0     Peep/cpap 5.0     pH, Arterial 7.343 (*) 7.350 - 7.450   pCO2 arterial 19.4 (*) 35.0 - 45.0 mmHg   Comment: CRITICAL RESULT CALLED TO, READ BACK BY AND VERIFIED WITH:      MAE ANINON, RN @ 23:28, BY ACOBB RRT, RCP ON 07/19/2013   pO2, Arterial 98.4  80.0 - 100.0 mmHg   Bicarbonate 10.9 (*) 20.0 - 24.0 mEq/L   TCO2 11.6  0 - 100 mmol/L   Acid-base deficit 14.5 (*) 0.0 - 2.0 mmol/L   O2 Saturation 98.3     Patient  temperature 91.4     Collection site A-LINE     Drawn by [id number]     Sample type ARTERIAL DRAW    GLUCOSE, CAPILLARY     Status: Abnormal   Collection Time    07/19/13 11:56 PM      Result Value Range   Glucose-Capillary 224 (*) 70 - 99 mg/dL   Comment 1 Documented in Chart     Comment 2 Notify RN     Comment 3 Glucose Stabilizer    BASIC METABOLIC PANEL     Status: Abnormal   Collection Time    07/20/13 12:00 AM      Result Value Range   Sodium 135  135 - 145 mEq/L   Potassium 2.8 (*) 3.5 - 5.1 mEq/L   Chloride 103  96 - 112 mEq/L   CO2 11 (*) 19 - 32 mEq/L   Glucose, Bld 240 (*) 70 - 99 mg/dL   BUN 7  6 - 23 mg/dL   Creatinine, Ser 0.45  0.50 - 1.35 mg/dL   Calcium 8.2 (*) 8.4 - 10.5 mg/dL   GFR calc non Af Amer >90  >90 mL/min   GFR calc Af Amer >90  >90 mL/min   Comment: (NOTE)  The eGFR has been calculated using the CKD EPI equation.     This calculation has not been validated in all clinical situations.     eGFR's persistently <90 mL/min signify possible Chronic Kidney     Disease.  GLUCOSE, CAPILLARY     Status: Abnormal   Collection Time    07/20/13 12:59 AM      Result Value Range   Glucose-Capillary 176 (*) 70 - 99 mg/dL   Comment 1 Documented in Chart     Comment 2 Notify RN     Comment 3 Glucose Stabilizer    GLUCOSE, CAPILLARY     Status: Abnormal   Collection Time    07/20/13  2:03 AM      Result Value Range   Glucose-Capillary 200 (*) 70 - 99 mg/dL   Comment 1 Notify RN     Comment 2 Documented in Chart     Comment 3 Glucose Stabilizer    GLUCOSE, CAPILLARY     Status: Abnormal   Collection Time    07/20/13  3:05 AM      Result Value Range   Glucose-Capillary 206 (*) 70 - 99 mg/dL   Comment 1 Notify RN     Comment 2 Documented in Chart     Comment 3 Glucose Stabilizer    GLUCOSE, CAPILLARY     Status: Abnormal   Collection Time    07/20/13  3:54 AM      Result Value Range   Glucose-Capillary 151 (*) 70 - 99 mg/dL   Comment 1 Repeat Test     GLUCOSE, CAPILLARY     Status: Abnormal   Collection Time    07/20/13  3:55 AM      Result Value Range   Glucose-Capillary 180 (*) 70 - 99 mg/dL   Comment 1 Documented in Chart     Comment 2 Notify RN     Comment 3 Glucose Stabilizer    BLOOD GAS, ARTERIAL     Status: Abnormal   Collection Time    07/20/13  4:24 AM      Result Value Range   FIO2 0.40     Delivery systems VENTILATOR     Mode PRESSURE REGULATED VOLUME CONTROL     VT 580     Rate 30.0     Peep/cpap 5.0     pH, Arterial 7.430  7.350 - 7.450   pCO2 arterial 18.0 (*) 35.0 - 45.0 mmHg   Comment: CRITICAL RESULT CALLED TO, READ BACK BY AND VERIFIED WITH:      MAE ANINON, RN @ 04:28, BY ACOBB RRT, RCP ON 12/9     CRITICAL RESULT CALLED TO, READ BACK BY AND VERIFIED WITH:      MAE ANINON, RN @ 04:28, BY ACOBB RRT, RCP ON 07/20/2013   pO2, Arterial 63.9 (*) 80.0 - 100.0 mmHg   Bicarbonate 12.4 (*) 20.0 - 24.0 mEq/L   TCO2 13.1  0 - 100 mmol/L   Acid-base deficit 11.9 (*) 0.0 - 2.0 mmol/L   O2 Saturation 96.5     Patient temperature 91.4     Collection site A-LINE     Drawn by [id number]     Sample type ARTERIAL DRAW    BASIC METABOLIC PANEL     Status: Abnormal   Collection Time    07/20/13  4:40 AM      Result Value Range   Sodium 133 (*) 135 - 145 mEq/L   Potassium 3.0 (*) 3.5 -  5.1 mEq/L   Chloride 102  96 - 112 mEq/L   CO2 12 (*) 19 - 32 mEq/L   Glucose, Bld 178 (*) 70 - 99 mg/dL   BUN 7  6 - 23 mg/dL   Creatinine, Ser 4.54  0.50 - 1.35 mg/dL   Calcium 8.4  8.4 - 09.8 mg/dL   GFR calc non Af Amer >90  >90 mL/min   GFR calc Af Amer >90  >90 mL/min   Comment: (NOTE)     The eGFR has been calculated using the CKD EPI equation.     This calculation has not been validated in all clinical situations.     eGFR's persistently <90 mL/min signify possible Chronic Kidney     Disease.  CBC     Status: Abnormal   Collection Time    07/20/13  4:40 AM      Result Value Range   WBC 12.1 (*) 4.0 - 10.5 K/uL   RBC 4.23   4.22 - 5.81 MIL/uL   Hemoglobin 12.0 (*) 13.0 - 17.0 g/dL   HCT 11.9 (*) 14.7 - 82.9 %   MCV 84.2  78.0 - 100.0 fL   MCH 28.4  26.0 - 34.0 pg   MCHC 33.7  30.0 - 36.0 g/dL   RDW 56.2  13.0 - 86.5 %   Platelets 255  150 - 400 K/uL  MAGNESIUM     Status: None   Collection Time    07/20/13  4:40 AM      Result Value Range   Magnesium 1.8  1.5 - 2.5 mg/dL  PHOSPHORUS     Status: Abnormal   Collection Time    07/20/13  4:40 AM      Result Value Range   Phosphorus 1.1 (*) 2.3 - 4.6 mg/dL  GLUCOSE, CAPILLARY     Status: Abnormal   Collection Time    07/20/13  4:54 AM      Result Value Range   Glucose-Capillary 156 (*) 70 - 99 mg/dL   Comment 1 Notify RN     Comment 2 Documented in Chart    GLUCOSE, CAPILLARY     Status: Abnormal   Collection Time    07/20/13  5:59 AM      Result Value Range   Glucose-Capillary 131 (*) 70 - 99 mg/dL  GLUCOSE, CAPILLARY     Status: Abnormal   Collection Time    07/20/13  6:50 AM      Result Value Range   Glucose-Capillary 111 (*) 70 - 99 mg/dL   Comment 1 Notify RN     Comment 2 Documented in Chart     Comment 3 Glucose Stabilizer    BASIC METABOLIC PANEL     Status: Abnormal   Collection Time    07/20/13  7:35 AM      Result Value Range   Sodium 136  135 - 145 mEq/L   Potassium 2.8 (*) 3.5 - 5.1 mEq/L   Chloride 104  96 - 112 mEq/L   CO2 14 (*) 19 - 32 mEq/L   Glucose, Bld 138 (*) 70 - 99 mg/dL   BUN 7  6 - 23 mg/dL   Creatinine, Ser 7.84  0.50 - 1.35 mg/dL   Calcium 8.6  8.4 - 69.6 mg/dL   GFR calc non Af Amer >90  >90 mL/min   GFR calc Af Amer >90  >90 mL/min   Comment: (NOTE)     The eGFR has been calculated using  the CKD EPI equation.     This calculation has not been validated in all clinical situations.     eGFR's persistently <90 mL/min signify possible Chronic Kidney     Disease.  GLUCOSE, CAPILLARY     Status: Abnormal   Collection Time    07/20/13  7:53 AM      Result Value Range   Glucose-Capillary 135 (*) 70 - 99 mg/dL   GLUCOSE, CAPILLARY     Status: Abnormal   Collection Time    07/20/13  8:55 AM      Result Value Range   Glucose-Capillary 119 (*) 70 - 99 mg/dL  GLUCOSE, CAPILLARY     Status: Abnormal   Collection Time    07/20/13  9:52 AM      Result Value Range   Glucose-Capillary 101 (*) 70 - 99 mg/dL  CORTISOL     Status: None   Collection Time    07/20/13 10:00 AM      Result Value Range   Cortisol, Plasma 57.0     Comment: (NOTE)     AM:  4.3 - 22.4 ug/dL     PM:  3.1 - 16.1 ug/dL     Performed at [facility]  GLUCOSE, CAPILLARY     Status: None   Collection Time    07/20/13 10:55 AM      Result Value Range   Glucose-Capillary 88  70 - 99 mg/dL  BASIC METABOLIC PANEL     Status: Abnormal   Collection Time    07/20/13 11:30 AM      Result Value Range   Sodium 135  135 - 145 mEq/L   Potassium 3.2 (*) 3.5 - 5.1 mEq/L   Chloride 105  96 - 112 mEq/L   CO2 17 (*) 19 - 32 mEq/L   Glucose, Bld 88  70 - 99 mg/dL   BUN 8  6 - 23 mg/dL   Creatinine, Ser 0.96  0.50 - 1.35 mg/dL   Calcium 8.4  8.4 - 04.5 mg/dL   GFR calc non Af Amer >90  >90 mL/min   GFR calc Af Amer >90  >90 mL/min   Comment: (NOTE)     The eGFR has been calculated using the CKD EPI equation.     This calculation has not been validated in all clinical situations.     eGFR's persistently <90 mL/min signify possible Chronic Kidney     Disease.  GLUCOSE, CAPILLARY     Status: None   Collection Time    07/20/13 11:49 AM      Result Value Range   Glucose-Capillary 71  70 - 99 mg/dL  GLUCOSE, CAPILLARY     Status: None   Collection Time    07/20/13 12:53 PM      Result Value Range   Glucose-Capillary 82  70 - 99 mg/dL  GLUCOSE, CAPILLARY     Status: None   Collection Time    07/20/13  1:55 PM      Result Value Range   Glucose-Capillary 86  70 - 99 mg/dL  GLUCOSE, CAPILLARY     Status: None   Collection Time    07/20/13  2:54 PM      Result Value Range   Glucose-Capillary 88  70 - 99 mg/dL  BASIC  METABOLIC PANEL     Status: Abnormal   Collection Time    07/20/13  3:00 PM      Result Value Range   Sodium  133 (*) 135 - 145 mEq/L   Potassium 3.1 (*) 3.5 - 5.1 mEq/L   Chloride 102  96 - 112 mEq/L   CO2 17 (*) 19 - 32 mEq/L   Glucose, Bld 124 (*) 70 - 99 mg/dL   BUN 9  6 - 23 mg/dL   Creatinine, Ser 1.61  0.50 - 1.35 mg/dL   Calcium 8.1 (*) 8.4 - 10.5 mg/dL   GFR calc non Af Amer 85 (*) >90 mL/min   GFR calc Af Amer >90  >90 mL/min   Comment: (NOTE)     The eGFR has been calculated using the CKD EPI equation.     This calculation has not been validated in all clinical situations.     eGFR's persistently <90 mL/min signify possible Chronic Kidney     Disease.  GLUCOSE, CAPILLARY     Status: Abnormal   Collection Time    07/20/13  3:58 PM      Result Value Range   Glucose-Capillary 103 (*) 70 - 99 mg/dL  GLUCOSE, CAPILLARY     Status: Abnormal   Collection Time    07/20/13  5:00 PM      Result Value Range   Glucose-Capillary 103 (*) 70 - 99 mg/dL  GLUCOSE, CAPILLARY     Status: None   Collection Time    07/20/13  5:56 PM      Result Value Range   Glucose-Capillary 93  70 - 99 mg/dL  BASIC METABOLIC PANEL     Status: Abnormal   Collection Time    07/20/13  8:00 PM      Result Value Range   Sodium 132 (*) 135 - 145 mEq/L   Potassium 3.8  3.5 - 5.1 mEq/L   Chloride 101  96 - 112 mEq/L   CO2 18 (*) 19 - 32 mEq/L   Glucose, Bld 138 (*) 70 - 99 mg/dL   BUN 10  6 - 23 mg/dL   Creatinine, Ser 0.96  0.50 - 1.35 mg/dL   Calcium 8.2 (*) 8.4 - 10.5 mg/dL   GFR calc non Af Amer 66 (*) >90 mL/min   GFR calc Af Amer 77 (*) >90 mL/min   Comment: (NOTE)     The eGFR has been calculated using the CKD EPI equation.     This calculation has not been validated in all clinical situations.     eGFR's persistently <90 mL/min signify possible Chronic Kidney     Disease.  GLUCOSE, CAPILLARY     Status: Abnormal   Collection Time    07/20/13  8:09 PM      Result Value Range    Glucose-Capillary 127 (*) 70 - 99 mg/dL  GLUCOSE, CAPILLARY     Status: Abnormal   Collection Time    07/21/13 12:35 AM      Result Value Range   Glucose-Capillary 106 (*) 70 - 99 mg/dL  BLOOD GAS, ARTERIAL     Status: Abnormal   Collection Time    07/21/13  4:54 AM      Result Value Range   FIO2 0.40     Delivery systems VENTILATOR     Mode PRESSURE REGULATED VOLUME CONTROL     VT 580     Rate 24     Peep/cpap 5.0     pH, Arterial 7.501 (*) 7.350 - 7.450   pCO2 arterial 27.9 (*) 35.0 - 45.0 mmHg   pO2, Arterial 76.4 (*) 80.0 - 100.0 mmHg   Bicarbonate  21.2  20.0 - 24.0 mEq/L   TCO2 21.9  0 - 100 mmol/L   Acid-base deficit 1.3  0.0 - 2.0 mmol/L   O2 Saturation 95.3     Patient temperature 102.2     Collection site A-LINE     Drawn by [id number]     Sample type ARTERIAL DRAW    CBC     Status: Abnormal   Collection Time    07/21/13  5:00 AM      Result Value Range   WBC 12.8 (*) 4.0 - 10.5 K/uL   RBC 3.41 (*) 4.22 - 5.81 MIL/uL   Hemoglobin 9.6 (*) 13.0 - 17.0 g/dL   Comment: DELTA CHECK NOTED     REPEATED TO VERIFY   HCT 28.4 (*) 39.0 - 52.0 %   MCV 83.3  78.0 - 100.0 fL   MCH 28.2  26.0 - 34.0 pg   MCHC 33.8  30.0 - 36.0 g/dL   RDW 21.3  08.6 - 57.8 %   Platelets 212  150 - 400 K/uL  MAGNESIUM     Status: None   Collection Time    07/21/13  5:00 AM      Result Value Range   Magnesium 1.6  1.5 - 2.5 mg/dL  PHOSPHORUS     Status: Abnormal   Collection Time    07/21/13  5:00 AM      Result Value Range   Phosphorus 2.0 (*) 2.3 - 4.6 mg/dL  BASIC METABOLIC PANEL     Status: Abnormal   Collection Time    07/21/13  5:00 AM      Result Value Range   Sodium 134 (*) 135 - 145 mEq/L   Potassium 4.3  3.5 - 5.1 mEq/L   Chloride 103  96 - 112 mEq/L   CO2 19  19 - 32 mEq/L   Glucose, Bld 123 (*) 70 - 99 mg/dL   BUN 15  6 - 23 mg/dL   Creatinine, Ser 4.69 (*) 0.50 - 1.35 mg/dL   Calcium 7.7 (*) 8.4 - 10.5 mg/dL   GFR calc non Af Amer 49 (*) >90 mL/min   GFR calc Af Amer  57 (*) >90 mL/min   Comment: (NOTE)     The eGFR has been calculated using the CKD EPI equation.     This calculation has not been validated in all clinical situations.     eGFR's persistently <90 mL/min signify possible Chronic Kidney     Disease.  GLUCOSE, CAPILLARY     Status: Abnormal   Collection Time    07/21/13  5:03 AM      Result Value Range   Glucose-Capillary 120 (*) 70 - 99 mg/dL  GLUCOSE, CAPILLARY     Status: Abnormal   Collection Time    07/21/13  7:35 AM      Result Value Range   Glucose-Capillary 100 (*) 70 - 99 mg/dL  URINALYSIS, ROUTINE W REFLEX MICROSCOPIC     Status: Abnormal   Collection Time    07/21/13 10:32 AM      Result Value Range   Color, Urine AMBER (*) YELLOW   Comment: BIOCHEMICALS MAY BE AFFECTED BY COLOR   APPearance CLOUDY (*) CLEAR   Specific Gravity, Urine 1.036 (*) 1.005 - 1.030   pH 5.5  5.0 - 8.0   Glucose, UA NEGATIVE  NEGATIVE mg/dL   Hgb urine dipstick MODERATE (*) NEGATIVE   Bilirubin Urine SMALL (*) NEGATIVE   Ketones, ur 15 (*)  NEGATIVE mg/dL   Protein, ur 409 (*) NEGATIVE mg/dL   Urobilinogen, UA 0.2  0.0 - 1.0 mg/dL   Nitrite NEGATIVE  NEGATIVE   Leukocytes, UA SMALL (*) NEGATIVE  URINE MICROSCOPIC-ADD ON     Status: Abnormal   Collection Time    07/21/13 10:32 AM      Result Value Range   Squamous Epithelial / LPF RARE  RARE   WBC, UA 3-6  <3 WBC/hpf   RBC / HPF 7-10  <3 RBC/hpf   Bacteria, UA FEW (*) RARE   Casts HYALINE CASTS (*) NEGATIVE   Urine-Other MUCOUS PRESENT    POCT I-STAT 3, BLOOD GAS (G3+)     Status: Abnormal   Collection Time    07/21/13 10:45 AM      Result Value Range   pH, Arterial 7.449  7.350 - 7.450   pCO2 arterial 29.8 (*) 35.0 - 45.0 mmHg   pO2, Arterial 57.0 (*) 80.0 - 100.0 mmHg   Bicarbonate 20.7  20.0 - 24.0 mEq/L   TCO2 22  0 - 100 mmol/L   O2 Saturation 91.0     Acid-base deficit 3.0 (*) 0.0 - 2.0 mmol/L   Patient temperature 98.8 F     Collection site RADIAL, ALLEN'S TEST ACCEPTABLE      Drawn by Operator     Sample type ARTERIAL    GLUCOSE, CAPILLARY     Status: None   Collection Time    07/21/13 11:47 AM      Result Value Range   Glucose-Capillary 84  70 - 99 mg/dL  POCT I-STAT 3, BLOOD GAS (G3+)     Status: Abnormal   Collection Time    07/21/13  3:17 PM      Result Value Range   pH, Arterial 7.406  7.350 - 7.450   pCO2 arterial 31.5 (*) 35.0 - 45.0 mmHg   pO2, Arterial 57.0 (*) 80.0 - 100.0 mmHg   Bicarbonate 19.5 (*) 20.0 - 24.0 mEq/L   TCO2 20  0 - 100 mmol/L   O2 Saturation 87.0     Acid-base deficit 4.0 (*) 0.0 - 2.0 mmol/L   Patient temperature 38.7 C     Collection site RADIAL, ALLEN'S TEST ACCEPTABLE     Drawn by Operator     Sample type ARTERIAL      Ct Head Wo Contrast  07/21/2013   CLINICAL DATA:  Intubated, seizure  EXAM: CT HEAD WITHOUT CONTRAST  TECHNIQUE: Contiguous axial images were obtained from the base of the skull through the vertex without intravenous contrast.  COMPARISON:  05/24/2013  FINDINGS: Endotracheal tube partly visualized. Right maxillary sinus air-fluid level is present. Orbits are unremarkable. No skull fracture. Mild cortical volume loss noted with proportional ventricular prominence. Areas of periventricular white matter hypodensity are most compatible with small vessel ischemic change. No acute hemorrhage, infarct, or mass lesion is identified. No midline shift. Hypodensity in the region of the pons is most likely related to streak artifact from the skull base.  IMPRESSION: Chronic changes as above without acute intracranial finding.   Electronically Signed   By: Christiana Pellant M.D.   On: 07/21/2013 11:48   Dg Chest Port 1 View  07/21/2013   CLINICAL DATA:  Acute respiratory failure.  EXAM: PORTABLE CHEST - 1 VIEW  COMPARISON:  07/20/2013  FINDINGS: Endotracheal tube, NG tube, and central line in good position. There is a new small to moderate left pleural effusion. Small right effusion persists. There is a  hazy infiltrate in  the right perihilar region extend into the upper lobe.  IMPRESSION: New left pleural effusion. Hazy infiltrate at the superior aspect of the right hilum.   Electronically Signed   By: Geanie Cooley M.D.   On: 07/21/2013 07:18   Dg Chest Port 1 View  07/20/2013   CLINICAL DATA:  Endotracheal tube evaluation.  EXAM: PORTABLE CHEST - 1 VIEW  COMPARISON:  07/19/2013  FINDINGS: Endotracheal tube is appreciated with tip crossing 1 cm below the clavicles and 4.3 cm above the carina. A right-sided internal jugular catheter is appreciated with tip projecting in the region of superior vena cava. There is blunting left costophrenic angle. No focal regions of consolidation identified. Mild prominence of interstitial markings appreciated. Bilateral perihilar densities appreciated right greater than left. NG tube is seen with tip off the view of this study. Visualized osseous structures unremarkable.  IMPRESSION: 1. Endotracheal tube 4.3 cm above the carina. Remaining support lines and tubes as described above. 2. Blunted right costophrenic angle small effusion versus chronic scarring 3. Pulmonary vascular congestion with bilateral perihilar densities.   Electronically Signed   By: Salome Holmes M.D.   On: 07/20/2013 09:11    Assessment and plan per attending neurologist  Felicie Morn PA-C [facility] [phone]  07/21/2013, 3:27 PM   Assessment/Plan:  77 YO male with cardiac arrest.  Total of 20 minutes down time prior to ROSC.  Patient underwent cooling protocol and rewarmed 07/20/13 at 20:45.  Currently patient shows intact corneal, pupillary and gag reflex.  He is demonstrating repetitive biting motions with his mouth but no other movement. All sedatives were turned off on the evening of 07/20/13.  Felicie Morn PA-C [facility]   Severe encephalopathy secondary to diffuse anoxic brain injury. Patient has intact brainstem reflexes remaining. He is also currently and generalized  nonconvulsive status epilepticus.  Plan: 1. Ativan 2 mg IV, once. 2. Dilantin 1250 mg IV loading dose followed by 100 mg IV every 8 hours for maintenance. 3. Continuous overnight EEG monitoring with simultaneous video recording. 4. Additional anticonvulsant drugs as needed for management of status epilepticus.  Venetia Maxon M.D. [facility] [phone]  We will continue to follow this patient very closely with you.

## 2013-07-21 NOTE — Progress Notes (Signed)
I have had extensive discussions with family wife. We discussed patients current circumstances and organ failures. We also discussed patient's prior wishes and ad under circumstances such as this. Family has decided to NOT perform resuscitation if arrest but to continue current medical support for now. eeg and ct , if poor prognosis, then comfort in am  Mcarthur Rossetti. Tyson Alias, MD, FACP Pgr: [phone] [facility]

## 2013-07-21 NOTE — Progress Notes (Addendum)
**Note De-Identified [name] Obfuscation** RT note: sputum collected and sent to lab; transported patient to CT patient tolerated well

## 2013-07-21 NOTE — Progress Notes (Signed)
Subjective:  Intubated, eyes open, not responsive  Objective:  Vital Signs in the last 24 hours: Temp:  [92.5 F (33.6 C)-101.7 F (38.7 C)] 101.7 F (38.7 C) (12/10 0700) Pulse Rate:  [77-117] 105 (12/10 0700) Resp:  [0-34] 31 (12/10 0700) BP: (108-142)/(52-71) 115/60 mmHg (12/10 0405) SpO2:  [97 %-100 %] 100 % (12/10 0700) Arterial Line BP: (94-181)/(51-103) 119/57 mmHg (12/10 0700) FiO2 (%):  [40 %] 40 % (12/10 0405) Weight:  [243 lb 2.7 oz (110.3 kg)] 243 lb 2.7 oz (110.3 kg) (12/10 0500)  Intake/Output from previous day:  Intake/Output Summary (Last 24 hours) at 07/21/13 4098 Last data filed at 07/21/13 0700  Gross per 24 hour  Intake 4713.01 ml  Output    448 ml  Net 4265.01 ml    Physical Exam: General appearance: intubated, awake, not responsive, exopthalmic Lungs: diffuse wheezing, rhonchi Heart: regular rate and rhythm Extremities: Rt groin without hematoma   Rate: 114  Rhythm: sinus tachycardia  Lab Results:  Recent Labs  07/20/13 0440 07/21/13 0500  WBC 12.1* 12.8*  HGB 12.0* 9.6*  PLT 255 212    Recent Labs  07/20/13 2000 07/21/13 0500  NA 132* 134*  K 3.8 4.3  CL 101 103  CO2 18* 19  GLUCOSE 138* 123*  BUN 10 15  CREATININE 1.07 1.37*    Recent Labs  07/19/13 1230 07/19/13 1600  TROPONINI 13.70* 10.60*    Recent Labs  07/19/13 1230  INR 1.19    Imaging: Dg Chest Port 1 View  07/21/2013   CLINICAL DATA:  Acute respiratory failure.  EXAM: PORTABLE CHEST - 1 VIEW  COMPARISON:  07/20/2013  FINDINGS: Endotracheal tube, NG tube, and central line in good position. There is a new small to moderate left pleural effusion. Small right effusion persists. There is a hazy infiltrate in the right perihilar region extend into the upper lobe.  IMPRESSION: New left pleural effusion. Hazy infiltrate at the superior aspect of the right hilum.   Electronically Signed   By: Geanie Cooley M.D.   On: 07/21/2013 07:18     Cardiac Studies: 2D- - Left  ventricle: Wall thickness was increased in a patternof mild LVH. There was mild concentric hypertrophy. Systolic function was normal. The estimated ejection fraction was in the range of 50% to 55%. Doppler parameters are consistent with abnormal left ventricular relaxation (grade 1 diastolic dysfunction). Doppler parameters are consistent with elevated mean left atrial filling pressure. - Aortic valve: Mildly calcified annulus. Trileaflet; mildly thickened, mildly calcified leaflets. - Mitral valve: Mild to moderately calcified annulus. and submitral apparatus.Mildly thickened leaflets . Prolapse of the anterior leaflet cannot be excluded. Transthoracic echocardiography. M-mode, complete 2D, spectral Doppler, and color Doppler. Height: Height: 185.4cm. Height: 73in. Weight: Weight: 105kg. Weight: 231lb. Body mass index: BMI: 30.5kg/m^2. Body surface area: BSA: 2.72m^2. Blood pressure: 98/43. Patient status: Inpatient. Location: ICU/CCU    Assessment/Plan:   Principal Problem:   Cardiac arrest 12/7/14pm -out of hospital with CPR, [place] In setting of Anterior STEMI Active Problems:   Acute respiratory failure   CAD- urgent DES to mid LAD (STEMI) 07/19/13   Cardiogenic shock   HTN (hypertension)   Hypothyroidism   Hyperglycemia   H/O prostate cancer   PLAN: MD to see, currently on multiple pressors. I/O + > 6L.   Corine Shelter PA-C [facility] [phone] 07/21/2013, 8:22 AM  Now off of sedation & unresponsive. Asynchronous with vent; frothing @ mouth.  BP stable & tachycardic in 110s - weaning Levophed.  Difficult to call this Cardiogenic Shock with "normal" EF.   Started low dose Coreg yesterday while on pressors.  Not difficult to explain VF arrest with LAD occlusion,but mid LAD lesion with minimal drop in EF is unlikely to cause cardiogenic shock.   Now febrile with elevated WBC; lung exam is extremely coarse & rhonchorus - ? Aspiration PNA vs. VAP ; ? If pressor requirement is  more related to SIRS than cardiogenic; Finall have CVP ~ 10.  ? Siezure activity - given Ativan last pm.  D/W pccm - ? Timing of Neurology c/s? Had ~10 min down-time before CPRP initiated.    Continue ASA/Brilinta & statin Once off pressors, can convert Amiodarone to Per OG tube.  UOP notably diminished - he did have a significant contrast load along with prolonged per-arrest ischemia.  Not anuric, but is oliguric.  Cr trending up.  ON bicarb & NS gtt - with alkalosis, may want to d/c bicarb.  Appreciate continued care by PCCM.   Cardiac issues are essentially back-seat for now.  Marykay Lex, MD

## 2013-07-22 ENCOUNTER — Inpatient Hospital Stay (HOSPITAL_COMMUNITY): Payer: Medicare Other

## 2013-07-22 DIAGNOSIS — G931 Anoxic brain damage, not elsewhere classified: Secondary | ICD-10-CM

## 2013-07-22 DIAGNOSIS — R569 Unspecified convulsions: Secondary | ICD-10-CM

## 2013-07-22 DIAGNOSIS — I2109 ST elevation (STEMI) myocardial infarction involving other coronary artery of anterior wall: Principal | ICD-10-CM

## 2013-07-22 LAB — BASIC METABOLIC PANEL
BUN: 25 mg/dL — ABNORMAL HIGH (ref 6–23)
CO2: 17 mEq/L — ABNORMAL LOW (ref 19–32)
Chloride: 96 mEq/L (ref 96–112)
Creatinine, Ser: 1.44 mg/dL — ABNORMAL HIGH (ref 0.50–1.35)
Glucose, Bld: 242 mg/dL — ABNORMAL HIGH (ref 70–99)
Potassium: 4.8 mEq/L (ref 3.5–5.1)
Sodium: 129 mEq/L — ABNORMAL LOW (ref 135–145)

## 2013-07-22 LAB — GLUCOSE, CAPILLARY
Glucose-Capillary: 176 mg/dL — ABNORMAL HIGH (ref 70–99)
Glucose-Capillary: 188 mg/dL — ABNORMAL HIGH (ref 70–99)
Glucose-Capillary: 178 mg/dL — ABNORMAL HIGH (ref 70–99)
Glucose-Capillary: 192 mg/dL — ABNORMAL HIGH (ref 70–99)
Glucose-Capillary: 127 mg/dL — ABNORMAL HIGH (ref 70–99)

## 2013-07-22 LAB — CBC
HCT: 29.8 % — ABNORMAL LOW (ref 39.0–52.0)
MCV: 87.9 fL (ref 78.0–100.0)
RBC: 3.39 MIL/uL — ABNORMAL LOW (ref 4.22–5.81)
RDW: 15.6 % — ABNORMAL HIGH (ref 11.5–15.5)
WBC: 10.3 10*3/uL (ref 4.0–10.5)

## 2013-07-22 MED ORDER — PIPERACILLIN-TAZOBACTAM 3.375 G IVPB
3.3750 g | Freq: Three times a day (TID) | INTRAVENOUS | Status: DC
  Administered 2013-07-22 – 2013-07-24 (×6): 3.375 g via INTRAVENOUS
  Filled 2013-07-22 (×8): qty 50

## 2013-07-22 MED ORDER — PANTOPRAZOLE SODIUM 40 MG PO PACK
40.0000 mg | PACK | Freq: Every day | ORAL | Status: DC
  Administered 2013-07-22 – 2013-07-23 (×2): 40 mg
  Filled 2013-07-22 (×3): qty 20

## 2013-07-22 MED ORDER — VANCOMYCIN HCL 10 G IV SOLR
1500.0000 mg | INTRAVENOUS | Status: DC
  Administered 2013-07-23: 1500 mg via INTRAVENOUS
  Filled 2013-07-22 (×2): qty 1500

## 2013-07-22 MED ORDER — VANCOMYCIN HCL 10 G IV SOLR
2000.0000 mg | Freq: Once | INTRAVENOUS | Status: AC
  Administered 2013-07-22: 2000 mg via INTRAVENOUS
  Filled 2013-07-22: qty 2000

## 2013-07-22 MED ORDER — PHENYLEPHRINE HCL 10 MG/ML IJ SOLN
30.0000 ug/min | INTRAMUSCULAR | Status: DC
  Administered 2013-07-22: 200 ug/min via INTRAVENOUS
  Administered 2013-07-22: 150 ug/min via INTRAVENOUS
  Administered 2013-07-22: 200 ug/min via INTRAVENOUS
  Filled 2013-07-22 (×3): qty 4

## 2013-07-22 MED ORDER — PHENYLEPHRINE HCL 10 MG/ML IJ SOLN
30.0000 ug/min | INTRAVENOUS | Status: DC
  Administered 2013-07-22: 40 ug/min via INTRAVENOUS
  Filled 2013-07-22: qty 1

## 2013-07-22 NOTE — Progress Notes (Signed)
Subjective: Intubated. No responding to commands.   Objective: Vital signs in last 24 hours: Temp:  [97.2 F (36.2 C)-102.6 F (39.2 C)] 97.2 F (36.2 C) (12/11 0700) Pulse Rate:  [71-124] 71 (12/11 0700) Resp:  [12-33] 12 (12/11 0700) BP: (82-172)/(42-68) 104/63 mmHg (12/11 0300) SpO2:  [88 %-98 %] 96 % (12/11 0700) Arterial Line BP: (60-191)/(32-67) 135/56 mmHg (12/11 0700) FiO2 (%):  [40 %-100 %] 100 % (12/11 0400) Weight:  [250 lb (113.4 kg)] 250 lb (113.4 kg) (12/11 0500)    Intake/Output from previous day: 12/10 0701 - 12/11 0700 In: 6083.5 [I.V.:3488.5; NG/GT:330; IV Piggyback:2265] Out: 950 [Urine:950] Intake/Output this shift:    Medications Current Facility-Administered Medications  Medication Dose Route Frequency Provider Last Rate Last Dose  . 0.9 %  sodium chloride infusion  250 mL Intravenous PRN Marykay Lex, MD 20 mL/hr at 07/21/13 2300 250 mL at 07/21/13 2300  . 0.9 %  sodium chloride infusion   Intravenous Continuous Alyson Reedy, MD 10 mL/hr at 07/21/13 1435    . acetaminophen (TYLENOL) solution 650 mg  650 mg Per Tube Q4H PRN Nelda Bucks, MD      . amiodarone (NEXTERONE PREMIX) 360 mg/200 mL dextrose IV infusion  30 mg/hr Intravenous Continuous Marykay Lex, MD 16.7 mL/hr at 07/22/13 0644 30 mg/hr at 07/22/13 0644  . Ampicillin-Sulbactam (UNASYN) 3 g in sodium chloride 0.9 % 100 mL IVPB  3 g Intravenous Q6H Lennon Alstrom, RPH   3 g at 07/22/13 0452  . antiseptic oral rinse (BIOTENE) solution 15 mL  15 mL Mouth Rinse QID Lupita Leash, MD   15 mL at 07/22/13 0400  . artificial tears (LACRILUBE) ophthalmic ointment   Both Eyes Q8H Dorise Hiss Deterding, MD      . aspirin chewable tablet 81 mg  81 mg Per Tube Daily Marykay Lex, MD   81 mg at 07/21/13 1610  . atorvastatin (LIPITOR) tablet 80 mg  80 mg Per Tube q1800 Marykay Lex, MD   80 mg at 07/21/13 1727  . carvedilol (COREG) tablet 3.125 mg  3.125 mg Per Tube BID WC Marykay Lex, MD   3.125 mg at 07/21/13 1727  . chlorhexidine (PERIDEX) 0.12 % solution 15 mL  15 mL Mouth Rinse BID Lupita Leash, MD   15 mL at 07/21/13 2029  . dextrose 10 % infusion   Intravenous Continuous PRN Alyson Reedy, MD 40 mL/hr at 07/22/13 [phone]    . fentaNYL (SUBLIMAZE) injection 25-100 mcg  25-100 mcg Intravenous Q2H PRN Nelda Bucks, MD   100 mcg at 07/21/13 1639  . heparin injection 5,000 Units  5,000 Units Subcutaneous Q8H Lupita Leash, MD   5,000 Units at 07/22/13 5409  . hydrALAZINE (APRESOLINE) injection 20 mg  20 mg Intravenous Q4H PRN Marykay Lex, MD   20 mg at 07/19/13 0521  . insulin aspart (novoLOG) injection 2-6 Units  2-6 Units Subcutaneous Q4H Alyson Reedy, MD   4 Units at 07/22/13 0449  . insulin glargine (LANTUS) injection 30 Units  30 Units Subcutaneous Q24H Alyson Reedy, MD   30 Units at 07/21/13 1818  . levETIRAcetam (KEPPRA) 500 mg in sodium chloride 0.9 % 100 mL IVPB  500 mg Intravenous Q12H Zigmund Gottron, MD   500 mg at 07/21/13 2300  . levothyroxine (SYNTHROID, LEVOTHROID) tablet 50 mcg  50 mcg Oral Custom Leeann Must, MD   50 mcg at 07/22/13  1610  Melene Muller ON 07/30/2013] levothyroxine (SYNTHROID, LEVOTHROID) tablet 75 mcg  75 mcg Oral Custom Leeann Must, MD      . midazolam (VERSED) injection 2 mg  2 mg Intravenous Q4H PRN Coralyn Helling, MD   2 mg at 07/21/13 1536  . nitroGLYCERIN (NITROSTAT) SL tablet 0.4 mg  0.4 mg Sublingual Q5 Min x 3 PRN Leeann Must, MD      . nitroGLYCERIN 0.2 mg/mL in dextrose 5 % infusion  5 mcg/min Intravenous Titrated Marykay Lex, MD      . norepinephrine (LEVOPHED) 16 mg in dextrose 5 % 250 mL infusion  0.5-50 mcg/min Intravenous Titrated Coralyn Helling, MD 46.9 mL/hr at 07/22/13 0415 50 mcg/min at 07/22/13 0415  . pantoprazole (PROTONIX) injection 40 mg  40 mg Intravenous Q24H Jenelle Mages, MD   40 mg at 07/21/13 0925  . phenylephrine (NEO-SYNEPHRINE) 40 mg in dextrose 5 % 250 mL infusion  30-200 mcg/min  Intravenous Continuous Lupita Leash, MD 75 mL/hr at 07/22/13 0516 200 mcg/min at 07/22/13 0516  . phenytoin (DILANTIN) injection 100 mg  100 mg Intravenous Q8H Charles Stewart   100 mg at 07/22/13 0300  . propofol (DIPRIVAN) 10 mg/ml infusion  5-150 mcg/kg/min Intravenous Titrated Ritta Slot, MD 46.3 mL/hr at 07/22/13 0740 70 mcg/kg/min at 07/22/13 0740  . sodium chloride 0.9 % injection 3 mL  3 mL Intravenous Q12H Marykay Lex, MD   3 mL at 07/21/13 2100  . sodium chloride 0.9 % injection 3 mL  3 mL Intravenous PRN Marykay Lex, MD      . Ticagrelor [facility]) tablet 90 mg  90 mg Oral BID Lupita Leash, MD   90 mg at 07/21/13 2300  . vasopressin (PITRESSIN) 50 Units in sodium chloride 0.9 % 250 mL infusion  0.03 Units/min Intravenous Continuous Coralyn Helling, MD 9 mL/hr at 07/21/13 2225 0.03 Units/min at 07/21/13 2225    PE: General appearance: intubated, not responsive Lungs: course breathsounds bilaterally Heart: regular rate and rhythm Skin: cold distal extremities  Lab Results:   Recent Labs  07/20/13 0440 07/21/13 0500 07/22/13 0450  WBC 12.1* 12.8* 10.3  HGB 12.0* 9.6* 9.6*  HCT 35.6* 28.4* 29.8*  PLT 255 212 207   BMET  Recent Labs  07/20/13 2000 07/21/13 0500 07/22/13 0450  NA 132* 134* 129*  K 3.8 4.3 4.8  CL 101 103 96  CO2 18* 19 17*  GLUCOSE 138* 123* 242*  BUN 10 15 25*  CREATININE 1.07 1.37* 1.44*  CALCIUM 8.2* 7.7* 7.0*   PT/INR  Recent Labs  07/19/13 1230  LABPROT 14.8  INR 1.19   Assessment/Plan  Principal Problem:   Cardiac arrest 12/23/2014pm -out of hospital with CPR, [place] In setting of Anterior STEMI Active Problems:   HTN (hypertension)   Hypothyroidism   Hyperglycemia   Acute respiratory failure   CAD- urgent DES to mid LAD (STEMI) 07/19/13   Cardiogenic shock   H/O prostate cancer   Aspiration pneumonia   Seizure   Anoxic brain injury   Status epilepticus  Plan: Day 4 post cardiac arrest. S/p DES to mid LAD.  Pt remains intubated. Not responding to commands. Still on pressors. Continue Amiodarone, ASA, Brilinta and statin. Critical Care managing acute respiratory failure. Neurology also following for seizures. MD to assess and will provide further recommendations.    LOS: 4 days    Brittainy M. Sharol Harness, PA-C 07/22/2013 8:09 AM  I have seen and examined the patient along  with Brittainy M. Sharol Harness, PA-C.  I have reviewed the chart, notes and new data.  I agree with PA's note.  Key new complaints: remains completely unresponsive. Once seizures were controlled, BP has been much more stable and need for pressors seems to be declining Key examination changes: no seizure activity, clear lungs, S3 present Key new findings / data: UO relatively low (<1L/24h, only 300 mL night shift), >5L net positive last 24 h, slight worsening of BUN/creat, marked hyponatremia  PLAN: Cardiogenic shock Nonoliguric acute renal failure Acute ventilator dependent respiratory failure Worry that he is developing post-arrest ATN +/- contrast nephrotoxicity and this will further complicate an already grim situation. Try to wean off pure vasoconstrictors (phenylephrine) and vasopressin. Reduce Dextrose infusion - no longer hypoglycemic. Likelihood of survival without severe disability is extremely low.  Thurmon Fair, MD, [facility] [phone] 07/22/2013, 8:32 AM

## 2013-07-22 NOTE — Progress Notes (Signed)
Downloaded last night's LTM and restarted EEG. Day 2.

## 2013-07-22 NOTE — Progress Notes (Signed)
Subjective: Patient remains unresponsive. Seizure activity controlled with anticonvulsant medications along with IV propofol.  Objective: Current vital signs: BP 104/63  Pulse 69  Temp(Src) 96.6 F (35.9 C) (Core (Comment))  Resp 12  Ht 6' 1.62" (1.87 m)  Wt 113.4 kg (250 lb)  BMI 32.43 kg/m2  SpO2 96%  Neurologic Exam: Intubated and on mechanical ventilation. Patient is currently on 70 mcg per kilogram per minute propofol. He is unresponsive to external stimuli including noxious stimuli. Pupils were small and equal and did not react to light. Extraocular movements were absent with oculocephalic maneuvers. Corneal reflexes absent. No facial weakness noted. Muscle tone was flaccid throughout. Patient had no spontaneous movements and no abnormal posturing. Deep tendon reflexes were absent bilaterally. Plantar responses were mute bilaterally.  EEG is currently showing a burst-suppression pattern of cerebral activity with no signs of seizure activity. This pattern of activity is in part due to to suppression of brain activity with propofol, in an effort to manage status epilepticus.  Medications: I have reviewed the patient's current medications.  Assessment/Plan: Severe diffuse hypoxic encephalopathy secondary to cardiac arrest on 07/21/2013. Status epilepticus is currently controlled with anticonvulsant medications and propofol drip. Prognosis is poor for recovery to a level of independent functioning.  I'm recommending no changes in current management. Propofol will be incrementally reduced as tolerated or seizure control.  We will continue to follow this patient closely with you.  C.R. Roseanne Reno, MD [facility] [phone]  07/22/2013  9:53 AM

## 2013-07-22 NOTE — Progress Notes (Signed)
Informed Dr Tyson Alias of pts new report of postive Blood cultures; ( gram positive cocci in clusters)  Dawson Bills, RN

## 2013-07-22 NOTE — Progress Notes (Signed)
Updated MD nephew per fam requests Meeting with wife extensive Decided on comfort care after daughter arrives Maintain care and escalate reasonable for daughter to arrive if declines in meantime Likely comfort frid evening or sat am  Mcarthur Rossetti. Tyson Alias, MD, FACP Pgr: [phone] [facility]

## 2013-07-22 NOTE — Progress Notes (Signed)
Dr. Craige Cotta notified of pt not tolerating turning or suctioning without significant drop in blood pressure.  Pt remains on 25 mcg neo-synephrine and have been unable to completely wean off neo-synephrine this afternoon.  Orders received.  Will continue to monitor pt closely.

## 2013-07-22 NOTE — Progress Notes (Addendum)
PULMONARY  / CRITICAL CARE MEDICINE CONSULTATION  Name: [name] MRN: [id number] DOB: 1936-06-02    ADMISSION DATE:  07/21/2013  CHIEF COMPLAINT:  Cardiac arrest  BRIEF PATIENT DESCRIPTION: 77 year-old male presenting the following cardiac arrest in the setting of STEMI. Pulmonary critical care medicine consult for ventilator management.  SIGNIFICANT EVENTS / STUDIES:  1. Cardiac arrest 07/21/2013 2. Intubated 07/20/2013 3. Cath 07/19/2013 - Preliminary report intervention done on LAD 4. 12-10 warmed and note rhythmic facial twitching. Frothy secretions 5.  12/9- status on eeg 6. 12/9 ct head neg edema.     LINES / TUBES: 1. RIJ CVC 12/8>>> 2. ET Tube 12/7>>> 3. R radial a-line 12/8>>>  CULTURES: 1. Sputum 12-10>> 2. BCx 2 12-10>> 3. UC 12-10>>   ANTIBIOTICS: 1. 12-10 Unasyn>>  PHYSICAL EXAM  VITAL SIGNS: Temp:  [96.6 F (35.9 C)-102.6 F (39.2 C)] 96.6 F (35.9 C) (12/11 0800) Pulse Rate:  [69-124] 69 (12/11 0822) Resp:  [12-33] 12 (12/11 0822) BP: (82-172)/(42-63) 104/63 mmHg (12/11 0300) SpO2:  [88 %-96 %] 96 % (12/11 0822) Arterial Line BP: (60-191)/(32-67) 139/57 mmHg (12/11 0800) FiO2 (%):  [40 %-100 %] 100 % (12/11 0822) Weight:  [250 lb (113.4 kg)] 250 lb (113.4 kg) (12/11 0500)  HEMODYNAMICS: CVP:  [7 mmHg-16 mmHg] 11 mmHg  VENTILATOR SETTINGS: Vent Mode:  [-] PRVC FiO2 (%):  [40 %-100 %] 100 % Set Rate:  [12 bmp-18 bmp] 12 bmp Vt Set:  [580 mL] 580 mL PEEP:  [5 cmH20] 5 cmH20 Plateau Pressure:  [23 cmH20-26 cmH20] 23 cmH20  INTAKE / OUTPUT: Intake/Output     12/10 0701 - 12/11 0700 12/11 0701 - 12/12 0700   I.V. (mL/kg) 3488.5 (30.8) 253.9 (2.2)   NG/GT 330 30   IV Piggyback 2265    Total Intake(mL/kg) 6083.5 (53.6) 283.9 (2.5)   Urine (mL/kg/hr) 950 (0.3) 135 (0.5)   Total Output 950 135   Net +5133.5 +148.9         PHYSICAL EXAMINATION: General:  Elderly with fixed gaze, frothy sputum Neuro:  Overbreathing vent, facial twitching.  disconjugate gaze . No response to painful stimuli HEENT:  Sclera anicteric, conjunctiva pink, MMM, ETT present Neck:  Supple and trachea midline, (-) LAN or JVD Cardiovascular:  RRR, NS1/S2, (-) MRG Lungs: Coarse  breath sounds bilaterally, copious secretions  Abdomen:  S/NT/ND/(+)BS Musculoskeletal:  (-) C/C/E Skin:  Intact  LABS:  CBC Recent Labs     07/20/13  0440  07/21/13  0500  07/22/13  0450  WBC  12.1*  12.8*  10.3  HGB  12.0*  9.6*  9.6*  HCT  35.6*  28.4*  29.8*  PLT  255  212  207   Coag's Recent Labs     07/19/13  1230  APTT  31  INR  1.19   BMET Recent Labs     07/20/13  2000  07/21/13  0500  07/22/13  0450  NA  132*  134*  129*  K  3.8  4.3  4.8  CL  101  103  96  CO2  18*  19  17*  BUN  10  15  25*  CREATININE  1.07  1.37*  1.44*  GLUCOSE  138*  123*  242*    Electrolytes Recent Labs     07/19/13  1900   07/20/13  0440   07/20/13  2000  07/21/13  0500  07/22/13  0450  CALCIUM  8.1*   < >  8.4   < >  8.2*  7.7*  7.0*  MG  2.1   --   1.8   --    --   1.6   --   PHOS   --    --   1.1*   --    --   2.0*  5.7*   < > = values in this interval not displayed.   Sepsis Markers No results found for this basename: LACTICACIDVEN, PROCALCITON, O2SATVEN,  in the last 72 hours  ABG Recent Labs     07/21/13  0454  07/21/13  1045  07/21/13  1517  PHART  7.501*  7.449  7.406  PCO2ART  27.9*  29.8*  31.5*  PO2ART  76.4*  57.0*  57.0*   Liver Enzymes No results found for this basename: AST, ALT, ALKPHOS, BILITOT, ALBUMIN,  in the last 72 hours  Cardiac Enzymes Recent Labs     07/19/13  1230  07/19/13  1600  TROPONINI  13.70*  10.60*    Glucose Recent Labs     07/21/13  1147  07/21/13  1546  07/21/13  2034  07/22/13  0116  07/22/13  0442  07/22/13  0822  GLUCAP  84  78  100*  176*  188*  178*    Imaging Ct Head Wo Contrast  07/21/2013   CLINICAL DATA:  Intubated, seizure  EXAM: CT HEAD WITHOUT CONTRAST  TECHNIQUE: Contiguous  axial images were obtained from the base of the skull through the vertex without intravenous contrast.  COMPARISON:  05/24/2013  FINDINGS: Endotracheal tube partly visualized. Right maxillary sinus air-fluid level is present. Orbits are unremarkable. No skull fracture. Mild cortical volume loss noted with proportional ventricular prominence. Areas of periventricular white matter hypodensity are most compatible with small vessel ischemic change. No acute hemorrhage, infarct, or mass lesion is identified. No midline shift. Hypodensity in the region of the pons is most likely related to streak artifact from the skull base.  IMPRESSION: Chronic changes as above without acute intracranial finding.   Electronically Signed   By: Christiana Pellant M.D.   On: 07/21/2013 11:48   Dg Chest Port 1 View  07/22/2013   CLINICAL DATA:  Endotracheal tube.  Shortness of breath.  EXAM: PORTABLE CHEST - 1 VIEW  COMPARISON:  07/21/2013  FINDINGS: Endotracheal tube tip projects approximately 5 cm above the carina, unchanged. Right jugular central venous catheter tip overlies the mid to upper SVC, unchanged. Enteric tube courses into the left upper abdomen with its course incompletely imaged, however it appears to loop in the left upper quadrant with tip overlying the expected region of the gastric fundus. The cardiac silhouette remains enlarged. There is worsening right perihilar opacity, which now appears as dense consolidation with air bronchograms. Mild left perihilar opacity is unchanged. Hazy opacity in the left lung base is unchanged and suggestive of a pleural effusion. There is also an unchanged small right pleural effusion.  IMPRESSION: 1. Worsening, dense consolidation in the right perihilar region, concerning for pneumonia or less likely asymmetric edema. 2. Bilateral pleural effusions. 3. Stable appearance of support lines and tubes.   Electronically Signed   By: Sebastian Ache   On: 07/22/2013 07:29   Dg Chest Port 1  View  07/21/2013   CLINICAL DATA:  Acute respiratory failure.  EXAM: PORTABLE CHEST - 1 VIEW  COMPARISON:  07/20/2013  FINDINGS: Endotracheal tube, NG tube, and central line in good position. There is a new small to  moderate left pleural effusion. Small right effusion persists. There is a hazy infiltrate in the right perihilar region extend into the upper lobe.  IMPRESSION: New left pleural effusion. Hazy infiltrate at the superior aspect of the right hilum.   Electronically Signed   By: Geanie Cooley M.D.   On: 07/21/2013 07:18   CXR: see above  ASSESSMENT / PLAN: Principal Problem:   Cardiac arrest 12/7/14pm -out of hospital with CPR, VT In setting of Anterior STEMI Active Problems:   HTN (hypertension)   Hypothyroidism   Hyperglycemia   Acute respiratory failure   CAD- urgent DES to mid LAD (STEMI) 07/19/13   Cardiogenic shock   H/O prostate cancer   Aspiration pneumonia   Seizure   Anoxic brain injury   Status epilepticus  1. Acute respiratory failure: This is most likely secondary to the patient's cardiac arrest. Complicated by aspiration and seizures,   Lung protective ventilation  Maintain current mV  NO SBT today planned with status  2. Cardiac Arrest: This is almost certainly secondary to the patient's acute ST segment elevation myocardial infarction. He is status post intervention.  Hypothermia protocol , warmed 12-10 with szs now  Coronary artery disease/STEMI management per cardiology  Have a need pressors secondary to propofol per neuro likely, but will broaden abx  Stress dose steroids, dc'd  3. Hyperglycemia: This may be a reflection of underlying diabetes versus the acute insult. CBG (last 3)   Recent Labs  07/22/13 0116 07/22/13 0442 07/22/13 0822  GLUCAP 176* 188* 178*      SSI  4. Hypothyrodisim  Check TSH(.582)  5. Renal:  Recent Labs Lab 07/20/13 2000 07/21/13 0500 07/22/13 0450  K 3.8 4.3 4.8    Lab Results  Component Value  Date   CREATININE 1.44* 07/22/2013   CREATININE 1.37* 07/21/2013   CREATININE 1.07 07/20/2013   May need lasix, would kvo, want to avoid Na drop whan abel to avoid d in fluids would do so  6. GI: TF reorder  7. Heme: dvt prevention  8. Neuro: 20 minutes downtime before ROSC.  Head CT neg acute Status - poor prognosis fxnal recovery Ct and EEG of head reviewed Ativan prn Anticonvulsants  Increased sedation to control szs which required increased pressor support. Will meet son and wife as ps has stated that he ouwld not want support under these circumstances  ID Asp PNA unasyn dc Sputum to follow As massive increase pressors will change to zosyn, add vanc   DNR as of 12-10 after discussion with family per Dr. Tyson Alias, Continue at current level for now. Poor prognosis. Ongoing dialogue with family.     Brett Canales Minor ACNP [facility] Pager [phone] till 3 pm If no answer page [phone] 07/22/2013, 9:23 AM  Ccm 35 min I have fully examined this patient and agree with above findings.    And edited in full  Mcarthur Rossetti. Tyson Alias, MD, FACP Pgr: [phone] [facility]

## 2013-07-22 NOTE — Progress Notes (Addendum)
ANTIBIOTIC CONSULT NOTE - INITIAL  Pharmacy Consult:  Vancomycin / Zosyn Indication: Suspected PNA  No Known Allergies  Patient Measurements: Height: 6' 1.62" (187 cm) Weight: 250 lb (113.4 kg) IBW/kg (Calculated) : 81.33  Vital Signs: Temp: 96.1 F (35.6 C) (12/11 1000) Temp src: Core (Comment) (12/11 0000) BP: 104/63 mmHg (12/11 0300) Pulse Rate: 65 (12/11 1000) Intake/Output from previous day: 12/10 0701 - 12/11 0700 In: 6083.5 [I.V.:3488.5; NG/GT:330; IV Piggyback:2265] Out: 950 [Urine:950] Intake/Output from this shift: Total I/O In: 283.9 [I.V.:253.9; NG/GT:30] Out: 135 [Urine:135]  Labs:  Recent Labs  07/20/13 0440  07/20/13 2000 07/21/13 0500 07/22/13 0450  WBC 12.1*  --   --  12.8* 10.3  HGB 12.0*  --   --  9.6* 9.6*  PLT 255  --   --  212 207  CREATININE 0.53  < > 1.07 1.37* 1.44*  < > = values in this interval not displayed. Estimated Creatinine Clearance: 59.9 ml/min (by C-G formula based on Cr of 1.44). No results found for this basename: VANCOTROUGH, Leodis Binet, VANCORANDOM, GENTTROUGH, GENTPEAK, GENTRANDOM, TOBRATROUGH, TOBRAPEAK, TOBRARND, AMIKACINPEAK, AMIKACINTROU, AMIKACIN,  in the last 72 hours   Microbiology: Recent Results (from the past 720 hour(s))  MRSA PCR SCREENING     Status: None   Collection Time    07/19/13  2:52 AM      Result Value Range Status   MRSA by PCR NEGATIVE  NEGATIVE Final   Comment:            The GeneXpert MRSA Assay (FDA     approved for NASAL specimens     only), is one component of a     comprehensive MRSA colonization     surveillance program. It is not     intended to diagnose MRSA     infection nor to guide or     monitor treatment for     MRSA infections.  CULTURE, RESPIRATORY (NON-EXPECTORATED)     Status: None   Collection Time    07/21/13 10:40 AM      Result Value Range Status   Specimen Description TRACHEAL ASPIRATE   Final   Special Requests Normal   Final   Gram Stain     Final   Value: NO WBC  SEEN     NO SQUAMOUS EPITHELIAL CELLS SEEN     ABUNDANT GRAM NEGATIVE RODS     RARE GRAM POSITIVE COCCI IN PAIRS     Performed at [facility]   Culture     Final   Value: ABUNDANT GRAM NEGATIVE RODS     Performed at [facility]   Report Status PENDING   Incomplete  CULTURE, BLOOD (ROUTINE X 2)     Status: None   Collection Time    07/21/13 10:50 AM      Result Value Range Status   Specimen Description BLOOD RIGHT HAND   Final   Special Requests BOTTLES DRAWN AEROBIC ONLY 5CC   Final   Culture  Setup Time     Final   Value: 07/21/2013 16:30     Performed at [facility]   Culture     Final   Value:        BLOOD CULTURE RECEIVED NO GROWTH TO DATE CULTURE WILL BE HELD FOR 5 DAYS BEFORE ISSUING A FINAL NEGATIVE REPORT     Performed at [facility]   Report Status PENDING   Incomplete  CULTURE, BLOOD (ROUTINE X 2)  Status: None   Collection Time    07/21/13 11:00 AM      Result Value Range Status   Specimen Description BLOOD LEFT HAND   Final   Special Requests BOTTLES DRAWN AEROBIC ONLY 5CC   Final   Culture  Setup Time     Final   Value: 07/21/2013 16:31     Performed at [facility]   Culture     Final   Value:        BLOOD CULTURE RECEIVED NO GROWTH TO DATE CULTURE WILL BE HELD FOR 5 DAYS BEFORE ISSUING A FINAL NEGATIVE REPORT     Performed at [facility]   Report Status PENDING   Incomplete    Medical History: Past Medical History  Diagnosis Date  . Prostate cancer     hx of prostate ca  . Hypertension   . Thyroid disease   . Hypothyroidism   . Cough     due to scarring of lung  . GERD (gastroesophageal reflux disease)   . Arthritis   . Complication of anesthesia     sometimes have trouble intubating not last 2 times; limited neck mobility--glidescope used in '08 and '10      Assessment: 76 YOM started on Unasyn for possible aspiration PNA s/p STEMI/cardiac arrest.  Antibiotics broaden to vancomycin and  Zosyn today.  Patient's SCr continues to trend up and his UOP continues to be low.  Unasyn 12/10 >> 12/11  Zosyn 12/11 >> Vanc 12/11 >>  12/10 blood cx x2 - GPC (1 of 2) 12/10 urine cx - GNR 12/10 resp cx - abundant GNR   Goal of Therapy:  Vancomycin trough level 15-20 mcg/ml   Plan:  - Vanc 2gm IV x 1, then 1500mg  IV Q24H - Zosyn 3.375gm IV Q8H, 4 hr infusion - Monitor renal fxn, clinical course, vanc trough at Css - F/U TG if still on Propofol 12/13, DPH level around 12/14, seizure activity   Kenzly Rogoff D. Laney Potash, PharmD, BCPS Pager:  [phone] 07/22/2013, 10:50 AM

## 2013-07-22 NOTE — Significant Event (Signed)
Pt remains on diprivan to suppress seizure activity.  He is hypotensive, and has been on levophed/phenylephrine/vasopressin.  Attempts made to decrease phenylephrine to off >> not able to tolerate.  Will re-order phenylephrine.  Coralyn Helling, MD 07/22/2013, 3:57 PM

## 2013-07-23 ENCOUNTER — Inpatient Hospital Stay (HOSPITAL_COMMUNITY): Payer: Medicare Other

## 2013-07-23 LAB — URINE CULTURE: Colony Count: 75000

## 2013-07-23 LAB — POCT I-STAT, CHEM 8
Calcium, Ion: 1.15 mmol/L (ref 1.13–1.30)
Chloride: 108 mEq/L (ref 96–112)
Creatinine, Ser: 0.5 mg/dL (ref 0.50–1.35)
Glucose, Bld: 264 mg/dL — ABNORMAL HIGH (ref 70–99)
Glucose, Bld: 306 mg/dL — ABNORMAL HIGH (ref 70–99)
HCT: 36 % — ABNORMAL LOW (ref 39.0–52.0)
HCT: 38 % — ABNORMAL LOW (ref 39.0–52.0)
Hemoglobin: 12.2 g/dL — ABNORMAL LOW (ref 13.0–17.0)
Hemoglobin: 12.9 g/dL — ABNORMAL LOW (ref 13.0–17.0)
Potassium: 2.5 mEq/L — CL (ref 3.5–5.1)
Sodium: 141 mEq/L (ref 135–145)
Sodium: 140 mEq/L (ref 135–145)
TCO2: 14 mmol/L (ref 0–100)
TCO2: 15 mmol/L (ref 0–100)

## 2013-07-23 LAB — GLUCOSE, CAPILLARY
Glucose-Capillary: 122 mg/dL — ABNORMAL HIGH (ref 70–99)
Glucose-Capillary: 145 mg/dL — ABNORMAL HIGH (ref 70–99)
Glucose-Capillary: 67 mg/dL — ABNORMAL LOW (ref 70–99)
Glucose-Capillary: 119 mg/dL — ABNORMAL HIGH (ref 70–99)
Glucose-Capillary: 135 mg/dL — ABNORMAL HIGH (ref 70–99)

## 2013-07-23 LAB — CULTURE, RESPIRATORY W GRAM STAIN: Special Requests: NORMAL

## 2013-07-23 LAB — CBC
MCH: 28.6 pg (ref 26.0–34.0)
MCHC: 33 g/dL (ref 30.0–36.0)
MCV: 86.7 fL (ref 78.0–100.0)
Platelets: 155 10*3/uL (ref 150–400)
RDW: 15.2 % (ref 11.5–15.5)

## 2013-07-23 LAB — CULTURE, RESPIRATORY: Gram Stain: NONE SEEN

## 2013-07-23 LAB — BASIC METABOLIC PANEL
Calcium: 6.8 mg/dL — ABNORMAL LOW (ref 8.4–10.5)
Creatinine, Ser: 1.26 mg/dL (ref 0.50–1.35)
GFR calc Af Amer: 63 mL/min — ABNORMAL LOW (ref >90)
GFR calc non Af Amer: 54 mL/min — ABNORMAL LOW (ref >90)
Sodium: 124 mEq/L — ABNORMAL LOW (ref 135–145)

## 2013-07-23 MED ORDER — DEXTROSE 50 % IV SOLN
50.0000 mL | Freq: Once | INTRAVENOUS | Status: AC | PRN
  Administered 2013-07-23: 25 mL via INTRAVENOUS

## 2013-07-23 MED ORDER — DEXTROSE 50 % IV SOLN
INTRAVENOUS | Status: AC
  Filled 2013-07-23: qty 50

## 2013-07-23 MED ORDER — DEXTROSE 50 % IV SOLN
25.0000 mL | Freq: Once | INTRAVENOUS | Status: AC | PRN
  Administered 2013-07-23: 25 mL via INTRAVENOUS

## 2013-07-23 NOTE — Progress Notes (Signed)
NUTRITION FOLLOW UP  Intervention:   1. Enteral nutrition; If TFs warranted, recommend Vital HP @ 20 ml/hr via NG tube and increase by 10 ml every 6 hours to goal rate of 80 ml/hr. At goal rate, tube feeding regimen will provide 1920 kcal, 168 grams of protein, and 1610 ml of H2O plus IV fluids. Regimen will meet 100% of estimated energy needs and 100% of estimated protein needs. 2.  GOC; nutrition interventions to remain appropriate per GOC.  Nutrition Dx:   Inadequate oral intake, ongoing.   Monitor:   1.  Enteral nutrition; initiation with tolerance.  Enteral nutrition to provide 60-70% of estimated calorie needs (22-25 kcals/kg ideal body weight) and 100% of estimated protein needs, based on ASPEN guidelines for permissive underfeeding in critically ill obese individuals. 2.  Wt/wt change; monitor trends 3.  GOC; monitor for changes.   Assessment:   77 yo man with PMH of hypertension, hypothyroidism with recent October hospitalization with laminectomy in lumbar region who was BIBA after cardiac arrest at home. His wife left their living and came back and found her husband unresponsive leading her to call EMS. On arrival, [name] had no pulse, CPR started, initial rhythm VT, shocked x1, then asystole with CPR 1-2 minutes, next rhythm shockable and returned to tachycardia. Code STEMI called and cath lab activated. [name] intubated in the ER.  Patient is currently intubated on ventilator support.  MV: 18 L/min Temp (24hrs), Avg:97.6 F (36.4 C), Min:95.7 F (35.4 C), Max:99.3 F (37.4 C)  Propofol: none  Pt with ongoing seizure activity and increased sedation needs.  Now DNR and possible initiation of comfort measures today after family arrives. Recommendations provided above remain appropriate in the event aggressive care continues.     Height: Ht Readings from Last 1 Encounters:  07/19/13 6' 1.62" (1.87 m)    Weight Status:   Wt Readings from Last 1 Encounters:  07/22/13  250 lb (113.4 kg)    Re-estimated needs:  Kcal (Permissive underfeeding): 1610-9604 Protein: >/=166g Fluid: >2.2 L/day  Skin:  Generalized edema Skin tear  Diet Order: NPO   Intake/Output Summary (Last 24 hours) at 07/23/13 1009 Last data filed at 07/23/13 0900  Gross per 24 hour  Intake 4849.12 ml  Output    885 ml  Net 3964.12 ml    Last BM: PTA   Labs:   Recent Labs Lab 07/19/13 1900  07/20/13 0440  07/21/13 0500 07/22/13 0450 07/23/13 0500  NA 134*  < > 133*  < > 134* 129* 124*  K 2.6*  < > 3.0*  < > 4.3 4.8 4.6  CL 103  < > 102  < > 103 96 93*  CO2 12*  < > 12*  < > 19 17* 22  BUN 7  < > 7  < > 15 25* 30*  CREATININE 0.57  < > 0.53  < > 1.37* 1.44* 1.26  CALCIUM 8.1*  < > 8.4  < > 7.7* 7.0* 6.8*  MG 2.1  --  1.8  --  1.6  --   --   PHOS  --   --  1.1*  --  2.0* 5.7*  --   GLUCOSE 252*  < > 178*  < > 123* 242* 180*  < > = values in this interval not displayed.  CBG (last 3)   Recent Labs  07/23/13 0429 07/23/13 0506 07/23/13 0807  GLUCAP 61* 140* 125*    Scheduled Meds: . antiseptic oral  rinse  15 mL Mouth Rinse QID  . aspirin  81 mg Per Tube Daily  . atorvastatin  80 mg Per Tube q1800  . chlorhexidine  15 mL Mouth Rinse BID  . heparin  5,000 Units Subcutaneous Q8H  . insulin aspart  2-6 Units Subcutaneous Q4H  . insulin glargine  30 Units Subcutaneous Q24H  . levETIRAcetam  500 mg Intravenous Q12H  . levothyroxine  50 mcg Oral Custom  . [START ON 2013-08-18] levothyroxine  75 mcg Oral Custom  . pantoprazole sodium  40 mg Per Tube Daily  . phenytoin (DILANTIN) IV  100 mg Intravenous Q8H  . piperacillin-tazobactam (ZOSYN)  IV  3.375 g Intravenous Q8H  . sodium chloride  3 mL Intravenous Q12H  . Ticagrelor  90 mg Oral BID  . vancomycin  1,500 mg Intravenous Q24H    Continuous Infusions: . sodium chloride 10 mL/hr at 07/21/13 1435  . amiodarone (NEXTERONE PREMIX) 360 mg/200 mL dextrose 30 mg/hr (07/23/13 0512)  . dextrose 10 mL/hr at  07/22/13 2000  . nitroGLYCERIN    . norepinephrine (LEVOPHED) Adult infusion 35 mcg/min (07/23/13 1001)  . phenylephrine (NEO-SYNEPHRINE) Adult infusion Stopped (07/23/13 0800)  . propofol 30 mcg/kg/min (07/23/13 0754)  . vasopressin (PITRESSIN) infusion - *FOR SHOCK* 0.03 Units/min (07/23/13 0300)    Loyce Dys, MS RD LDN Clinical Inpatient Dietitian Pager: [phone] Weekend/After hours pager: [phone]

## 2013-07-23 NOTE — Progress Notes (Signed)
LTM EEG D/C'd per Dr Stewart 

## 2013-07-23 NOTE — Progress Notes (Signed)
07/23/2013 1150  CBG 67.  D50 25mL given.  D10 infusion increased to 40 mL/hr.   1205  Repeat CBG 145.  Standley Bargo, Linnell Fulling

## 2013-07-23 NOTE — Progress Notes (Signed)
Patient is showing no signs of recovery since cardiac arrest and severe anoxic brain injury on 07/12/2013. Propofol has been withdrawn with no indication of improved level of responsiveness. Prognosis is extremely poor for recovery to any extent of meaningful quality of life. No further neurological intervention is indicated. I agree with continuing support only to the extent of family's wishes.  Venetia Maxon M.D. [facility] [phone]

## 2013-07-23 NOTE — Progress Notes (Signed)
DAILY PROGRESS NOTE  Subjective:  Intubated. EEG demonstrated seizure activity - now appears controlled with medications. There are signs of HIE. Discussion with Dr. Tyson Alias and family yesterday seems to indicate a desire for comfort care at this point. Sodium is trending down - 124 today. He is on 3 pressors at this point and amiodarone. Maintaining sinus.  Objective:  Temp:  [95.7 F (35.4 C)-99 F (37.2 C)] 99 F (37.2 C) (12/12 0700) Pulse Rate:  [63-72] 71 (12/12 0700) Resp:  [11-14] 13 (12/12 0700) BP: (91-116)/(39-62) 116/51 mmHg (12/12 0600) SpO2:  [95 %-100 %] 96 % (12/12 0700) Arterial Line BP: (70-139)/(38-64) 128/55 mmHg (12/12 0700) FiO2 (%):  [80 %-100 %] 80 % (12/12 0400) Weight change:   Intake/Output from previous day: 12/11 0701 - 12/12 0700 In: 4903.7 [I.V.:3953.4; NG/GT:90; IV Piggyback:860.3] Out: 945 [Urine:945]  Intake/Output from this shift:    Medications: Current Facility-Administered Medications  Medication Dose Route Frequency Provider Last Rate Last Dose  . 0.9 %  sodium chloride infusion  250 mL Intravenous PRN Marykay Lex, MD 10 mL/hr at 07/23/13 0400 250 mL at 07/23/13 0400  . 0.9 %  sodium chloride infusion   Intravenous Continuous Alyson Reedy, MD 10 mL/hr at 07/21/13 1435    . acetaminophen (TYLENOL) solution 650 mg  650 mg Per Tube Q4H PRN Nelda Bucks, MD      . amiodarone (NEXTERONE PREMIX) 360 mg/200 mL dextrose IV infusion  30 mg/hr Intravenous Continuous Marykay Lex, MD 16.7 mL/hr at 07/23/13 0512 30 mg/hr at 07/23/13 0512  . antiseptic oral rinse (BIOTENE) solution 15 mL  15 mL Mouth Rinse QID Lupita Leash, MD   15 mL at 07/23/13 0400  . aspirin chewable tablet 81 mg  81 mg Per Tube Daily Marykay Lex, MD   81 mg at 07/22/13 1009  . atorvastatin (LIPITOR) tablet 80 mg  80 mg Per Tube q1800 Marykay Lex, MD   80 mg at 07/22/13 1734  . chlorhexidine (PERIDEX) 0.12 % solution 15 mL  15 mL Mouth Rinse BID  Lupita Leash, MD   15 mL at 07/23/13 0754  . dextrose 10 % infusion   Intravenous Continuous PRN Alyson Reedy, MD 10 mL/hr at 07/22/13 2000    . fentaNYL (SUBLIMAZE) injection 25-100 mcg  25-100 mcg Intravenous Q2H PRN Nelda Bucks, MD   100 mcg at 07/21/13 1639  . heparin injection 5,000 Units  5,000 Units Subcutaneous Q8H Lupita Leash, MD   5,000 Units at 07/23/13 0700  . hydrALAZINE (APRESOLINE) injection 20 mg  20 mg Intravenous Q4H PRN Marykay Lex, MD   20 mg at 07/19/13 0521  . insulin aspart (novoLOG) injection 2-6 Units  2-6 Units Subcutaneous Q4H Alyson Reedy, MD   4 Units at 07/22/13 1624  . insulin glargine (LANTUS) injection 30 Units  30 Units Subcutaneous Q24H Alyson Reedy, MD   30 Units at 07/22/13 1734  . levETIRAcetam (KEPPRA) 500 mg in sodium chloride 0.9 % 100 mL IVPB  500 mg Intravenous Q12H Zigmund Gottron, MD   500 mg at 07/22/13 2150  . levothyroxine (SYNTHROID, LEVOTHROID) tablet 50 mcg  50 mcg Oral Custom Leeann Must, MD   50 mcg at 07/23/13 0700  . [START ON 07/23/2013] levothyroxine (SYNTHROID, LEVOTHROID) tablet 75 mcg  75 mcg Oral Custom Leeann Must, MD      . midazolam (VERSED) injection 2 mg  2 mg Intravenous Q4H  PRN Coralyn Helling, MD   2 mg at 07/21/13 1536  . nitroGLYCERIN (NITROSTAT) SL tablet 0.4 mg  0.4 mg Sublingual Q5 Min x 3 PRN Leeann Must, MD      . nitroGLYCERIN 0.2 mg/mL in dextrose 5 % infusion  5 mcg/min Intravenous Titrated Marykay Lex, MD      . norepinephrine (LEVOPHED) 16 mg in dextrose 5 % 250 mL infusion  0.5-50 mcg/min Intravenous Titrated Coralyn Helling, MD 46.9 mL/hr at 07/23/13 0438 50 mcg/min at 07/23/13 0438  . pantoprazole sodium (PROTONIX) 40 mg/20 mL oral suspension 40 mg  40 mg Per Tube Daily Lupita Leash, MD   40 mg at 07/22/13 1010  . phenylephrine (NEO-SYNEPHRINE) 10 mg in dextrose 5 % 250 mL infusion  30-200 mcg/min Intravenous Titrated Coralyn Helling, MD 45 mL/hr at 07/23/13 0100 30 mcg/min at 07/23/13  0100  . phenytoin (DILANTIN) injection 100 mg  100 mg Intravenous Q8H Charles Stewart   100 mg at 07/23/13 0136  . piperacillin-tazobactam (ZOSYN) IVPB 3.375 g  3.375 g Intravenous Q8H Thuy Dien Dang, RPH   3.375 g at 07/23/13 0400  . propofol (DIPRIVAN) 10 mg/ml infusion  5-150 mcg/kg/min Intravenous Titrated Ritta Slot, MD 19.9 mL/hr at 07/23/13 0754 30 mcg/kg/min at 07/23/13 0754  . sodium chloride 0.9 % injection 3 mL  3 mL Intravenous Q12H Marykay Lex, MD   3 mL at 07/22/13 2100  . sodium chloride 0.9 % injection 3 mL  3 mL Intravenous PRN Marykay Lex, MD      . Ticagrelor [facility]) tablet 90 mg  90 mg Oral BID Lupita Leash, MD   90 mg at 07/22/13 2150  . vancomycin (VANCOCIN) 1,500 mg in sodium chloride 0.9 % 500 mL IVPB  1,500 mg Intravenous Q24H Thuy Dien Dang, [facility]      . vasopressin (PITRESSIN) 50 Units in sodium chloride 0.9 % 250 mL infusion  0.03 Units/min Intravenous Continuous Coralyn Helling, MD 9 mL/hr at 07/23/13 0300 0.03 Units/min at 07/23/13 0300    Physical Exam: General appearance: sedated on vent, EEG in place, no responses Neck: no carotid bruit and no JVD Lungs: rhonchi bilaterally and coarse breath sounds Heart: regular rate and rhythm Abdomen: soft, non-tender; bowel sounds normal; no masses,  no organomegaly Extremities: edema trace edema, warm Pulses: 2+ and symmetric Skin: Skin color, texture, turgor normal. No rashes or lesions Neurologic: Mental status: Sedated, non-responsive .  Lab Results: Results for orders placed during the hospital encounter of 07/21/2013 (from the past 48 hour(s))  URINALYSIS, ROUTINE W REFLEX MICROSCOPIC     Status: Abnormal   Collection Time    07/21/13 10:32 AM      Result Value Range   Color, Urine AMBER (*) YELLOW   Comment: BIOCHEMICALS MAY BE AFFECTED BY COLOR   APPearance CLOUDY (*) CLEAR   Specific Gravity, Urine 1.036 (*) 1.005 - 1.030   pH 5.5  5.0 - 8.0   Glucose, UA NEGATIVE  NEGATIVE mg/dL   Hgb  urine dipstick MODERATE (*) NEGATIVE   Bilirubin Urine SMALL (*) NEGATIVE   Ketones, ur 15 (*) NEGATIVE mg/dL   Protein, ur 409 (*) NEGATIVE mg/dL   Urobilinogen, UA 0.2  0.0 - 1.0 mg/dL   Nitrite NEGATIVE  NEGATIVE   Leukocytes, UA SMALL (*) NEGATIVE  URINE MICROSCOPIC-ADD ON     Status: Abnormal   Collection Time    07/21/13 10:32 AM      Result Value Range   Squamous  Epithelial / LPF RARE  RARE   WBC, UA 3-6  <3 WBC/hpf   RBC / HPF 7-10  <3 RBC/hpf   Bacteria, UA FEW (*) RARE   Casts HYALINE CASTS (*) NEGATIVE   Urine-Other MUCOUS PRESENT    URINE CULTURE     Status: None   Collection Time    07/21/13 10:32 AM      Result Value Range   Specimen Description URINE, CATHETERIZED     Special Requests NONE     Culture  Setup Time       Value: 07/21/2013 12:14     Performed at [facility] Count       Value: 75,000 COLONIES/ML     Performed at [facility]   Culture       Value: KLEBSIELLA PNEUMONIAE     Performed at [facility]   Report Status 07/23/2013 FINAL     Organism ID, Bacteria KLEBSIELLA PNEUMONIAE    CULTURE, RESPIRATORY (NON-EXPECTORATED)     Status: None   Collection Time    07/21/13 10:40 AM      Result Value Range   Specimen Description TRACHEAL ASPIRATE     Special Requests Normal     Gram Stain       Value: NO WBC SEEN     NO SQUAMOUS EPITHELIAL CELLS SEEN     ABUNDANT GRAM NEGATIVE RODS     RARE GRAM POSITIVE COCCI IN PAIRS     Performed at [facility]   Culture       Value: ABUNDANT KLEBSIELLA PNEUMONIAE     Performed at [facility]   Report Status 07/23/2013 FINAL     Organism ID, Bacteria KLEBSIELLA PNEUMONIAE    POCT I-STAT 3, BLOOD GAS (G3+)     Status: Abnormal   Collection Time    07/21/13 10:45 AM      Result Value Range   pH, Arterial 7.449  7.350 - 7.450   pCO2 arterial 29.8 (*) 35.0 - 45.0 mmHg   pO2, Arterial 57.0 (*) 80.0 - 100.0 mmHg   Bicarbonate 20.7  20.0 - 24.0 mEq/L   TCO2  22  0 - 100 mmol/L   O2 Saturation 91.0     Acid-base deficit 3.0 (*) 0.0 - 2.0 mmol/L   Patient temperature 98.8 F     Collection site RADIAL, ALLEN'S TEST ACCEPTABLE     Drawn by Operator     Sample type ARTERIAL    CULTURE, BLOOD (ROUTINE X 2)     Status: None   Collection Time    07/21/13 10:50 AM      Result Value Range   Specimen Description BLOOD RIGHT HAND     Special Requests BOTTLES DRAWN AEROBIC ONLY 5CC     Culture  Setup Time       Value: 07/21/2013 16:30     Performed at [facility]   Culture       Value:        BLOOD CULTURE RECEIVED NO GROWTH TO DATE CULTURE WILL BE HELD FOR 5 DAYS BEFORE ISSUING A FINAL NEGATIVE REPORT     Performed at [facility]   Report Status PENDING    CULTURE, BLOOD (ROUTINE X 2)     Status: None   Collection Time    07/21/13 11:00 AM      Result Value Range   Specimen Description BLOOD LEFT HAND     Special Requests  BOTTLES DRAWN AEROBIC ONLY 5CC     Culture  Setup Time       Value: 07/21/2013 16:31     Performed at [facility]   Culture       Value: GRAM POSITIVE COCCI IN CLUSTERS     Note: Gram Stain Report Called to,Read Back By and Verified With: NICCI BEASELEY BY INGRAM A 12/11 1050AM     Performed at [facility]   Report Status PENDING    GLUCOSE, CAPILLARY     Status: None   Collection Time    07/21/13 11:47 AM      Result Value Range   Glucose-Capillary 84  70 - 99 mg/dL  POCT I-STAT 3, BLOOD GAS (G3+)     Status: Abnormal   Collection Time    07/21/13  3:17 PM      Result Value Range   pH, Arterial 7.406  7.350 - 7.450   pCO2 arterial 31.5 (*) 35.0 - 45.0 mmHg   pO2, Arterial 57.0 (*) 80.0 - 100.0 mmHg   Bicarbonate 19.5 (*) 20.0 - 24.0 mEq/L   TCO2 20  0 - 100 mmol/L   O2 Saturation 87.0     Acid-base deficit 4.0 (*) 0.0 - 2.0 mmol/L   Patient temperature 38.7 C     Collection site RADIAL, ALLEN'S TEST ACCEPTABLE     Drawn by Operator     Sample type ARTERIAL    GLUCOSE,  CAPILLARY     Status: None   Collection Time    07/21/13  3:46 PM      Result Value Range   Glucose-Capillary 78  70 - 99 mg/dL   Comment 1 Documented in Chart     Comment 2 Notify RN    GLUCOSE, CAPILLARY     Status: Abnormal   Collection Time    07/21/13  8:34 PM      Result Value Range   Glucose-Capillary 100 (*) 70 - 99 mg/dL  GLUCOSE, CAPILLARY     Status: Abnormal   Collection Time    07/22/13  1:16 AM      Result Value Range   Glucose-Capillary 176 (*) 70 - 99 mg/dL  GLUCOSE, CAPILLARY     Status: Abnormal   Collection Time    07/22/13  4:42 AM      Result Value Range   Glucose-Capillary 188 (*) 70 - 99 mg/dL  CBC     Status: Abnormal   Collection Time    07/22/13  4:50 AM      Result Value Range   WBC 10.3  4.0 - 10.5 K/uL   RBC 3.39 (*) 4.22 - 5.81 MIL/uL   Hemoglobin 9.6 (*) 13.0 - 17.0 g/dL   HCT 16.1 (*) 09.6 - 04.5 %   MCV 87.9  78.0 - 100.0 fL   MCH 28.3  26.0 - 34.0 pg   MCHC 32.2  30.0 - 36.0 g/dL   RDW 40.9 (*) 81.1 - 91.4 %   Platelets 207  150 - 400 K/uL  BASIC METABOLIC PANEL     Status: Abnormal   Collection Time    07/22/13  4:50 AM      Result Value Range   Sodium 129 (*) 135 - 145 mEq/L   Potassium 4.8  3.5 - 5.1 mEq/L   Chloride 96  96 - 112 mEq/L   CO2 17 (*) 19 - 32 mEq/L   Glucose, Bld 242 (*) 70 - 99 mg/dL   BUN  25 (*) 6 - 23 mg/dL   Creatinine, Ser 1.61 (*) 0.50 - 1.35 mg/dL   Calcium 7.0 (*) 8.4 - 10.5 mg/dL   GFR calc non Af Amer 46 (*) >90 mL/min   GFR calc Af Amer 54 (*) >90 mL/min   Comment: (NOTE)     The eGFR has been calculated using the CKD EPI equation.     This calculation has not been validated in all clinical situations.     eGFR's persistently <90 mL/min signify possible Chronic Kidney     Disease.  PHOSPHORUS     Status: Abnormal   Collection Time    07/22/13  4:50 AM      Result Value Range   Phosphorus 5.7 (*) 2.3 - 4.6 mg/dL  GLUCOSE, CAPILLARY     Status: Abnormal   Collection Time    07/22/13  8:22 AM       Result Value Range   Glucose-Capillary 178 (*) 70 - 99 mg/dL  GLUCOSE, CAPILLARY     Status: Abnormal   Collection Time    07/22/13 12:38 PM      Result Value Range   Glucose-Capillary 192 (*) 70 - 99 mg/dL  GLUCOSE, CAPILLARY     Status: Abnormal   Collection Time    07/22/13  4:14 PM      Result Value Range   Glucose-Capillary 156 (*) 70 - 99 mg/dL  GLUCOSE, CAPILLARY     Status: Abnormal   Collection Time    07/22/13  8:14 PM      Result Value Range   Glucose-Capillary 127 (*) 70 - 99 mg/dL  GLUCOSE, CAPILLARY     Status: Abnormal   Collection Time    07/23/13  1:00 AM      Result Value Range   Glucose-Capillary 122 (*) 70 - 99 mg/dL  GLUCOSE, CAPILLARY     Status: Abnormal   Collection Time    07/23/13  4:29 AM      Result Value Range   Glucose-Capillary 61 (*) 70 - 99 mg/dL  CBC     Status: Abnormal   Collection Time    07/23/13  5:00 AM      Result Value Range   WBC 11.2 (*) 4.0 - 10.5 K/uL   RBC 3.01 (*) 4.22 - 5.81 MIL/uL   Hemoglobin 8.6 (*) 13.0 - 17.0 g/dL   HCT 09.6 (*) 04.5 - 40.9 %   MCV 86.7  78.0 - 100.0 fL   MCH 28.6  26.0 - 34.0 pg   MCHC 33.0  30.0 - 36.0 g/dL   RDW 81.1  91.4 - 78.2 %   Platelets 155  150 - 400 K/uL   Comment: REPEATED TO VERIFY     SPECIMEN CHECKED FOR CLOTS     DELTA CHECK NOTED  BASIC METABOLIC PANEL     Status: Abnormal   Collection Time    07/23/13  5:00 AM      Result Value Range   Sodium 124 (*) 135 - 145 mEq/L   Potassium 4.6  3.5 - 5.1 mEq/L   Chloride 93 (*) 96 - 112 mEq/L   CO2 22  19 - 32 mEq/L   Glucose, Bld 180 (*) 70 - 99 mg/dL   BUN 30 (*) 6 - 23 mg/dL   Creatinine, Ser 9.56  0.50 - 1.35 mg/dL   Calcium 6.8 (*) 8.4 - 10.5 mg/dL   GFR calc non Af Amer 54 (*) >90 mL/min   GFR calc  Af Amer 63 (*) >90 mL/min   Comment: (NOTE)     The eGFR has been calculated using the CKD EPI equation.     This calculation has not been validated in all clinical situations.     eGFR's persistently <90 mL/min signify possible  Chronic Kidney     Disease.  GLUCOSE, CAPILLARY     Status: Abnormal   Collection Time    07/23/13  5:06 AM      Result Value Range   Glucose-Capillary 140 (*) 70 - 99 mg/dL    Imaging: Ct Head Wo Contrast  07/21/2013   CLINICAL DATA:  Intubated, seizure  EXAM: CT HEAD WITHOUT CONTRAST  TECHNIQUE: Contiguous axial images were obtained from the base of the skull through the vertex without intravenous contrast.  COMPARISON:  05/24/2013  FINDINGS: Endotracheal tube partly visualized. Right maxillary sinus air-fluid level is present. Orbits are unremarkable. No skull fracture. Mild cortical volume loss noted with proportional ventricular prominence. Areas of periventricular white matter hypodensity are most compatible with small vessel ischemic change. No acute hemorrhage, infarct, or mass lesion is identified. No midline shift. Hypodensity in the region of the pons is most likely related to streak artifact from the skull base.  IMPRESSION: Chronic changes as above without acute intracranial finding.   Electronically Signed   By: Christiana Pellant M.D.   On: 07/21/2013 11:48   Dg Chest Port 1 View  07/23/2013   CLINICAL DATA:  Hypoxia  EXAM: PORTABLE CHEST - 1 VIEW  COMPARISON:  July 22, 2013  FINDINGS: Endotracheal tube tip is 4.5 cm above the carina. Nasogastric tube tip and side extend below the diaphragm. Central catheter tip is in the superior vena cava. No pneumothorax.  There is widespread interstitial and alveolar consolidation throughout much of the right lung, stable. There is patchy interstitial edema in the left base. There is consolidation in the medial left base. There are small effusions bilaterally. Heart is enlarged. Pulmonary vascularity overall appears unremarkable.  IMPRESSION: Tube and catheter positions as described. No appreciable pneumothorax. Evidence of congestive heart failure with widespread edema, much more on the right than on the left. Superimposed pneumonia in the right  cannot be excluded radiographically.   Electronically Signed   By: Bretta Bang M.D.   On: 07/23/2013 07:52   Dg Chest Port 1 View  07/22/2013   CLINICAL DATA:  Endotracheal tube.  Shortness of breath.  EXAM: PORTABLE CHEST - 1 VIEW  COMPARISON:  07/21/2013  FINDINGS: Endotracheal tube tip projects approximately 5 cm above the carina, unchanged. Right jugular central venous catheter tip overlies the mid to upper SVC, unchanged. Enteric tube courses into the left upper abdomen with its course incompletely imaged, however it appears to loop in the left upper quadrant with tip overlying the expected region of the gastric fundus. The cardiac silhouette remains enlarged. There is worsening right perihilar opacity, which now appears as dense consolidation with air bronchograms. Mild left perihilar opacity is unchanged. Hazy opacity in the left lung base is unchanged and suggestive of a pleural effusion. There is also an unchanged small right pleural effusion.  IMPRESSION: 1. Worsening, dense consolidation in the right perihilar region, concerning for pneumonia or less likely asymmetric edema. 2. Bilateral pleural effusions. 3. Stable appearance of support lines and tubes.   Electronically Signed   By: Sebastian Ache   On: 07/22/2013 07:29    Assessment:  1. Principal Problem: 2.   Cardiac arrest 12-29-14pm -out of hospital with CPR,  VT In setting of Anterior STEMI 3. Active Problems: 4.   HTN (hypertension) 5.   Hypothyroidism 6.   Hyperglycemia 7.   Acute respiratory failure 8.   CAD- urgent DES to mid LAD (STEMI) 07/19/13 9.   Cardiogenic shock 10.   H/O prostate cancer 11.   Aspiration pneumonia 12.   Seizure 13.   Anoxic brain injury 14.   Status epilepticus 15.   Plan:  1. EEG/exam suggests HIE. Prognosis neurologically is poor. He is still requiring multiple pressors. Attempt to wean somewhat today. Maintaining sinus on amiodarone. Sodium trending down - may be centrally mediated. Agree  based on his living will and family wishes that comfort measures are reasonable.   Will be available as needed over the weekend.  Time Spent Directly with Patient:  15 minutes  Length of Stay:  LOS: 5 days   Chrystie Nose, MD, [facility] Attending Cardiologist [facility]  Lei Dower C 07/23/2013, 7:59 AM

## 2013-07-23 NOTE — Progress Notes (Signed)
Pt had cuff leak despite air added, MD used tube exchanger to replace size 6.5 ETT with a size 8.0 ETT. Pt placed back on vent with no leak present.

## 2013-07-23 NOTE — Progress Notes (Signed)
PULMONARY  / CRITICAL CARE MEDICINE CONSULTATION  Name: [name] MRN: [id number] DOB: [birth year]    ADMISSION DATE:  07/26/2013  CHIEF COMPLAINT:  Cardiac arrest  BRIEF PATIENT DESCRIPTION: 77 year-old male presenting the following cardiac arrest in the setting of STEMI. Pulmonary critical care medicine consult for ventilator management.  SIGNIFICANT EVENTS / STUDIES:  1. Cardiac arrest 07/16/2013 2. Intubated 07/21/2013 3. Cath 07/19/2013 - Preliminary report intervention done on LAD 4. 12-10 warmed and note rhythmic facial twitching. Frothy secretions 5.  12/9- status on eeg 6. 12/9 ct head neg edema.    7. Persistent seizure on EEG 12/12  LINES / TUBES: 1. RIJ CVC 12/8>>> 2. ET Tube 12/7>>>12/12 (cuff leak size 6 tube changed to 8)>>>12/12>>> 3. R radial a-line 12/8>>>  CULTURES: 1. Sputum 12-10>> 2. BCx 2 12-10>> 3. UC 12-10>>  ANTIBIOTICS: 1. 12-10 Unasyn>>  PHYSICAL EXAM  VITAL SIGNS: Temp:  [95.7 F (35.4 C)-99.3 F (37.4 C)] 99.3 F (37.4 C) (12/12 0900) Pulse Rate:  [63-72] 71 (12/12 0900) Resp:  [11-15] 15 (12/12 0900) BP: (91-121)/(39-62) 114/51 mmHg (12/12 0831) SpO2:  [94 %-100 %] 94 % (12/12 0900) Arterial Line BP: (70-137)/(38-64) 121/52 mmHg (12/12 0900) FiO2 (%):  [80 %-100 %] 80 % (12/12 0831)  HEMODYNAMICS: CVP:  [8 mmHg-14 mmHg] 14 mmHg  VENTILATOR SETTINGS: Vent Mode:  [-] PRVC FiO2 (%):  [80 %-100 %] 80 % Set Rate:  [12 bmp] 12 bmp Vt Set:  [580 mL] 580 mL PEEP:  [5 cmH20] 5 cmH20 Plateau Pressure:  [18 cmH20-28 cmH20] 18 cmH20  INTAKE / OUTPUT: Intake/Output     12/11 0701 - 12/12 0700 12/12 0701 - 12/13 0700   I.V. (mL/kg) 4288.2 (37.8) 288.9 (2.5)   NG/GT 90 30   IV Piggyback 860.3    Total Intake(mL/kg) 5238.5 (46.2) 318.9 (2.8)   Urine (mL/kg/hr) 945 (0.3) 75 (0.2)   Total Output 945 75   Net +4293.5 +243.9         PHYSICAL EXAMINATION: General:  Elderly with fixed gaze, frothy sputum unresponsive Neuro:   Overbreathing vent, facial twitching. disconjugate gaze . No response to painful stimuli HEENT:  Sclera anicteric, conjunctiva pink, MMM, ETT present Neck:  Supple and trachea midline, (-) LAN or JVD Cardiovascular:  RRR, NS1/S2, (-) MRG Lungs: Coarse  breath sounds bilaterally, copious secretions  Abdomen:  S/NT/ND/(+)BS Musculoskeletal:  (-) C/C/E Skin:  Intact  LABS:  CBC Recent Labs     07/21/13  0500  07/22/13  0450  07/23/13  0500  WBC  12.8*  10.3  11.2*  HGB  9.6*  9.6*  8.6*  HCT  28.4*  29.8*  26.1*  PLT  212  207  155   Coag's No results found for this basename: APTT, INR,  in the last 72 hours BMET Recent Labs     07/21/13  0500  07/22/13  0450  07/23/13  0500  NA  134*  129*  124*  K  4.3  4.8  4.6  CL  103  96  93*  CO2  19  17*  22  BUN  15  25*  30*  CREATININE  1.37*  1.44*  1.26  GLUCOSE  123*  242*  180*   Electrolytes Recent Labs     07/21/13  0500  07/22/13  0450  07/23/13  0500  CALCIUM  7.7*  7.0*  6.8*  MG  1.6   --    --   PHOS  2.0*  5.7*   --    Sepsis Markers No results found for this basename: LACTICACIDVEN, PROCALCITON, O2SATVEN,  in the last 72 hours  ABG Recent Labs     07/21/13  0454  07/21/13  1045  07/21/13  1517  PHART  7.501*  7.449  7.406  PCO2ART  27.9*  29.8*  31.5*  PO2ART  76.4*  57.0*  57.0*   Liver Enzymes No results found for this basename: AST, ALT, ALKPHOS, BILITOT, ALBUMIN,  in the last 72 hours  Cardiac Enzymes No results found for this basename: TROPONINI, PROBNP,  in the last 72 hours  Glucose Recent Labs     07/22/13  1614  07/22/13  2014  07/23/13  0100  07/23/13  0429  07/23/13  0506  07/23/13  0807  GLUCAP  156*  127*  122*  61*  140*  125*    Imaging Ct Head Wo Contrast  07/21/2013   CLINICAL DATA:  Intubated, seizure  EXAM: CT HEAD WITHOUT CONTRAST  TECHNIQUE: Contiguous axial images were obtained from the base of the skull through the vertex without intravenous contrast.   COMPARISON:  05/24/2013  FINDINGS: Endotracheal tube partly visualized. Right maxillary sinus air-fluid level is present. Orbits are unremarkable. No skull fracture. Mild cortical volume loss noted with proportional ventricular prominence. Areas of periventricular white matter hypodensity are most compatible with small vessel ischemic change. No acute hemorrhage, infarct, or mass lesion is identified. No midline shift. Hypodensity in the region of the pons is most likely related to streak artifact from the skull base.  IMPRESSION: Chronic changes as above without acute intracranial finding.   Electronically Signed   By: Christiana Pellant M.D.   On: 07/21/2013 11:48   Dg Chest Port 1 View  07/23/2013   CLINICAL DATA:  Hypoxia  EXAM: PORTABLE CHEST - 1 VIEW  COMPARISON:  July 22, 2013  FINDINGS: Endotracheal tube tip is 4.5 cm above the carina. Nasogastric tube tip and side extend below the diaphragm. Central catheter tip is in the superior vena cava. No pneumothorax.  There is widespread interstitial and alveolar consolidation throughout much of the right lung, stable. There is patchy interstitial edema in the left base. There is consolidation in the medial left base. There are small effusions bilaterally. Heart is enlarged. Pulmonary vascularity overall appears unremarkable.  IMPRESSION: Tube and catheter positions as described. No appreciable pneumothorax. Evidence of congestive heart failure with widespread edema, much more on the right than on the left. Superimposed pneumonia in the right cannot be excluded radiographically.   Electronically Signed   By: Bretta Bang M.D.   On: 07/23/2013 07:52   Dg Chest Port 1 View  07/22/2013   CLINICAL DATA:  Endotracheal tube.  Shortness of breath.  EXAM: PORTABLE CHEST - 1 VIEW  COMPARISON:  07/21/2013  FINDINGS: Endotracheal tube tip projects approximately 5 cm above the carina, unchanged. Right jugular central venous catheter tip overlies the mid to upper  SVC, unchanged. Enteric tube courses into the left upper abdomen with its course incompletely imaged, however it appears to loop in the left upper quadrant with tip overlying the expected region of the gastric fundus. The cardiac silhouette remains enlarged. There is worsening right perihilar opacity, which now appears as dense consolidation with air bronchograms. Mild left perihilar opacity is unchanged. Hazy opacity in the left lung base is unchanged and suggestive of a pleural effusion. There is also an unchanged small right pleural effusion.  IMPRESSION: 1. Worsening, dense  consolidation in the right perihilar region, concerning for pneumonia or less likely asymmetric edema. 2. Bilateral pleural effusions. 3. Stable appearance of support lines and tubes.   Electronically Signed   By: Sebastian Ache   On: 07/22/2013 07:29   CXR: see above  ASSESSMENT / PLAN: Principal Problem:   Cardiac arrest 12/7/14pm -out of hospital with CPR, VT In setting of Anterior STEMI Active Problems:   HTN (hypertension)   Hypothyroidism   Hyperglycemia   Acute respiratory failure   CAD- urgent DES to mid LAD (STEMI) 07/19/13   Cardiogenic shock   H/O prostate cancer   Aspiration pneumonia   Seizure   Anoxic brain injury   Status epilepticus  1. Acute respiratory failure: This is most likely secondary to the patient's cardiac arrest. Complicated by aspiration and seizures,   Lung protective ventilation  Maintain current mV  NO SBT today planned given neuro status  Likely to extubate upon arrival of the rest of the family and change to comfort care per Dr. Tyson Alias conversation with the family.  2. Cardiac Arrest: This is almost certainly secondary to the patient's acute ST segment elevation myocardial infarction. He is status post intervention.  Hypothermia protocol completed.  Coronary artery disease/STEMI management per cardiology, no cath given neuro status.  D/C neo and continue levo, ok for MAP of  60.  Stress dose steroids dc'd  3. Hyperglycemia: This may be a reflection of underlying diabetes versus the acute insult. CBG (last 3)   Recent Labs  07/23/13 0429 07/23/13 0506 07/23/13 0807  GLUCAP 61* 140* 125*    SSI  4. Hypothyrodisim  Check TSH(.582) continue current care.  5. Renal:  Recent Labs Lab 07/21/13 0500 07/22/13 0450 07/23/13 0500  K 4.3 4.8 4.6   Lab Results  Component Value Date   CREATININE 1.26 07/23/2013   CREATININE 1.44* 07/22/2013   CREATININE 1.37* 07/21/2013   May need lasix, would kvo, want to avoid Na drop whan abel to avoid d in fluids would do so  6. GI: TF reorder  7. Heme: dvt prevention  8. Neuro: 20 minutes downtime before ROSC.  Head CT neg acute Status - poor prognosis fxnal recovery Ct and EEG of head reviewed Ativan prn Anticonvulsants  Increased sedation to control szs which required increased pressor support. Per Dr. Tyson Alias conversation, upon arrival of family will change to comfort care, DNR for now.  ID Asp PNA unasyn dc Sputum to follow As massive increase pressors will change to zosyn, add vanc  Ccm 35 min  Alyson Reedy, M.D. [facility] Pulmonary/Critical Care Medicine. Pager: [phone]. After hours pager: [phone].

## 2013-07-24 ENCOUNTER — Inpatient Hospital Stay (HOSPITAL_COMMUNITY): Payer: Medicare Other

## 2013-07-24 LAB — BASIC METABOLIC PANEL
CO2: 23 mEq/L (ref 19–32)
Calcium: 6.9 mg/dL — ABNORMAL LOW (ref 8.4–10.5)
GFR calc non Af Amer: 84 mL/min — ABNORMAL LOW (ref >90)
Glucose, Bld: 77 mg/dL (ref 70–99)
Potassium: 3.4 mEq/L — ABNORMAL LOW (ref 3.5–5.1)
Sodium: 126 mEq/L — ABNORMAL LOW (ref 135–145)

## 2013-07-24 LAB — MAGNESIUM: Magnesium: 2 mg/dL (ref 1.5–2.5)

## 2013-07-24 LAB — TRIGLYCERIDES: Triglycerides: 36 mg/dL (ref <150)

## 2013-07-24 LAB — CBC
HCT: 22.8 % — ABNORMAL LOW (ref 39.0–52.0)
Hemoglobin: 7.7 g/dL — ABNORMAL LOW (ref 13.0–17.0)
MCHC: 33.8 g/dL (ref 30.0–36.0)
Platelets: 107 10*3/uL — ABNORMAL LOW (ref 150–400)
RBC: 2.74 MIL/uL — ABNORMAL LOW (ref 4.22–5.81)

## 2013-07-24 LAB — GLUCOSE, CAPILLARY
Glucose-Capillary: 101 mg/dL — ABNORMAL HIGH (ref 70–99)
Glucose-Capillary: 78 mg/dL (ref 70–99)
Glucose-Capillary: 91 mg/dL (ref 70–99)

## 2013-07-24 LAB — PHOSPHORUS: Phosphorus: 2.2 mg/dL — ABNORMAL LOW (ref 2.3–4.6)

## 2013-07-24 LAB — CULTURE, BLOOD (ROUTINE X 2)

## 2013-07-24 MED ORDER — MORPHINE SULFATE 10 MG/ML IJ SOLN
1.0000 mg/h | INTRAVENOUS | Status: DC
  Filled 2013-07-24: qty 10

## 2013-07-24 MED ORDER — MORPHINE BOLUS VIA INFUSION
5.0000 mg | INTRAVENOUS | Status: DC | PRN
  Filled 2013-07-24: qty 20

## 2013-07-24 MED ORDER — ACETAMINOPHEN 650 MG RE SUPP: 650.0000 mg | Freq: Four times a day (QID) | RECTAL | Status: DC | PRN

## 2013-07-24 MED ORDER — DEXTROSE 50 % IV SOLN: 25.0000 mL | Freq: Once | INTRAVENOUS | Status: DC | PRN

## 2013-07-24 MED ORDER — LORAZEPAM BOLUS VIA INFUSION
2.0000 mg | INTRAVENOUS | Status: DC | PRN
  Filled 2013-07-24: qty 5

## 2013-07-24 MED ORDER — LORAZEPAM 2 MG/ML IJ SOLN
1.0000 mg/h | INTRAVENOUS | Status: DC
  Filled 2013-07-24: qty 25

## 2013-07-24 MED ORDER — DEXTROSE 50 % IV SOLN
INTRAVENOUS | Status: AC
  Administered 2013-07-24: 50 mL
  Filled 2013-07-24: qty 50

## 2013-07-26 NOTE — Discharge Summary (Addendum)
[name] was a 77 y.o. male admitted on 07/27/2013 with cardiac arrest from ventricular tachycardia and STEMI.  He was intubated, and taken for cardiac catheterization.  He had stenting to the distal LAD.  Hypothermia protocol was started.  After rewarming he was noted to have seizure activity.  Clinical exam was suggestive of severe anoxic brain injury.  Neurology was consulted.  Family was updated about grim prognosis for meaningful recover.  Patient was made DNR and transitioned to comfort care.  Ventilatory support was withdrawn and the patient expired on August 02, 2013 at 1:30 pm.  Final diagnoses: 1) Cardiac arrest from ventricular tachycardia 2) STEMI 3) Acute respiratory failure 4) Cardiogenic shock 5) Anoxic encephalopathy 6) Generalized non-convulsive status epilepticus 7) Coronary artery disease 8) Hypokalemia 9) Hyperglycemia 10) Metabolic acidosis 11) Hx of prostate cancer 12) Aspiration pneumonia with Klebsiella 13) Anemia 14) Hyponatremia 15) Acute renal failure 16) Klebsiella UTI  Coralyn Helling, MD 07/26/2013, 2:29 PM

## 2013-07-27 LAB — CULTURE, BLOOD (ROUTINE X 2): Culture: NO GROWTH

## 2013-08-12 NOTE — Progress Notes (Signed)
Morphine 90 ml wasted in sink and Ativan 30 ml wasted in sink with second nurse Bernette Mayers RN witness.

## 2013-08-12 NOTE — Progress Notes (Signed)
Family at bedside visiting pt; long discussion with Dr. Craige Cotta with decision to withdraw ETT once remaining family arrive this am; family wishes to provide comfort care only; questions encouraged and answered; will page MD upon family arrival

## 2013-08-12 NOTE — Significant Event (Signed)
Family ready to proceed with withdrawal of medical support.  Orders placed.  Coralyn Helling, MD [facility] Pulmonary/Critical Care 08/05/2013, 11:28 AM Pager:  [phone] After 3pm call: [phone]

## 2013-08-12 NOTE — Progress Notes (Signed)
Met with pt's family at bedside.  Discussed pt's current status.  They understand that he has dismal neurologic prognosis.  They are waiting for additional family to arrive, and then will transition to comfort measures.  Reviewed with family members process for withdrawal of ventilator support, and that dying process is difficult to predict.  He may need transfer to palliative care unit after extubation, depending on progression of dying process.  Coralyn Helling, MD [facility] Pulmonary/Critical Care 07/15/2013, 10:28 AM Pager:  [phone] After 3pm call: [phone]

## 2013-08-12 NOTE — Progress Notes (Signed)
Pt extubated with family at bedside; iv morphine/ativan infusing per protocol; pt HR decreasing; questions encouraged and answered; will continue to monitor

## 2013-08-12 NOTE — Progress Notes (Signed)
Absence of heart and lung sounds auscultated by RN x 2; pt asystole on monitor; family at bedside; questions encouraged and answered

## 2013-08-12 NOTE — Progress Notes (Signed)
Chaplain responded to referral from nurse concerning withdrawal of care. Pt's wife and two daughters were present. Wife said they were doing all right but could use prayer. Chaplain offered prayer for comfort and strength. Family expressed thanks.

## 2013-08-12 NOTE — Procedures (Signed)
Extubation Procedure Note  Patient Details:   Name: [name] DOB: 1936-01-26 MRN: [id number]   Airway Documentation:     Evaluation  O2 sats: currently acceptable Complications: No apparent complications Patient did tolerate procedure well. Bilateral Breath Sounds: Rhonchi Suctioning: Airway Pt extubated to room air at this time per withdrawal of life sustaining treatment protocol. No complications. RT will continue to monitor.   Loyal Jacobson [facility] 08/01/2013, 1:21 PM

## 2013-08-12 DEATH — deceased

## 2014-07-21 ENCOUNTER — Encounter (HOSPITAL_COMMUNITY): Payer: Self-pay | Admitting: Cardiology

## 2014-12-03 IMAGING — RF DG ESOPHAGUS
18 series · 18 of 18 positions shown · non-contrast
Comparison: None.

CLINICAL DATA: Dysphagia.

ESOPHAGUS/BARIUM SWALLOW/TABLET STUDY
Fluoroscopy Time: 1 minute, 22 seconds.

[Series 1: run · 1 of 1 slices shown (1 of 18)]
[im 1/1]
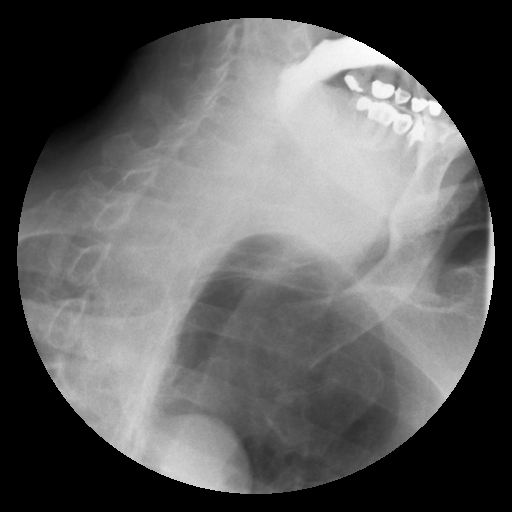

[Series 2: run · 1 of 1 slices shown (2 of 18)]
[im 1/1]
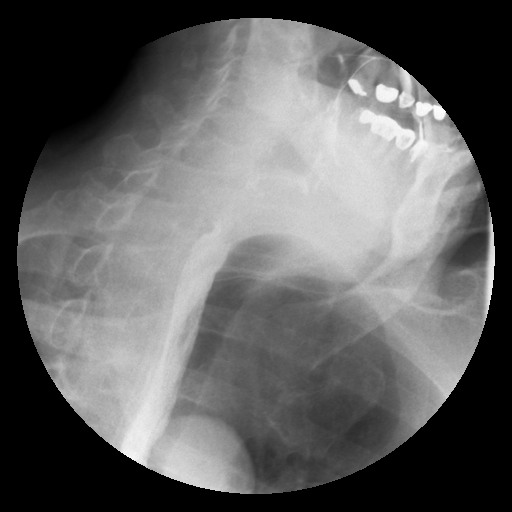

[Series 3: run · 1 of 1 slices shown (3 of 18)]
[im 1/1]
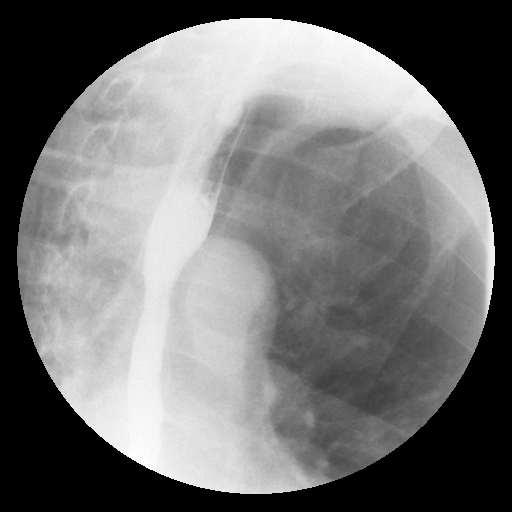

[Series 4: run · 1 of 1 slices shown (4 of 18)]
[im 1/1]
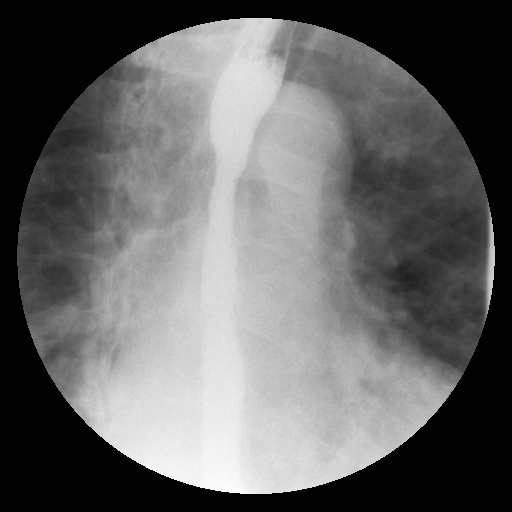

[Series 5: run · 1 of 1 slices shown (5 of 18)]
[im 1/1]
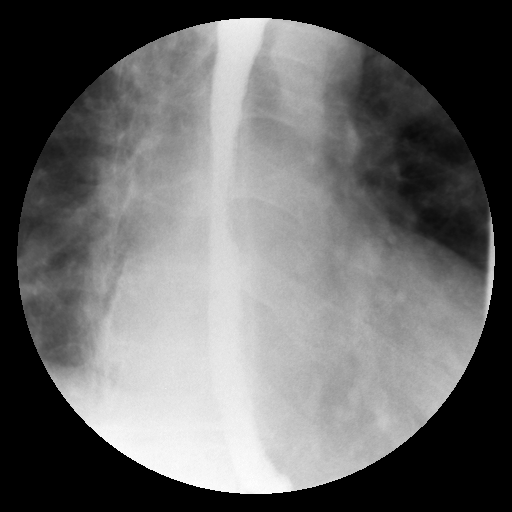

[Series 6: run · 1 of 1 slices shown (6 of 18)]
[im 1/1]
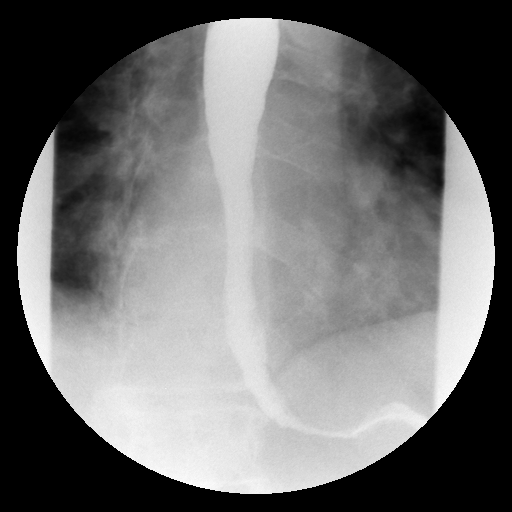

[Series 7: run · 1 of 1 slices shown (7 of 18)]
[im 1/1]
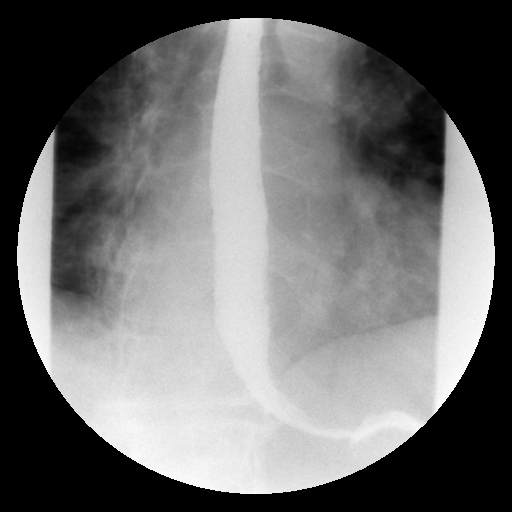

[Series 8: run · 1 of 1 slices shown (8 of 18)]
[im 1/1]
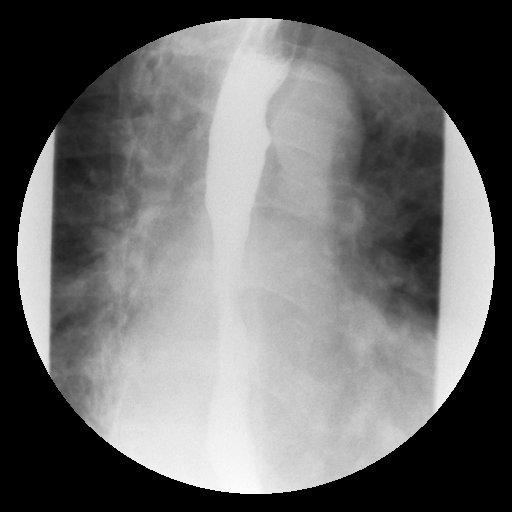

[Series 9: run · 1 of 1 slices shown (9 of 18)]
[im 1/1]
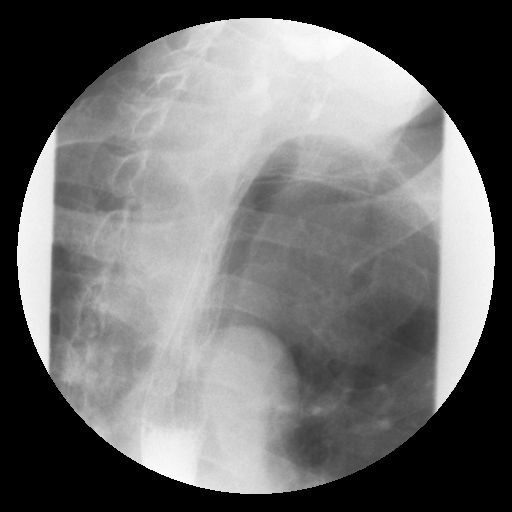

[Series 10: run · 1 of 1 slices shown (10 of 18)]
[im 1/1]
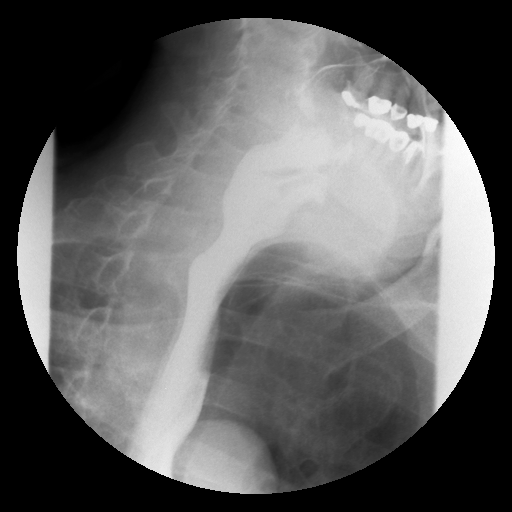

[Series 11: run · 1 of 1 slices shown (11 of 18)]
[im 1/1]
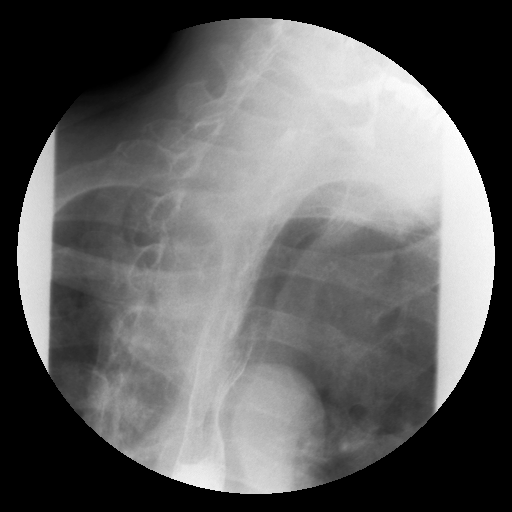

[Series 12: run · 1 of 1 slices shown (12 of 18)]
[im 1/1]
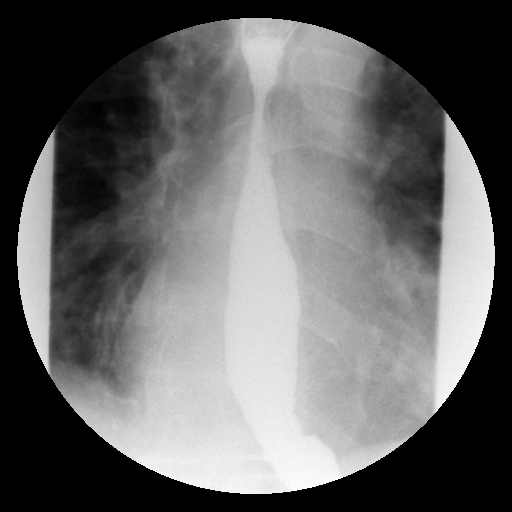

[Series 13: run · 1 of 1 slices shown (13 of 18)]
[im 1/1]
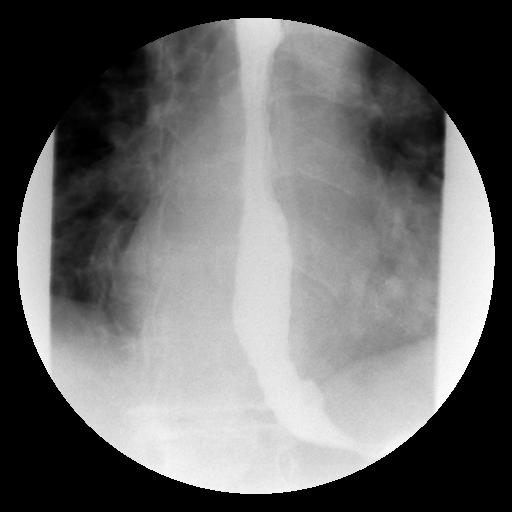

[Series 14: run · 1 of 1 slices shown (14 of 18)]
[im 1/1]
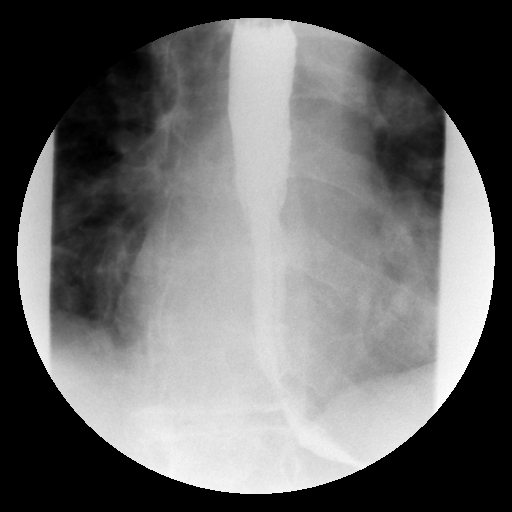

[Series 15: run · 1 of 1 slices shown (15 of 18)]
[im 1/1]
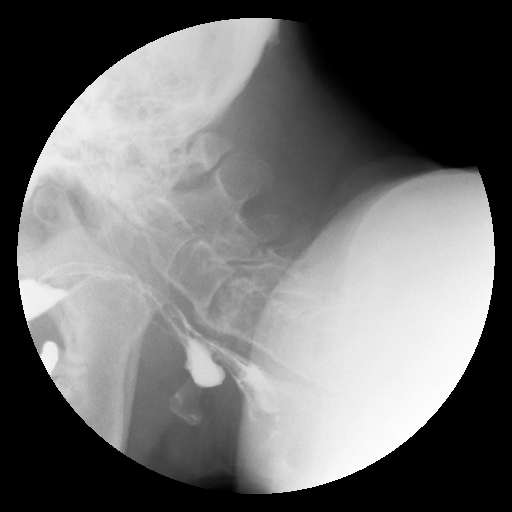

[Series 16: run · 1 of 1 slices shown (16 of 18)]
[im 1/1]
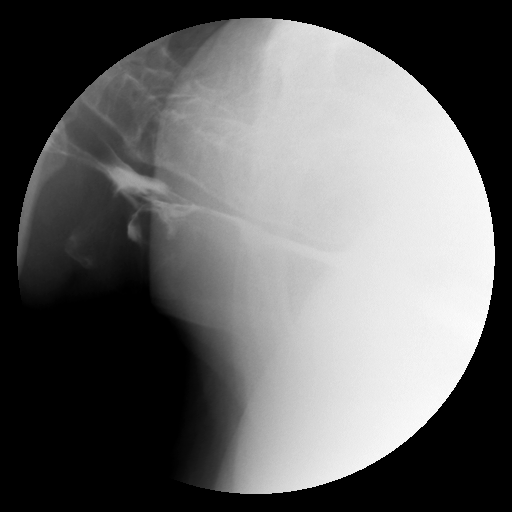

[Series 17: run · 1 of 1 slices shown (17 of 18)]
[im 1/1]
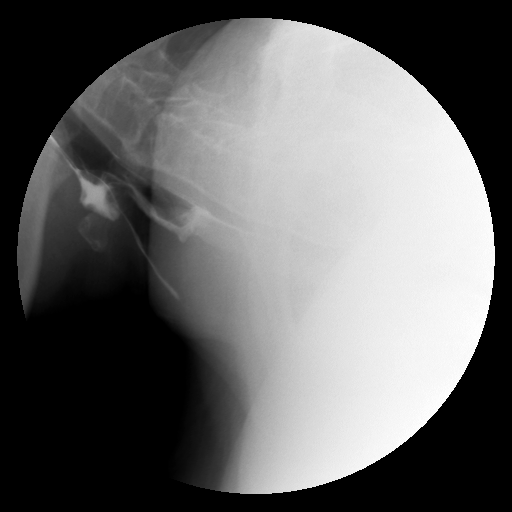

[Series 18: run · 1 of 1 slices shown (18 of 18)]
[im 1/1]
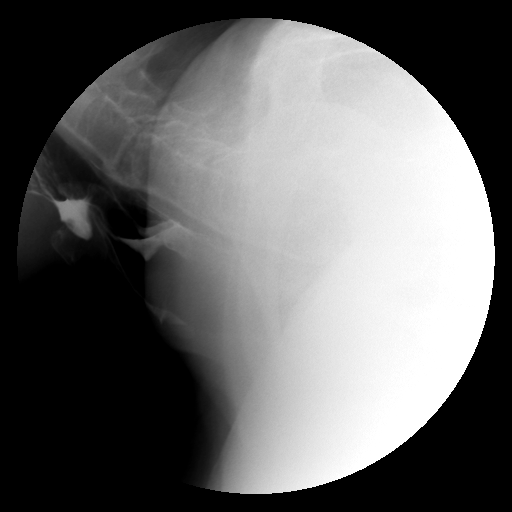

[18 of 18 positions shown; findings below may reference images not displayed]

FINDINGS: Fluoroscopic evaluation of swallow is performed in the
upright position.  During the upright portion of the study, it was
noted that the patient was having silent aspiration to and below
the level of the cords.  Limited evaluation within the esophagus
demonstrates no fixed stricture.  There are occasional tertiary
contractions.  No visible fold thickening.  The study was
terminated due to the solid aspiration.
IMPRESSION: No fixed esophageal stricture.  Tertiary contractions noted.

Silent aspiration.  Recommend speech therapy evaluation.
